# Patient Record
Sex: Female | Born: 1935
Health system: Southern US, Community
[De-identification: ages and names within clinical notes are randomized; demographics above are authoritative.]

## PROBLEM LIST (undated history)

## (undated) DIAGNOSIS — T7840XA Allergy, unspecified, initial encounter: Secondary | ICD-10-CM

## (undated) DIAGNOSIS — K219 Gastro-esophageal reflux disease without esophagitis: Secondary | ICD-10-CM

## (undated) DIAGNOSIS — F0390 Unspecified dementia without behavioral disturbance: Secondary | ICD-10-CM

## (undated) DIAGNOSIS — F419 Anxiety disorder, unspecified: Secondary | ICD-10-CM

## (undated) DIAGNOSIS — M48061 Spinal stenosis, lumbar region without neurogenic claudication: Secondary | ICD-10-CM

## (undated) DIAGNOSIS — C801 Malignant (primary) neoplasm, unspecified: Secondary | ICD-10-CM

## (undated) DIAGNOSIS — E785 Hyperlipidemia, unspecified: Secondary | ICD-10-CM

## (undated) DIAGNOSIS — D649 Anemia, unspecified: Secondary | ICD-10-CM

## (undated) DIAGNOSIS — R42 Dizziness and giddiness: Secondary | ICD-10-CM

## (undated) DIAGNOSIS — K5730 Diverticulosis of large intestine without perforation or abscess without bleeding: Secondary | ICD-10-CM

## (undated) DIAGNOSIS — M1990 Unspecified osteoarthritis, unspecified site: Secondary | ICD-10-CM

## (undated) DIAGNOSIS — I10 Essential (primary) hypertension: Secondary | ICD-10-CM

## (undated) HISTORY — DX: Unspecified dementia without behavioral disturbance: F03.90

## (undated) HISTORY — DX: Essential (primary) hypertension: I10

## (undated) HISTORY — DX: Allergy, unspecified, initial encounter: T78.40XA

## (undated) HISTORY — DX: Anemia, unspecified: D64.9

## (undated) HISTORY — PX: EYE SURGERY: SHX253

## (undated) HISTORY — DX: Hyperlipidemia, unspecified: E78.5

## (undated) HISTORY — DX: Unspecified osteoarthritis, unspecified site: M19.90

## (undated) HISTORY — DX: Gastro-esophageal reflux disease without esophagitis: K21.9

## (undated) HISTORY — PX: CARPAL TUNNEL RELEASE: SHX101

## (undated) HISTORY — DX: Spinal stenosis, lumbar region without neurogenic claudication: M48.061

## (undated) HISTORY — DX: Malignant (primary) neoplasm, unspecified: C80.1

## (undated) HISTORY — DX: Dizziness and giddiness: R42

## (undated) HISTORY — DX: Diverticulosis of large intestine without perforation or abscess without bleeding: K57.30

## (undated) HISTORY — PX: SHOULDER SURGERY: SHX246

---

## 1994-01-17 HISTORY — PX: TYMPANOPLASTY: SHX33

## 1997-03-17 HISTORY — PX: ABDOMINAL HYSTERECTOMY: SHX81

## 1997-08-10 ENCOUNTER — Emergency Department (HOSPITAL_COMMUNITY): Admission: EM | Admit: 1997-08-10 | Discharge: 1997-08-10 | Payer: Self-pay | Admitting: Emergency Medicine

## 1997-11-17 ENCOUNTER — Other Ambulatory Visit: Admission: RE | Admit: 1997-11-17 | Discharge: 1997-11-17 | Payer: Self-pay

## 1998-06-04 ENCOUNTER — Other Ambulatory Visit: Admission: RE | Admit: 1998-06-04 | Discharge: 1998-06-04 | Payer: Self-pay | Admitting: Obstetrics and Gynecology

## 1998-11-11 ENCOUNTER — Encounter: Payer: Self-pay | Admitting: Family Medicine

## 1998-11-11 ENCOUNTER — Encounter: Admission: RE | Admit: 1998-11-11 | Discharge: 1998-11-11 | Payer: Self-pay | Admitting: Family Medicine

## 1998-11-24 ENCOUNTER — Encounter: Admission: RE | Admit: 1998-11-24 | Discharge: 1998-11-24 | Payer: Self-pay | Admitting: Family Medicine

## 1998-11-24 ENCOUNTER — Encounter: Payer: Self-pay | Admitting: Family Medicine

## 1998-11-30 ENCOUNTER — Other Ambulatory Visit: Admission: RE | Admit: 1998-11-30 | Discharge: 1998-11-30 | Payer: Self-pay | Admitting: Obstetrics and Gynecology

## 1998-12-02 ENCOUNTER — Encounter (INDEPENDENT_AMBULATORY_CARE_PROVIDER_SITE_OTHER): Payer: Self-pay

## 1998-12-02 ENCOUNTER — Encounter: Payer: Self-pay | Admitting: Family Medicine

## 1998-12-02 ENCOUNTER — Ambulatory Visit (HOSPITAL_COMMUNITY): Admission: RE | Admit: 1998-12-02 | Discharge: 1998-12-02 | Payer: Self-pay | Admitting: Family Medicine

## 1998-12-18 DIAGNOSIS — Z853 Personal history of malignant neoplasm of breast: Secondary | ICD-10-CM

## 1998-12-25 ENCOUNTER — Encounter: Admission: RE | Admit: 1998-12-25 | Discharge: 1998-12-25 | Payer: Self-pay | Admitting: Surgery

## 1998-12-25 ENCOUNTER — Encounter: Payer: Self-pay | Admitting: Surgery

## 1998-12-28 ENCOUNTER — Ambulatory Visit (HOSPITAL_BASED_OUTPATIENT_CLINIC_OR_DEPARTMENT_OTHER): Admission: RE | Admit: 1998-12-28 | Discharge: 1998-12-28 | Payer: Self-pay | Admitting: Surgery

## 1998-12-28 ENCOUNTER — Encounter: Payer: Self-pay | Admitting: Surgery

## 1998-12-28 HISTORY — PX: MASTECTOMY PARTIAL / LUMPECTOMY: SUR851

## 1999-01-08 ENCOUNTER — Encounter (INDEPENDENT_AMBULATORY_CARE_PROVIDER_SITE_OTHER): Payer: Self-pay

## 1999-01-08 ENCOUNTER — Ambulatory Visit (HOSPITAL_BASED_OUTPATIENT_CLINIC_OR_DEPARTMENT_OTHER): Admission: RE | Admit: 1999-01-08 | Discharge: 1999-01-08 | Payer: Self-pay | Admitting: Surgery

## 1999-01-19 ENCOUNTER — Encounter: Admission: RE | Admit: 1999-01-19 | Discharge: 1999-04-19 | Payer: Self-pay | Admitting: Radiation Oncology

## 1999-02-11 ENCOUNTER — Ambulatory Visit (HOSPITAL_COMMUNITY): Admission: RE | Admit: 1999-02-11 | Discharge: 1999-02-11 | Payer: Self-pay | Admitting: Radiation Oncology

## 1999-06-03 ENCOUNTER — Other Ambulatory Visit: Admission: RE | Admit: 1999-06-03 | Discharge: 1999-06-03 | Payer: Self-pay | Admitting: Obstetrics and Gynecology

## 1999-08-12 ENCOUNTER — Encounter: Admission: RE | Admit: 1999-08-12 | Discharge: 1999-08-12 | Payer: Self-pay | Admitting: Surgery

## 1999-08-12 ENCOUNTER — Encounter: Payer: Self-pay | Admitting: Surgery

## 2000-01-03 ENCOUNTER — Other Ambulatory Visit: Admission: RE | Admit: 2000-01-03 | Discharge: 2000-01-03 | Payer: Self-pay | Admitting: Obstetrics and Gynecology

## 2000-02-23 ENCOUNTER — Encounter: Admission: RE | Admit: 2000-02-23 | Discharge: 2000-05-23 | Payer: Self-pay | Admitting: Family Medicine

## 2000-07-21 ENCOUNTER — Other Ambulatory Visit: Admission: RE | Admit: 2000-07-21 | Discharge: 2000-07-21 | Payer: Self-pay | Admitting: Obstetrics and Gynecology

## 2000-08-15 ENCOUNTER — Encounter: Payer: Self-pay | Admitting: Family Medicine

## 2000-08-15 ENCOUNTER — Encounter: Admission: RE | Admit: 2000-08-15 | Discharge: 2000-08-15 | Payer: Self-pay | Admitting: Family Medicine

## 2000-08-17 ENCOUNTER — Encounter: Payer: Self-pay | Admitting: Family Medicine

## 2000-08-17 ENCOUNTER — Encounter: Admission: RE | Admit: 2000-08-17 | Discharge: 2000-08-17 | Payer: Self-pay | Admitting: Family Medicine

## 2000-08-28 ENCOUNTER — Encounter: Admission: RE | Admit: 2000-08-28 | Discharge: 2000-08-28 | Payer: Self-pay | Admitting: Family Medicine

## 2000-08-28 ENCOUNTER — Encounter: Payer: Self-pay | Admitting: Family Medicine

## 2001-01-24 ENCOUNTER — Other Ambulatory Visit: Admission: RE | Admit: 2001-01-24 | Discharge: 2001-01-24 | Payer: Self-pay | Admitting: Obstetrics and Gynecology

## 2001-04-17 ENCOUNTER — Encounter: Admission: RE | Admit: 2001-04-17 | Discharge: 2001-07-16 | Payer: Self-pay | Admitting: Family Medicine

## 2001-05-21 ENCOUNTER — Ambulatory Visit (HOSPITAL_COMMUNITY): Admission: RE | Admit: 2001-05-21 | Discharge: 2001-05-21 | Payer: Self-pay | Admitting: Internal Medicine

## 2001-05-21 ENCOUNTER — Encounter: Payer: Self-pay | Admitting: Internal Medicine

## 2001-05-24 ENCOUNTER — Ambulatory Visit (HOSPITAL_COMMUNITY): Admission: RE | Admit: 2001-05-24 | Discharge: 2001-05-24 | Payer: Self-pay | Admitting: Internal Medicine

## 2001-05-28 ENCOUNTER — Encounter: Payer: Self-pay | Admitting: Internal Medicine

## 2001-05-28 ENCOUNTER — Ambulatory Visit (HOSPITAL_COMMUNITY): Admission: RE | Admit: 2001-05-28 | Discharge: 2001-05-28 | Payer: Self-pay | Admitting: Internal Medicine

## 2001-07-25 ENCOUNTER — Other Ambulatory Visit: Admission: RE | Admit: 2001-07-25 | Discharge: 2001-07-25 | Payer: Self-pay | Admitting: Obstetrics and Gynecology

## 2001-08-16 ENCOUNTER — Encounter: Payer: Self-pay | Admitting: Family Medicine

## 2001-08-16 ENCOUNTER — Encounter: Admission: RE | Admit: 2001-08-16 | Discharge: 2001-08-16 | Payer: Self-pay | Admitting: Family Medicine

## 2002-02-20 ENCOUNTER — Other Ambulatory Visit: Admission: RE | Admit: 2002-02-20 | Discharge: 2002-02-20 | Payer: Self-pay | Admitting: Obstetrics and Gynecology

## 2002-03-20 ENCOUNTER — Encounter: Payer: Self-pay | Admitting: Orthopedic Surgery

## 2002-03-20 ENCOUNTER — Encounter: Admission: RE | Admit: 2002-03-20 | Discharge: 2002-03-20 | Payer: Self-pay | Admitting: Orthopedic Surgery

## 2002-03-21 ENCOUNTER — Ambulatory Visit (HOSPITAL_BASED_OUTPATIENT_CLINIC_OR_DEPARTMENT_OTHER): Admission: RE | Admit: 2002-03-21 | Discharge: 2002-03-22 | Payer: Self-pay | Admitting: Orthopedic Surgery

## 2002-03-21 ENCOUNTER — Encounter (INDEPENDENT_AMBULATORY_CARE_PROVIDER_SITE_OTHER): Payer: Self-pay | Admitting: *Deleted

## 2002-06-20 ENCOUNTER — Ambulatory Visit (HOSPITAL_BASED_OUTPATIENT_CLINIC_OR_DEPARTMENT_OTHER): Admission: RE | Admit: 2002-06-20 | Discharge: 2002-06-20 | Payer: Self-pay | Admitting: Orthopedic Surgery

## 2003-02-04 ENCOUNTER — Encounter: Admission: RE | Admit: 2003-02-04 | Discharge: 2003-02-04 | Payer: Self-pay | Admitting: Family Medicine

## 2003-06-23 ENCOUNTER — Encounter: Admission: RE | Admit: 2003-06-23 | Discharge: 2003-06-23 | Payer: Self-pay | Admitting: Family Medicine

## 2003-09-04 ENCOUNTER — Other Ambulatory Visit: Admission: RE | Admit: 2003-09-04 | Discharge: 2003-09-04 | Payer: Self-pay | Admitting: Obstetrics and Gynecology

## 2003-09-10 ENCOUNTER — Emergency Department (HOSPITAL_COMMUNITY): Admission: AC | Admit: 2003-09-10 | Discharge: 2003-09-10 | Payer: Self-pay

## 2004-01-13 ENCOUNTER — Ambulatory Visit: Payer: Self-pay | Admitting: Family Medicine

## 2004-02-11 ENCOUNTER — Ambulatory Visit: Payer: Self-pay | Admitting: Family Medicine

## 2004-06-01 ENCOUNTER — Encounter: Admission: RE | Admit: 2004-06-01 | Discharge: 2004-06-01 | Payer: Self-pay | Admitting: Family Medicine

## 2004-06-01 ENCOUNTER — Ambulatory Visit: Payer: Self-pay | Admitting: Family Medicine

## 2004-06-15 ENCOUNTER — Encounter: Payer: Self-pay | Admitting: Family Medicine

## 2004-06-29 ENCOUNTER — Ambulatory Visit: Payer: Self-pay | Admitting: Family Medicine

## 2004-08-05 ENCOUNTER — Ambulatory Visit: Payer: Self-pay | Admitting: Family Medicine

## 2004-08-09 ENCOUNTER — Ambulatory Visit: Payer: Self-pay | Admitting: Family Medicine

## 2004-08-11 ENCOUNTER — Ambulatory Visit: Payer: Self-pay | Admitting: Family Medicine

## 2004-08-16 ENCOUNTER — Encounter: Admission: RE | Admit: 2004-08-16 | Discharge: 2004-08-16 | Payer: Self-pay | Admitting: Family Medicine

## 2004-08-19 ENCOUNTER — Ambulatory Visit: Payer: Self-pay | Admitting: Family Medicine

## 2004-08-25 ENCOUNTER — Encounter: Admission: RE | Admit: 2004-08-25 | Discharge: 2004-08-25 | Payer: Self-pay | Admitting: Family Medicine

## 2004-09-06 ENCOUNTER — Other Ambulatory Visit: Admission: RE | Admit: 2004-09-06 | Discharge: 2004-09-06 | Payer: Self-pay | Admitting: Obstetrics and Gynecology

## 2004-10-18 ENCOUNTER — Ambulatory Visit: Payer: Self-pay | Admitting: Family Medicine

## 2004-10-22 ENCOUNTER — Encounter: Admission: RE | Admit: 2004-10-22 | Discharge: 2004-10-22 | Payer: Self-pay | Admitting: Family Medicine

## 2004-11-09 ENCOUNTER — Ambulatory Visit: Payer: Self-pay | Admitting: Family Medicine

## 2004-12-15 ENCOUNTER — Ambulatory Visit: Payer: Self-pay | Admitting: Family Medicine

## 2004-12-29 ENCOUNTER — Ambulatory Visit: Payer: Self-pay | Admitting: Family Medicine

## 2005-03-31 ENCOUNTER — Ambulatory Visit: Payer: Self-pay | Admitting: Family Medicine

## 2005-07-12 ENCOUNTER — Ambulatory Visit: Payer: Self-pay | Admitting: Family Medicine

## 2005-08-02 ENCOUNTER — Ambulatory Visit: Payer: Self-pay | Admitting: Family Medicine

## 2005-08-11 ENCOUNTER — Ambulatory Visit: Payer: Self-pay | Admitting: Family Medicine

## 2005-09-06 ENCOUNTER — Encounter: Admission: RE | Admit: 2005-09-06 | Discharge: 2005-09-06 | Payer: Self-pay | Admitting: Family Medicine

## 2005-09-15 ENCOUNTER — Other Ambulatory Visit: Admission: RE | Admit: 2005-09-15 | Discharge: 2005-09-15 | Payer: Self-pay | Admitting: Obstetrics and Gynecology

## 2005-11-03 ENCOUNTER — Ambulatory Visit: Payer: Self-pay | Admitting: Family Medicine

## 2005-11-24 ENCOUNTER — Ambulatory Visit: Payer: Self-pay | Admitting: Internal Medicine

## 2006-02-01 ENCOUNTER — Ambulatory Visit: Payer: Self-pay | Admitting: Family Medicine

## 2006-02-01 LAB — CONVERTED CEMR LAB
ALT: 25 units/L (ref 0–40)
AST: 26 units/L (ref 0–37)
Albumin: 3.8 g/dL (ref 3.5–5.2)
BUN: 8 mg/dL (ref 6–23)
CO2: 30 meq/L (ref 19–32)
Calcium: 9.6 mg/dL (ref 8.4–10.5)
Chloride: 96 meq/L (ref 96–112)
Cholesterol: 175 mg/dL (ref 0–200)
Creatinine, Ser: 0.9 mg/dL (ref 0.4–1.2)
GFR calc Af Amer: 78 mL/min
GFR calc non Af Amer: 65 mL/min
Glucose, Bld: 104 mg/dL — ABNORMAL HIGH (ref 70–99)
HDL: 37.6 mg/dL — ABNORMAL LOW (ref >39.0)
Hgb A1c MFr Bld: 6.6 %
Hgb A1c MFr Bld: 6.6 % — ABNORMAL HIGH (ref 4.6–6.0)
LDL Cholesterol: 112 mg/dL — ABNORMAL HIGH (ref 0–99)
Phosphorus: 3 mg/dL (ref 2.3–4.6)
Potassium: 3.9 meq/L (ref 3.5–5.1)
Sodium: 133 meq/L — ABNORMAL LOW (ref 135–145)
Total CHOL/HDL Ratio: 4.7
Triglycerides: 127 mg/dL (ref 0–149)
VLDL: 25 mg/dL (ref 0–40)

## 2006-03-08 ENCOUNTER — Ambulatory Visit: Payer: Self-pay | Admitting: Family Medicine

## 2006-07-10 ENCOUNTER — Ambulatory Visit: Payer: Self-pay | Admitting: Internal Medicine

## 2006-07-10 DIAGNOSIS — Z8739 Personal history of other diseases of the musculoskeletal system and connective tissue: Secondary | ICD-10-CM

## 2006-07-11 ENCOUNTER — Encounter: Payer: Self-pay | Admitting: Family Medicine

## 2006-07-11 DIAGNOSIS — E785 Hyperlipidemia, unspecified: Secondary | ICD-10-CM

## 2006-07-11 DIAGNOSIS — K219 Gastro-esophageal reflux disease without esophagitis: Secondary | ICD-10-CM

## 2006-07-11 DIAGNOSIS — K5730 Diverticulosis of large intestine without perforation or abscess without bleeding: Secondary | ICD-10-CM | POA: Insufficient documentation

## 2006-07-11 DIAGNOSIS — E119 Type 2 diabetes mellitus without complications: Secondary | ICD-10-CM | POA: Insufficient documentation

## 2006-07-11 DIAGNOSIS — H269 Unspecified cataract: Secondary | ICD-10-CM

## 2006-07-11 DIAGNOSIS — I10 Essential (primary) hypertension: Secondary | ICD-10-CM

## 2006-07-11 DIAGNOSIS — M1990 Unspecified osteoarthritis, unspecified site: Secondary | ICD-10-CM | POA: Insufficient documentation

## 2006-07-11 DIAGNOSIS — J309 Allergic rhinitis, unspecified: Secondary | ICD-10-CM | POA: Insufficient documentation

## 2006-07-11 DIAGNOSIS — J45909 Unspecified asthma, uncomplicated: Secondary | ICD-10-CM | POA: Insufficient documentation

## 2006-07-19 ENCOUNTER — Ambulatory Visit: Payer: Self-pay | Admitting: Family Medicine

## 2006-07-19 DIAGNOSIS — M129 Arthropathy, unspecified: Secondary | ICD-10-CM | POA: Insufficient documentation

## 2006-07-24 LAB — CONVERTED CEMR LAB: Sed Rate: 42 mm/hr — ABNORMAL HIGH (ref 0–25)

## 2006-07-27 ENCOUNTER — Telehealth (INDEPENDENT_AMBULATORY_CARE_PROVIDER_SITE_OTHER): Payer: Self-pay | Admitting: *Deleted

## 2006-08-07 ENCOUNTER — Telehealth (INDEPENDENT_AMBULATORY_CARE_PROVIDER_SITE_OTHER): Payer: Self-pay | Admitting: *Deleted

## 2006-08-22 ENCOUNTER — Ambulatory Visit: Payer: Self-pay | Admitting: Family Medicine

## 2006-08-22 DIAGNOSIS — M353 Polymyalgia rheumatica: Secondary | ICD-10-CM | POA: Insufficient documentation

## 2006-09-08 ENCOUNTER — Encounter: Payer: Self-pay | Admitting: Family Medicine

## 2006-09-14 ENCOUNTER — Encounter: Payer: Self-pay | Admitting: Family Medicine

## 2006-09-19 ENCOUNTER — Encounter: Payer: Self-pay | Admitting: Family Medicine

## 2006-09-22 ENCOUNTER — Encounter: Admission: RE | Admit: 2006-09-22 | Discharge: 2006-10-20 | Payer: Self-pay | Admitting: Neurology

## 2006-09-27 ENCOUNTER — Ambulatory Visit: Payer: Self-pay | Admitting: Family Medicine

## 2006-09-28 LAB — CONVERTED CEMR LAB: Sed Rate: 29 mm/hr — ABNORMAL HIGH (ref 0–25)

## 2006-11-08 ENCOUNTER — Ambulatory Visit: Payer: Self-pay | Admitting: Family Medicine

## 2006-12-11 ENCOUNTER — Encounter: Payer: Self-pay | Admitting: Family Medicine

## 2007-01-18 HISTORY — PX: BACK SURGERY: SHX140

## 2007-02-20 ENCOUNTER — Ambulatory Visit: Payer: Self-pay | Admitting: Family Medicine

## 2007-02-20 DIAGNOSIS — R42 Dizziness and giddiness: Secondary | ICD-10-CM

## 2007-02-23 LAB — CONVERTED CEMR LAB
Hgb A1c MFr Bld: 6.7 % — ABNORMAL HIGH (ref 4.6–6.0)
Sed Rate: 22 mm/hr (ref 0–25)

## 2007-04-24 ENCOUNTER — Ambulatory Visit: Payer: Self-pay | Admitting: Family Medicine

## 2007-05-18 DIAGNOSIS — M48061 Spinal stenosis, lumbar region without neurogenic claudication: Secondary | ICD-10-CM

## 2007-05-18 HISTORY — DX: Spinal stenosis, lumbar region without neurogenic claudication: M48.061

## 2007-05-31 ENCOUNTER — Encounter: Payer: Self-pay | Admitting: Family Medicine

## 2007-06-14 ENCOUNTER — Encounter: Payer: Self-pay | Admitting: Family Medicine

## 2007-06-18 ENCOUNTER — Ambulatory Visit: Payer: Self-pay | Admitting: Family Medicine

## 2007-07-02 ENCOUNTER — Ambulatory Visit: Payer: Self-pay | Admitting: Family Medicine

## 2007-07-27 ENCOUNTER — Encounter: Payer: Self-pay | Admitting: Family Medicine

## 2007-08-02 ENCOUNTER — Inpatient Hospital Stay (HOSPITAL_COMMUNITY): Admission: RE | Admit: 2007-08-02 | Discharge: 2007-08-06 | Payer: Self-pay | Admitting: Specialist

## 2007-08-02 ENCOUNTER — Encounter (INDEPENDENT_AMBULATORY_CARE_PROVIDER_SITE_OTHER): Payer: Self-pay | Admitting: Specialist

## 2007-08-06 ENCOUNTER — Encounter: Payer: Self-pay | Admitting: Family Medicine

## 2007-08-22 ENCOUNTER — Ambulatory Visit: Payer: Self-pay | Admitting: Family Medicine

## 2007-09-07 ENCOUNTER — Telehealth: Payer: Self-pay | Admitting: Family Medicine

## 2007-09-17 ENCOUNTER — Ambulatory Visit: Payer: Self-pay | Admitting: Family Medicine

## 2007-09-17 DIAGNOSIS — F0390 Unspecified dementia without behavioral disturbance: Secondary | ICD-10-CM | POA: Insufficient documentation

## 2007-09-18 ENCOUNTER — Telehealth: Payer: Self-pay | Admitting: Family Medicine

## 2007-09-19 ENCOUNTER — Encounter: Payer: Self-pay | Admitting: Family Medicine

## 2007-09-19 LAB — CONVERTED CEMR LAB: Vit D, 1,25-Dihydroxy: 70 (ref 30–89)

## 2007-09-26 ENCOUNTER — Ambulatory Visit: Payer: Self-pay | Admitting: Family Medicine

## 2007-09-26 DIAGNOSIS — D649 Anemia, unspecified: Secondary | ICD-10-CM | POA: Insufficient documentation

## 2007-09-26 LAB — CONVERTED CEMR LAB
Albumin: 4.1 g/dL (ref 3.5–5.2)
Basophils Absolute: 0 10*3/uL (ref 0.0–0.1)
CO2: 29 meq/L (ref 19–32)
Chloride: 104 meq/L (ref 96–112)
Eosinophils Absolute: 0.3 10*3/uL (ref 0.0–0.7)
Folate: 15.1 ng/mL
GFR calc non Af Amer: 86 mL/min
HCT: 31.6 % — ABNORMAL LOW (ref 36.0–46.0)
Iron: 33 ug/dL — ABNORMAL LOW (ref 42–145)
MCV: 94.6 fL (ref 78.0–100.0)
Monocytes Absolute: 0.5 10*3/uL (ref 0.1–1.0)
Platelets: 253 10*3/uL (ref 150–400)
RDW: 12.2 % (ref 11.5–14.6)
Saturation Ratios: 9.6 % — ABNORMAL LOW (ref 20.0–50.0)
Sed Rate: 32 mm/hr — ABNORMAL HIGH (ref 0–22)
Sodium: 137 meq/L (ref 135–145)
Vitamin B-12: 250 pg/mL (ref 211–911)

## 2007-10-04 ENCOUNTER — Ambulatory Visit: Payer: Self-pay | Admitting: Family Medicine

## 2007-10-04 LAB — CONVERTED CEMR LAB
OCCULT 1: NEGATIVE
OCCULT 2: NEGATIVE
OCCULT 3: NEGATIVE

## 2007-11-06 ENCOUNTER — Telehealth: Payer: Self-pay | Admitting: Family Medicine

## 2007-11-13 ENCOUNTER — Encounter (INDEPENDENT_AMBULATORY_CARE_PROVIDER_SITE_OTHER): Payer: Self-pay | Admitting: *Deleted

## 2007-11-19 ENCOUNTER — Encounter: Payer: Self-pay | Admitting: Family Medicine

## 2007-11-20 ENCOUNTER — Encounter (INDEPENDENT_AMBULATORY_CARE_PROVIDER_SITE_OTHER): Payer: Self-pay | Admitting: *Deleted

## 2007-11-22 ENCOUNTER — Ambulatory Visit: Payer: Self-pay | Admitting: Family Medicine

## 2007-11-26 LAB — CONVERTED CEMR LAB
Basophils Absolute: 0.1 10*3/uL (ref 0.0–0.1)
Basophils Relative: 1.6 % (ref 0.0–3.0)
Eosinophils Relative: 5 % (ref 0.0–5.0)
Hemoglobin: 12.9 g/dL (ref 12.0–15.0)
Lymphocytes Relative: 26.4 % (ref 12.0–46.0)
MCHC: 34.8 g/dL (ref 30.0–36.0)
MCV: 94 fL (ref 78.0–100.0)
Neutro Abs: 2.9 10*3/uL (ref 1.4–7.7)
RBC: 3.94 M/uL (ref 3.87–5.11)
WBC: 5.3 10*3/uL (ref 4.5–10.5)

## 2007-12-27 ENCOUNTER — Encounter: Payer: Self-pay | Admitting: Family Medicine

## 2007-12-27 ENCOUNTER — Ambulatory Visit: Payer: Self-pay | Admitting: Internal Medicine

## 2008-02-04 ENCOUNTER — Telehealth: Payer: Self-pay | Admitting: Family Medicine

## 2008-02-08 ENCOUNTER — Telehealth: Payer: Self-pay | Admitting: Family Medicine

## 2008-02-13 ENCOUNTER — Encounter (INDEPENDENT_AMBULATORY_CARE_PROVIDER_SITE_OTHER): Payer: Self-pay | Admitting: *Deleted

## 2008-04-04 ENCOUNTER — Encounter: Payer: Self-pay | Admitting: Family Medicine

## 2008-04-21 ENCOUNTER — Encounter: Admission: RE | Admit: 2008-04-21 | Discharge: 2008-07-20 | Payer: Self-pay | Admitting: Neurology

## 2008-05-22 ENCOUNTER — Telehealth: Payer: Self-pay | Admitting: Family Medicine

## 2008-06-23 ENCOUNTER — Encounter: Payer: Self-pay | Admitting: Family Medicine

## 2008-07-17 LAB — HM DIABETES EYE EXAM: HM Diabetic Eye Exam: NORMAL

## 2008-07-18 ENCOUNTER — Telehealth: Payer: Self-pay | Admitting: Family Medicine

## 2008-11-14 ENCOUNTER — Encounter (INDEPENDENT_AMBULATORY_CARE_PROVIDER_SITE_OTHER): Payer: Self-pay | Admitting: *Deleted

## 2009-01-08 ENCOUNTER — Ambulatory Visit: Payer: Self-pay | Admitting: Family Medicine

## 2009-01-12 LAB — CONVERTED CEMR LAB
Albumin: 4 g/dL (ref 3.5–5.2)
Alkaline Phosphatase: 53 units/L (ref 39–117)
BUN: 11 mg/dL (ref 6–23)
Basophils Absolute: 0.1 10*3/uL (ref 0.0–0.1)
Bilirubin, Direct: 0 mg/dL (ref 0.0–0.3)
Calcium: 9.4 mg/dL (ref 8.4–10.5)
Cholesterol: 192 mg/dL (ref 0–200)
Creatinine, Ser: 0.8 mg/dL (ref 0.4–1.2)
Eosinophils Absolute: 0.1 10*3/uL (ref 0.0–0.7)
Glucose, Bld: 87 mg/dL (ref 70–99)
HDL: 39.1 mg/dL (ref >39.00)
LDL Cholesterol: 125 mg/dL — ABNORMAL HIGH (ref 0–99)
Lymphocytes Relative: 28.3 % (ref 12.0–46.0)
MCHC: 33.9 g/dL (ref 30.0–36.0)
MCV: 96.5 fL (ref 78.0–100.0)
Monocytes Absolute: 0.6 10*3/uL (ref 0.1–1.0)
Neutrophils Relative %: 60.6 % (ref 43.0–77.0)
Phosphorus: 3.7 mg/dL (ref 2.3–4.6)
Platelets: 213 10*3/uL (ref 150.0–400.0)
Potassium: 3.7 meq/L (ref 3.5–5.1)
RDW: 12.6 % (ref 11.5–14.6)
Total Protein: 7.1 g/dL (ref 6.0–8.3)
VLDL: 28.2 mg/dL (ref 0.0–40.0)

## 2009-02-09 ENCOUNTER — Ambulatory Visit: Payer: Self-pay | Admitting: Family Medicine

## 2009-02-10 ENCOUNTER — Encounter: Payer: Self-pay | Admitting: Family Medicine

## 2009-02-19 ENCOUNTER — Encounter: Admission: RE | Admit: 2009-02-19 | Discharge: 2009-03-20 | Payer: Self-pay | Admitting: Neurology

## 2009-03-23 ENCOUNTER — Ambulatory Visit: Payer: Self-pay | Admitting: Family Medicine

## 2009-03-26 LAB — CONVERTED CEMR LAB
Cholesterol: 168 mg/dL (ref 0–200)
HDL: 43.6 mg/dL (ref >39.00)
LDL Cholesterol: 92 mg/dL (ref 0–99)
Total CHOL/HDL Ratio: 4
Triglycerides: 161 mg/dL — ABNORMAL HIGH (ref 0.0–149.0)

## 2009-04-16 ENCOUNTER — Ambulatory Visit: Payer: Self-pay | Admitting: Family Medicine

## 2009-04-17 ENCOUNTER — Encounter: Admission: RE | Admit: 2009-04-17 | Discharge: 2009-04-17 | Payer: Self-pay | Admitting: Family Medicine

## 2009-04-20 ENCOUNTER — Encounter (INDEPENDENT_AMBULATORY_CARE_PROVIDER_SITE_OTHER): Payer: Self-pay | Admitting: *Deleted

## 2009-04-21 ENCOUNTER — Telehealth: Payer: Self-pay | Admitting: Family Medicine

## 2009-04-22 ENCOUNTER — Encounter: Payer: Self-pay | Admitting: Family Medicine

## 2009-05-04 ENCOUNTER — Encounter: Payer: Self-pay | Admitting: Family Medicine

## 2009-05-13 ENCOUNTER — Telehealth: Payer: Self-pay | Admitting: Family Medicine

## 2009-05-27 ENCOUNTER — Ambulatory Visit: Payer: Self-pay | Admitting: Family Medicine

## 2009-05-27 ENCOUNTER — Telehealth: Payer: Self-pay | Admitting: Family Medicine

## 2009-06-04 ENCOUNTER — Encounter: Payer: Self-pay | Admitting: Family Medicine

## 2009-06-19 ENCOUNTER — Telehealth: Payer: Self-pay | Admitting: Family Medicine

## 2009-08-06 ENCOUNTER — Ambulatory Visit: Payer: Self-pay | Admitting: Family Medicine

## 2009-08-06 LAB — CONVERTED CEMR LAB
AST: 24 units/L (ref 0–37)
BUN: 14 mg/dL (ref 6–23)
CO2: 32 meq/L (ref 19–32)
Calcium: 9.4 mg/dL (ref 8.4–10.5)
Cholesterol: 198 mg/dL (ref 0–200)
Creatinine, Ser: 0.8 mg/dL (ref 0.4–1.2)
Glucose, Bld: 114 mg/dL — ABNORMAL HIGH (ref 70–99)
LDL Cholesterol: 137 mg/dL — ABNORMAL HIGH (ref 0–99)
Sodium: 141 meq/L (ref 135–145)
Total CHOL/HDL Ratio: 6
Triglycerides: 128 mg/dL (ref 0.0–149.0)
VLDL: 25.6 mg/dL (ref 0.0–40.0)

## 2009-08-07 ENCOUNTER — Telehealth: Payer: Self-pay | Admitting: Family Medicine

## 2009-08-10 ENCOUNTER — Ambulatory Visit: Payer: Self-pay | Admitting: Family Medicine

## 2009-08-10 LAB — HM DIABETES FOOT EXAM

## 2009-08-17 ENCOUNTER — Ambulatory Visit: Admission: RE | Admit: 2009-08-17 | Discharge: 2009-09-28 | Payer: Self-pay | Admitting: Radiation Oncology

## 2009-08-18 ENCOUNTER — Encounter: Payer: Self-pay | Admitting: Family Medicine

## 2009-08-27 ENCOUNTER — Encounter: Admission: RE | Admit: 2009-08-27 | Discharge: 2009-08-27 | Payer: Self-pay | Admitting: Radiation Oncology

## 2009-09-24 ENCOUNTER — Encounter: Payer: Self-pay | Admitting: Family Medicine

## 2009-09-30 ENCOUNTER — Telehealth: Payer: Self-pay | Admitting: Family Medicine

## 2009-10-12 ENCOUNTER — Encounter: Payer: Self-pay | Admitting: Family Medicine

## 2009-10-12 ENCOUNTER — Telehealth: Payer: Self-pay | Admitting: Family Medicine

## 2009-10-16 ENCOUNTER — Ambulatory Visit (HOSPITAL_COMMUNITY): Admission: RE | Admit: 2009-10-16 | Discharge: 2009-10-17 | Payer: Self-pay | Admitting: Surgery

## 2009-10-16 ENCOUNTER — Telehealth: Payer: Self-pay | Admitting: Family Medicine

## 2009-10-16 ENCOUNTER — Encounter (INDEPENDENT_AMBULATORY_CARE_PROVIDER_SITE_OTHER): Payer: Self-pay | Admitting: Surgery

## 2009-10-16 HISTORY — PX: INCISIONAL BREAST BIOPSY: SHX1812

## 2009-10-18 ENCOUNTER — Emergency Department (HOSPITAL_COMMUNITY)
Admission: EM | Admit: 2009-10-18 | Discharge: 2009-10-18 | Payer: Self-pay | Source: Home / Self Care | Admitting: Family Medicine

## 2009-10-20 ENCOUNTER — Emergency Department (HOSPITAL_COMMUNITY): Admission: EM | Admit: 2009-10-20 | Discharge: 2009-10-20 | Payer: Self-pay | Admitting: Family Medicine

## 2009-10-28 ENCOUNTER — Encounter: Payer: Self-pay | Admitting: Family Medicine

## 2009-10-28 ENCOUNTER — Ambulatory Visit (HOSPITAL_COMMUNITY): Admission: RE | Admit: 2009-10-28 | Discharge: 2009-10-28 | Payer: Self-pay | Admitting: Surgery

## 2009-11-05 ENCOUNTER — Ambulatory Visit (HOSPITAL_COMMUNITY): Admission: RE | Admit: 2009-11-05 | Discharge: 2009-11-05 | Payer: Self-pay | Admitting: Surgery

## 2009-11-11 ENCOUNTER — Ambulatory Visit: Payer: Self-pay | Admitting: Oncology

## 2009-11-11 ENCOUNTER — Encounter: Payer: Self-pay | Admitting: Family Medicine

## 2009-11-11 ENCOUNTER — Encounter (INDEPENDENT_AMBULATORY_CARE_PROVIDER_SITE_OTHER): Payer: Self-pay | Admitting: *Deleted

## 2009-11-11 LAB — CBC WITH DIFFERENTIAL/PLATELET
Eosinophils Absolute: 0.1 10*3/uL (ref 0.0–0.5)
MCV: 92.7 fL (ref 79.5–101.0)
MONO%: 11.2 % (ref 0.0–14.0)
NEUT#: 3.1 10*3/uL (ref 1.5–6.5)
RBC: 3.72 10*6/uL (ref 3.70–5.45)
RDW: 13.7 % (ref 11.2–14.5)
WBC: 6.1 10*3/uL (ref 3.9–10.3)
lymph#: 2.2 10*3/uL (ref 0.9–3.3)

## 2009-11-11 LAB — COMPREHENSIVE METABOLIC PANEL
AST: 29 U/L (ref 0–37)
Albumin: 3.8 g/dL (ref 3.5–5.2)
Alkaline Phosphatase: 59 U/L (ref 39–117)
Calcium: 9.1 mg/dL (ref 8.4–10.5)
Chloride: 101 mEq/L (ref 96–112)
Glucose, Bld: 112 mg/dL — ABNORMAL HIGH (ref 70–99)
Potassium: 3.8 mEq/L (ref 3.5–5.3)
Sodium: 137 mEq/L (ref 135–145)
Total Protein: 7.4 g/dL (ref 6.0–8.3)

## 2009-11-11 LAB — CANCER ANTIGEN 27.29: CA 27.29: 26 U/mL (ref 0–39)

## 2009-11-30 ENCOUNTER — Encounter: Payer: Self-pay | Admitting: Family Medicine

## 2009-12-14 ENCOUNTER — Inpatient Hospital Stay (HOSPITAL_COMMUNITY)
Admission: RE | Admit: 2009-12-14 | Discharge: 2009-12-18 | Disposition: A | Discharge status: Other Acute Inpatient Hospital | Payer: Self-pay | Source: Home / Self Care | Admitting: Surgery

## 2009-12-14 ENCOUNTER — Encounter (INDEPENDENT_AMBULATORY_CARE_PROVIDER_SITE_OTHER): Payer: Self-pay | Admitting: Surgery

## 2009-12-14 HISTORY — PX: MASTECTOMY W/ NODES PARTIAL: SUR854

## 2009-12-25 ENCOUNTER — Ambulatory Visit: Payer: Self-pay | Admitting: Oncology

## 2010-01-07 HISTORY — PX: MASTECTOMY PARTIAL / LUMPECTOMY: SUR851

## 2010-01-13 ENCOUNTER — Ambulatory Visit: Payer: Self-pay | Admitting: Family Medicine

## 2010-01-17 DIAGNOSIS — F0390 Unspecified dementia without behavioral disturbance: Secondary | ICD-10-CM

## 2010-01-17 HISTORY — DX: Unspecified dementia, unspecified severity, without behavioral disturbance, psychotic disturbance, mood disturbance, and anxiety: F03.90

## 2010-02-05 ENCOUNTER — Ambulatory Visit: Payer: Self-pay | Admitting: Oncology

## 2010-02-07 ENCOUNTER — Encounter: Payer: Self-pay | Admitting: Oncology

## 2010-02-14 LAB — CONVERTED CEMR LAB
Albumin: 4.2 g/dL (ref 3.5–5.2)
Alkaline Phosphatase: 58 units/L (ref 39–117)
Basophils Relative: 0.9 % (ref 0.0–1.0)
Calcium: 9.8 mg/dL (ref 8.4–10.5)
Chloride: 96 meq/L (ref 96–112)
Creatinine, Ser: 0.9 mg/dL (ref 0.4–1.2)
Eosinophils Absolute: 0.1 10*3/uL (ref 0.0–0.7)
Eosinophils Relative: 0.9 % (ref 0.0–5.0)
GFR calc Af Amer: 78 mL/min
GFR calc non Af Amer: 64 mL/min
HCT: 34.6 % — ABNORMAL LOW (ref 36.0–46.0)
Hemoglobin: 12.4 g/dL (ref 12.0–15.0)
MCV: 94.7 fL (ref 78.0–100.0)
Monocytes Absolute: 0.7 10*3/uL (ref 0.1–1.0)
Neutrophils Relative %: 62.4 % (ref 43.0–77.0)
RBC: 3.66 M/uL — ABNORMAL LOW (ref 3.87–5.11)
Total Protein: 7.4 g/dL (ref 6.0–8.3)
WBC: 5.9 10*3/uL (ref 4.5–10.5)

## 2010-02-16 ENCOUNTER — Encounter: Payer: Self-pay | Admitting: Family Medicine

## 2010-02-16 LAB — CBC WITH DIFFERENTIAL/PLATELET
BASO%: 0.7 % (ref 0.0–2.0)
EOS%: 2.2 % (ref 0.0–7.0)
HCT: 35.2 % (ref 34.8–46.6)
LYMPH%: 29.3 % (ref 14.0–49.7)
MCH: 30.4 pg (ref 25.1–34.0)
MCHC: 34.1 g/dL (ref 31.5–36.0)
MONO%: 9.9 % (ref 0.0–14.0)
NEUT%: 57.9 % (ref 38.4–76.8)
Platelets: 248 10*3/uL (ref 145–400)

## 2010-02-17 ENCOUNTER — Encounter: Payer: Self-pay | Admitting: Oncology

## 2010-02-18 NOTE — Progress Notes (Signed)
Summary: refill request for mobic  Phone Note Refill Request Message from:  Fax from Pharmacy  Refills Requested: Medication #1:  MOBIC 15 MG TABS take one tablet by mouth once daily with a meal as needed for pain.   Last Refilled: 04/22/2009 Faxed request from [place], [phone].  Initial call taken by: Lowella Petties CMA,  May 27, 2009 2:49 PM  Follow-up for Phone Call        can refil times one  Follow-up by: Judith Part MD,  May 27, 2009 3:37 PM  Additional Follow-up for Phone Call Additional follow up Details #1::        Medication phoned to [facility] pharmacy (spoke with Rob) as instructed. Lewanda Rife LPN  May 27, 2009 4:54 PM

## 2010-02-18 NOTE — Letter (Signed)
Summary: Results Follow up Letter  [place] [facility]  [address] [place], [place] [address]   Phone: [phone]  Fax: [phone]    04/20/2009 MRN: [id number]    Courtney Castillo [address] [place], [place]  [address]    Dear [name],  The following are the results of your recent test(s):  Test         Result    Pap Smear:        Normal _____  Not Normal _____ Comments: ______________________________________________________ Cholesterol: LDL(Bad cholesterol):         Your goal is less than:         HDL (Good cholesterol):       Your goal is more than: Comments:  ______________________________________________________ Mammogram:        Normal __X___  Not Normal _____ Comments:  Yearly follow up is recommended.   ___________________________________________________________________ Hemoccult:        Normal _____  Not normal _______ Comments:    _____________________________________________________________________ Other Tests:    We routinely do not discuss normal results over the telephone.  If you desire a copy of the results, or you have any questions about this information we can discuss them at your next office visit.   Sincerely,    Marne A. Milinda Antis, M.D.  MAT:lsf

## 2010-02-18 NOTE — Consult Note (Signed)
Summary: [facility]  [facility]   Imported By: Lanelle Bal 06/24/2009 08:37:11  _____________________________________________________________________  External Attachment:    Type:   Image     Comment:   External Document

## 2010-02-18 NOTE — Miscellaneous (Signed)
Summary: Mobic 15mg  update  Medications Added ADULT ASPIRIN LOW STRENGTH 81 MG  TBDP (ASPIRIN) take one by mouth daily MOBIC 15 MG TABS (MELOXICAM) take one tablet by mouth once daily with a meal as needed for pain       Clinical Lists Changes  Medications: Added new medication of MOBIC 15 MG TABS (MELOXICAM) take one tablet by mouth once daily with a meal as needed for pain Changed medication from ADULT ASPIRIN LOW STRENGTH 81 MG  TBDP (ASPIRIN) take one by mouth daily to ADULT ASPIRIN LOW STRENGTH 81 MG  TBDP (ASPIRIN) take one by mouth daily     Current Allergies: ! SEPTRA DS

## 2010-02-18 NOTE — Letter (Signed)
Summary: [facility]   Imported By: Lanelle Bal 11/26/2009 09:10:51  _____________________________________________________________________  External Attachment:    Type:   Image     Comment:   External Document

## 2010-02-18 NOTE — Assessment & Plan Note (Signed)
Summary: pain in breast x 1 month/ alc   Vital Signs:  Patient profile:   77 year old female Height:      58.25 inches Weight:      159.25 pounds BMI:     33.12 Temp:     98.4 degrees F oral Pulse rate:   68 / minute Pulse rhythm:   regular BP sitting:   138 / 74  (left arm) Cuff size:   regular  Vitals Entered By: Lewanda Rife LPN (April 16, 2009 12:01 PM) CC: Pain in rt breast and under rt arm    History of Present Illness: pain in R breast and under arm -- wakes her up at night aching  uses a medicated powder- ? name of it otc   no rash or masses that she can feel  going on over a month  that is the side of her breast cancer  has been a while since she had a mammogram -- 2007 -- did not know she still needed them  had high grade interductal carcinoma -- with ? lumpectomy times 2 and radiation   does not go to any cancer specialists for years   no numbness or tingling no neck pain no weakness in arm no fever or other symptoms     Allergies: 1)  ! 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 anemia   sdm to skilled nursing facility 8/09- for PT and OT after back sx   orthopedics--Dr Shelle Iron  Past Surgical History: Last updated: 02/12/2008 Carpal tunnel release Cataract extraction Hysterectomy / BSO- stage 1-B adenocarcinoma of endometrium (03/1997) Breast biopsy- high grade inroductal carcinoma Breast surgery x 2- radiation [place]- fatty liver (08/2000) Dexa- osteopenia hip (12/2000)  no change (06/2003) Colonoscopy- divertics, hemorrhoids (05/2001) EGD- GERD, HH, stricture (05/2001) Shoulder surgery CT- head- neg (08/2003) Cutaneous Ciliated cyst (10/2003) MRI   brain-  microvascular changes  (10/2004) Dexa- mixed (10/2005) LP for ? NPH in past- neurology MRI lumbar 5/09- severe spinal stenosis L3-4 and L5-6, with synovial cyst at L3-4 dexa 1/10- osteopenia stable in hip, slt imp at spine  Family History: Last updated: 07/15/2006 Father: deceased- kidney failure, HTN Mother: DM, MI Siblings: 3 sisters  Social History: Last updated: 09/17/2007 Marital Status: Married Children: none Occupation: retired non smoker  no alcohol   Risk Factors: Smoking Status: never (Jul 15, 2006)  Review of Systems General:  Denies chills, fatigue, and fever. Eyes:  Denies blurring and discharge. CV:  Complains of chest pain or discomfort; denies difficulty breathing while lying down, near fainting, palpitations, shortness of breath with exertion, and swelling of feet. Resp:  Denies cough, shortness of breath, and wheezing. GI:  Denies abdominal pain, bloody stools, change in bowel habits, indigestion, loss of appetite, nausea, and vomiting. GU:  Denies dysuria and urinary frequency. MS:  Complains of stiffness; denies joint redness and joint swelling. Derm:  Denies itching, lesion(s), poor wound healing, and rash. Neuro:  Denies numbness, tingling, visual disturbances, and weakness. Psych:  Complains of anxiety. Endo:  Denies cold intolerance, excessive thirst, excessive urination, and heat intolerance. Heme:  Denies abnormal bruising, bleeding, and enlarge lymph nodes.  Physical Exam  General:  somewhat frail appearing elderly female  Head:  normocephalic, atraumatic, and no abnormalities observed.  Eyes:  vision grossly intact, pupils equal, pupils round, pupils reactive to light, and no injection.   Ears:  R ear normal and L ear normal.   Mouth:  pharynx pink and moist.   Neck:  supple with full rom and no masses or thyromegally, no JVD or carotid bruit  Chest Wall:  tender ant and lat R chest wall without crepitice or skin change  Breasts:  R  breast- surgical changes noted with scar tissue under scars and in axilla  changes also from radiatio breast and axilla are mildly and diffusely tender no focal mass noted no skin change or nipple d/c no acute changes in L breast  Lungs:  Normal respiratory effort, chest expands symmetrically. Lungs are clear to auscultation, no crackles or wheezes. Heart:  Normal rate and regular rhythm. S1 and S2 normal without gallop, murmur, click, rub or other extra sounds. Abdomen:  Bowel sounds positive,abdomen soft and non-tender without masses, organomegaly or hernias noted. Msk:  No deformity or scoliosis noted of thoracic or lumbar spine.  poor rom knees and slow gait Extremities:  no CCE Neurologic:  strength normal in all extremities, sensation intact to light touch, gait normal, and DTRs symmetrical and normal.   baseline moves slowly with poor balance - no change in that  Skin:  Intact without suspicious lesions or rashes Cervical Nodes:  No lymphadenopathy noted Psych:  normal affect, talkative and pleasant   Diabetes Management Exam:    Foot Exam (with socks and/or shoes not present):       Sensory-Pinprick/Light touch:          Left medial foot (L-4): normal          Left dorsal foot (L-5): normal          Left lateral foot (S-1): normal          Right medial foot (L-4): normal          Right dorsal foot (L-5): normal          Right lateral foot (S-1): normal       Sensory-Monofilament:          Left foot: normal          Right foot: normal       Inspection:          Left foot: normal          Right foot: normal       Nails:          Left foot: normal          Right foot: normal   Impression & Recommendations:  Problem # 1:  BREAST PAIN, RIGHT (ICD-611.71) Assessment New breast and chest wall pain on R side with hx of ca/ surg and radiation to that side  unfortunately  - pt has not had regular mammograms since 07 will ref for dx mam as well as chest x ray andrib films    adv warm compress and tylenol as needed for pain if needed adv to update if worse or any rash develop- will watch closely for that  will update with results and plan   Orders: Radiology Referral (Radiology) Radiology Referral (Radiology)  Problem # 2:  CHEST WALL PAIN, ACUTE (UJW-119.14) Assessment: New see above  Her updated medication list for this problem includes:    Adult Aspirin Low Strength 81 Mg Tbdp (Aspirin) .Marland Kitchen... Take one by mouth daily  Orders: Radiology Referral (Radiology)  Problem # 3:  BREAST CANCER,  HX OF (ICD-V10.3) Assessment: Unchanged pt has had hx of breast ca with full recovery she thinks oncologist signed off on it - but not sure she was not f/u with her annual mammograms as inst  I reminded her of imp of this and now she is agreeable  Orders: Radiology Referral (Radiology) Radiology Referral (Radiology)  Complete Medication List: 1)  Adult Aspirin Low Strength 81 Mg Tbdp (Aspirin) .... Take one by mouth daily 2)  Hydrochlorothiazide 50 Mg Tabs (Hydrochlorothiazide) .... Take one by mouth daily 3)  Protonix 40 Mg Tbec (Pantoprazole sodium) .... Take one by mouth daily 4)  Diovan 80 Mg Tabs (Valsartan) .... Take 11/2 pills each am 5)  Biotin 1000 Mcg Tabs (Biotin) .... Take by mouth as directed 6)  Allegra 60 Mg Tabs (Fexofenadine hcl) .... Take one by mouth as directed as needed 7)  Nasonex 50 Mcg/act Susp (Mometasone furoate) .... Use as directed 8)  Meclizine Hcl 25 Mg Tabs (Meclizine hcl) .... Take 1/2 tabs by mouth two times a day as needed dizziness 9)  Detrol 2 Mg Tabs (Tolterodine tartrate) .Marland Kitchen.. 1 by mouth one time a day 10)  Travatan Z 0.004 % Soln (Travoprost) .... As directed 11)  Zocor 20 Mg Tabs (Simvastatin) .Marland Kitchen.. 1 by mouth once daily in evening with food  Patient Instructions: 1)  we will do referral for x rays and mammogram at check out today  2)  use warm compress on painful area 3)  tylenol is ok to use 4)  I will update you when  I get results   Current Allergies (reviewed today): ! SEPTRA DS

## 2010-02-18 NOTE — Letter (Signed)
Summary: [facility]  [facility]   Imported By: Maryln Gottron 12/03/2009 15:32:56  _____________________________________________________________________  External Attachment:    Type:   Image     Comment:   External Document  Appended Document: [facility]     Clinical Lists Changes  Observations: Added new observation of PAST SURG HX: Carpal tunnel release Cataract extraction Hysterectomy / BSO- stage 1-B adenocarcinoma of endometrium (03/1997) Breast biopsy- high grade inroductal carcinoma Breast surgery x 2- radiation [place]- fatty liver (08/2000) Dexa- osteopenia hip (12/2000)  no change (06/2003) Colonoscopy- divertics, hemorrhoids (05/2001) EGD- GERD, HH, stricture (05/2001) Shoulder surgery CT- head- neg (08/2003) Cutaneous Ciliated cyst (10/2003) MRI  brain-  microvascular changes  (10/2004) Dexa- mixed (10/2005) LP for ? NPH in past- neurology MRI lumbar 5/09- severe spinal stenosis L3-4 and L5-6, with synovial cyst at L3-4 dexa 1/10- osteopenia stable in hip, slt imp at spine 11/11 recurrent breast cancer  (12/06/2009 18:58) Added new observation of PAST MED HX: Breast cancer, hx of Diabetes mellitus, type II Diverticulosis, colon Hyperlipidemia Hypertension Osteoarthritis- knees GERD / hiatal hernia Allergic rhinitis / vasomoter lumbar spinal stenosis 5/09 chronic dizziness with large neuro w/u and LP for presumed NPH anemia  11/11 recurrent breast cancer  sdm to skilled nursing facility 8/09- for PT and OT after back sx   orthopedics--Dr Beane  (12/06/2009 18:58)       Past Medical History:    Breast cancer, hx of    Diabetes mellitus, type II    Diverticulosis, colon    Hyperlipidemia    Hypertension    Osteoarthritis- knees    GERD / hiatal hernia    Allergic rhinitis / vasomoter    lumbar spinal stenosis 5/09    chronic dizziness with large neuro w/u and LP for presumed NPH    anemia   11/11 recurrent breast cancer        sdm to skilled nursing facility 8/09- for PT and OT after back sx            orthopedics--Dr Shelle Iron  Past Surgical History:    Carpal tunnel release    Cataract extraction    Hysterectomy / BSO- stage 1-B adenocarcinoma of endometrium (03/1997)    Breast biopsy- high grade inroductal carcinoma    Breast surgery x 2- radiation    [place]- fatty liver (08/2000)    Dexa- osteopenia hip (12/2000)  no change (06/2003)    Colonoscopy- divertics, hemorrhoids (05/2001)    EGD- GERD, HH, stricture (05/2001)    Shoulder surgery    CT- head- neg (08/2003)    Cutaneous Ciliated cyst (10/2003)    MRI  brain-  microvascular changes  (10/2004)    Dexa- mixed (10/2005)    LP for ? NPH in past- neurology    MRI lumbar 5/09- severe spinal stenosis L3-4 and L5-6, with synovial cyst at L3-4    dexa 1/10- osteopenia stable in hip, slt imp at spine    11/11 recurrent breast cancer

## 2010-02-18 NOTE — Assessment & Plan Note (Signed)
Summary: 30 MIN APPT DMV FORM/RI   Vital Signs:  Patient profile:   77 year old female Height:      58.25 inches Weight:      157.75 pounds BMI:     32.81 Temp:     98 degrees F oral Pulse rate:   72 / minute Pulse rhythm:   regular BP sitting:   178 / 82  (left arm) Cuff size:   regular  Vitals Entered By: Lewanda Rife LPN (May 27, 2009 11:34 AM)  Serial Vital Signs/Assessments:  Time      Position  BP       Pulse  Resp  Temp     By                     145/80                         Judith Part MD  CC: DMV form   History of Present Illness: here for f/u of chronic problems to fill out dmv form   has hx of chronic dizziness had f/u with neurological practice in Jan - no changes in memory and no new advice for dizziness no change in this - has to get up slowly and that helps a lot   hx of stable MCI - no changes   bp high on first check today- pt nervous  feels like her driving is just fine now - no problems at all  has never been in any accidents due to dizziness  does not get dizzy in the car as a rule     Allergies: 1)  ! 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 anemia   sdm to skilled nursing facility 8/09- for PT and OT after back sx   orthopedics--Dr Shelle Iron  Past Surgical History: Last updated: 02/12/2008 Carpal tunnel release Cataract extraction Hysterectomy / BSO- stage 1-B adenocarcinoma of endometrium (03/1997) Breast biopsy- high grade inroductal carcinoma Breast surgery x 2- radiation [place]- fatty liver (08/2000) Dexa- osteopenia hip (12/2000)  no change (06/2003) Colonoscopy- divertics, hemorrhoids (05/2001) EGD- GERD, HH, stricture  (05/2001) Shoulder surgery CT- head- neg (08/2003) Cutaneous Ciliated cyst (10/2003) MRI  brain-  microvascular changes  (10/2004) Dexa- mixed (10/2005) LP for ? NPH in past- neurology MRI lumbar 5/09- severe spinal stenosis L3-4 and L5-6, with synovial cyst at L3-4 dexa 1/10- osteopenia stable in hip, slt imp at spine  Family History: Last updated: 07/14/2006 Father: deceased- kidney failure, HTN Mother: DM, MI Siblings: 3 sisters  Social History: Last updated: 09/17/2007 Marital Status: Married Children: none Occupation: retired non smoker  no alcohol   Risk Factors: Smoking Status: never (2006/07/14)  Review of Systems General:  Denies fatigue, loss of appetite, and malaise. Eyes:  Denies blurring and eye irritation. ENT:  Denies earache, nasal congestion, and postnasal drainage. CV:  Denies chest pain or discomfort. Resp:  Denies cough and wheezing. GI:  Complains of bloody stools; denies change in bowel habits, nausea, and vomiting. GU:  Complains of incontinence; denies dysuria. MS:  Complains of joint pain and stiffness. Derm:  Denies itching, lesion(s), poor wound healing, and rash. Neuro:  Denies numbness, tingling, visual disturbances, and weakness. Psych:  Denies anxiety and depression. Endo:  Denies excessive hunger, excessive thirst, excessive urination, and heat intolerance. Heme:  Denies abnormal bruising and bleeding.  Physical Exam  General:  somewhat frail appearing elderly female  Head:  normocephalic, atraumatic, and no abnormalities observed.   Eyes:  no nystagmus today  Mouth:  pharynx pink and moist.   Neck:  supple with full rom and no masses or thyromegally, no JVD or carotid bruit  Lungs:  Normal respiratory effort, chest expands symmetrically. Lungs are clear to auscultation, no crackles or wheezes. Heart:  Normal rate and regular rhythm. S1 and S2 normal without gallop, murmur, click, rub or other extra sounds. Msk:  No deformity or  scoliosis noted of thoracic or lumbar spine. some OA- no new joint changes  Extremities:  no CCE Neurologic:  alert & oriented X3, cranial nerves II-XII intact, strength normal in all extremities, sensation intact to light touch, gait normal, DTRs symmetrical and normal, and Romberg negative.  - gait is slow and steady Skin:  Intact without suspicious lesions or rashes Cervical Nodes:  No lymphadenopathy noted Psych:  normal affect, talkative and pleasant    Impression & Recommendations:  Problem # 1:  OTH GENERAL MEDICAL EXAMINATION ADMIN PURPOSES (ICD-V70.3) Assessment New exam done for DMV forms no restrictions for driving at this time her dizziness is stable with no seizure hx and does not impair driving memory is stable  forms filled out to scan and mail  Problem # 2:  MEMORY LOSS (ICD-780.93) Assessment: Unchanged this is mild and stable -- MCI dx by neurol over a year ago  pt was mentally sharp today - and will send for jan neurol note to review also   Problem # 3:  DIZZINESS (ICD-780.4) Assessment: Improved  this is stable and controllable with lifestyle and occ meclizine disc this as it pertains to driving pt does not ever drive if dizzy or sleepy and her judgement is good no restrictions  called for last neurol note to update refil meclizine as needed for labyrinthitis  Her updated medication list for this problem includes:    Allegra 60 Mg Tabs (Fexofenadine hcl) .Marland Kitchen... Take one by mouth as directed as needed    Meclizine Hcl 25 Mg Tabs (Meclizine hcl) .Marland Kitchen... Take 1/2 tabs by mouth two times a day as needed dizziness  Orders: Prescription Created Electronically [phone])  Complete Medication List: 1)  Adult Aspirin Low Strength 81 Mg Tbdp (Aspirin) .... Take one by mouth daily 2)  Hydrochlorothiazide 50 Mg Tabs (Hydrochlorothiazide) .... Take one by mouth daily 3)  Protonix 40 Mg Tbec (Pantoprazole sodium) .... Take one by mouth daily 4)  Diovan 80 Mg Tabs  (Valsartan) .... Take 11/2 pills each am 5)  Biotin 1000 Mcg Tabs (Biotin) .... Take by mouth as directed 6)  Allegra 60 Mg Tabs (Fexofenadine hcl) .... Take one by mouth as directed as needed 7)  Nasonex 50 Mcg/act Susp (Mometasone furoate) .... Use as directed 8)  Meclizine Hcl 25 Mg Tabs (Meclizine hcl) .... Take 1/2 tabs by mouth two times a day as needed dizziness 9)  Detrol 2 Mg Tabs (Tolterodine tartrate) .Marland Kitchen.. 1 by mouth one time a day 10)  Travatan Z 0.004 % Soln (Travoprost) .... As directed 11)  Zocor 20 Mg Tabs (Simvastatin) .Marland Kitchen.. 1 by mouth once daily in evening with food 12)  Mobic 15 Mg Tabs (Meloxicam) .... Take one tablet by mouth once daily with a meal as needed for pain  Patient Instructions: 1)  please send for last neurol note- Tina [place] I think in jan 2011  2)  use meclizine when needed for dizziness  3)  let me know when you want an orthopedic referral for your knee pain - just call and let me know  4)  I think you should get out more - be as active as you can  Prescriptions: MECLIZINE HCL 25 MG TABS (MECLIZINE HCL) take 1/2 tabs by mouth two times a day as needed dizziness  #60 x 3   Entered and Authorized by:   Judith Part MD   Signed by:   Judith Part MD on 05/27/2009   Method used:   Electronically to        [facility]* (retail)       [address]       [place], [place]  [address]       Ph: [phone]       Fax: [phone]   RxID:   [id number]   Current Allergies (reviewed today): ! SEPTRA DS

## 2010-02-18 NOTE — Progress Notes (Signed)
Summary: prior Berkley Harvey is needed for detrol  Phone Note From Pharmacy   Caller: [place] Summary of Call: Prior Berkley Harvey is needed for detrol.  Insurance prefers oxybutynin, enablex, vesicare or oxytrol.  Do you want to change or do you want me to call for a prior auth form? Initial call taken by: Lowella Petties CMA,  June 19, 2009 12:02 PM  Follow-up for Phone Call        tell pt situation and ask if she cares if we switch agents (unless she wants to pay out of pocket) Follow-up by: Judith Part MD,  June 19, 2009 5:03 PM  Additional Follow-up for Phone Call Additional follow up Details #1::        Patient notified as instructed by telephone. Pt will try different med.Lewanda Rife LPN  June 19, 1608 5:20 PM  will change to vesicare - px written on EMR for call in   Additional Follow-up by: Judith Part MD,  June 19, 2009 5:21 PM    Additional Follow-up for Phone Call Additional follow up Details #2::    Medication phoned to [facility]  pharmacy as instructed. Patient notified as instructed by telephone. Lewanda Rife LPN  June 20, 9602 5:24 PM   New/Updated Medications: VESICARE 5 MG TABS (SOLIFENACIN SUCCINATE) 1 by mouth once daily Prescriptions: VESICARE 5 MG TABS (SOLIFENACIN SUCCINATE) 1 by mouth once daily  #30 x 11   Entered and Authorized by:   Judith Part MD   Signed by:   Judith Part MD on 06/19/2009   Method used:   Telephoned to ...       [facility] (retail)       [address]       [place], [place]  [address]       Ph: [phone]       Fax: [phone]   RxID:   [id number]

## 2010-02-18 NOTE — Letter (Signed)
Summary: [facility]   Imported By: Lanelle Bal 11/09/2009 15:47:28  _____________________________________________________________________  External Attachment:    Type:   Image     Comment:   External Document

## 2010-02-18 NOTE — Assessment & Plan Note (Signed)
Summary: 1 month follow up/rbh   Vital Signs:  Patient profile:   77 year old female Weight:      154 pounds Temp:     98 degrees F oral Pulse rate:   88 / minute Pulse rhythm:   regular BP sitting:   130 / 65  (left arm) Cuff size:   regular  Vitals Entered By: Lowella Petties CMA (February 09, 2009 11:42 AM)  Serial Vital Signs/Assessments:  Time      Position  BP       Pulse  Resp  Temp     By                     130/65                         Judith Part MD  CC: One month follow up,  pt is having a  problem with dizziness.   History of Present Illness: here for f/u of chronic med problems - HTN and DM and lipids   bp 142/50 first check today-- better on second check after sitting for 5 min  bp has been good -- usually has to check it twice  most of systolics are under 130 (one was 140-- when she was upset)- with diastolic in 60s usually   DM stable with AIC 6.6-- no diff from past check  sugars are running well in general - with occasional exception  as average - low 100s in the am  had her opthy exam -- within last 6 mo -- Dr Lissa Morales -- all was stable without problems   lipids are stable -- LDL is 125- still not at goal  HB 11.9-- down a bit -- told to take mvi with iron ? if that helps her energy- poss does not get enough iron in her diet  cereal for bkfast- fortified / lunch is sandwhich and fruit / dinner leftovers -- as a rule no red meat /= mostly chicken (rarely eats hamburger)   thinks her dizziness overall is worse-- meclizine does help a little /not a lot  last visit neurology was in 09  feels fine except being dizzy     Allergies: 1)  ! 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 anemia   sdm to skilled nursing facility 8/09- for PT and OT after back sx   orthopedics--Dr Shelle Iron  Past Surgical History: Last updated: 02/12/2008 Carpal tunnel release Cataract extraction Hysterectomy / BSO- stage 1-B adenocarcinoma of endometrium (03/1997) Breast biopsy- high grade inroductal carcinoma Breast surgery x 2- radiation [place]- fatty liver (08/2000) Dexa- osteopenia hip (12/2000)  no change (06/2003) Colonoscopy- divertics, hemorrhoids (05/2001) EGD- GERD, HH, stricture (05/2001) Shoulder surgery CT- head- neg (08/2003) Cutaneous Ciliated cyst (10/2003) MRI  brain-  microvascular changes  (10/2004) Dexa- mixed (10/2005) LP for ? NPH in past- neurology MRI lumbar 5/09- severe spinal stenosis L3-4 and L5-6, with synovial cyst at L3-4 dexa 1/10- osteopenia stable in hip, slt imp at spine  Family History: Last updated: 07/13/06 Father: deceased- kidney failure, HTN Mother: DM, MI Siblings: 3 sisters  Social History: Last updated: 09/17/2007 Marital Status: Married Children: none Occupation: retired non smoker  no alcohol  Risk Factors: Smoking Status: never (07/11/2006)  Review of Systems General:  Complains of fatigue; denies loss of appetite and malaise. Eyes:  Denies blurring and eye irritation. CV:  Denies chest pain or discomfort, palpitations, and shortness of breath with exertion. Resp:  Denies cough and wheezing. GI:  Denies abdominal pain and change in bowel habits. MS:  Denies muscle aches. Derm:  Denies itching, lesion(s), poor wound healing, and rash. Neuro:  Complains of poor balance and sensation of room spinning; denies numbness, tingling, tremors, visual disturbances, and weakness. Psych:  mood is ok - but anxious  that she may fall with dizziness. Endo:  Denies cold intolerance, excessive thirst, excessive urination, and heat intolerance. Heme:  Denies abnormal bruising and bleeding.  Physical  Exam  General:  somewhat frail appearing elderly female  Head:  normocephalic, atraumatic, and no abnormalities observed.  no sinus or temporal tenderness  Eyes:  vision grossly intact, pupils equal, pupils round, and pupils reactive to light.  no nystagmus no conjunctival pallor, injection or icterus  Ears:  R ear normal and L ear normal.  - scant cerumen Nose:  no nasal discharge.   Mouth:  pharynx pink and moist.   Neck:  supple with full rom and no masses or thyromegally, no JVD or carotid bruit  Chest Wall:  No deformities, masses, or tenderness noted. Lungs:  Normal respiratory effort, chest expands symmetrically. Lungs are clear to auscultation, no crackles or wheezes. Heart:  Normal rate and regular rhythm. S1 and S2 normal without gallop, murmur, click, rub or other extra sounds. Abdomen:  soft, non-tender, and normal bowel sounds.  no renal bruits  Msk:  No deformity or scoliosis noted of thoracic or lumbar spine.  poor rom knees and slow gait Pulses:  R and L carotid,radial,femoral,dorsalis pedis and posterior tibial pulses are full and equal bilaterally Extremities:  no CCE Neurologic:  cranial nerves II-XII intact, sensation intact to light touch, gait normal, DTRs symmetrical and normal, and finger-to-nose normal.   Skin:  Intact without suspicious lesions or rashes Cervical Nodes:  No lymphadenopathy noted Psych:  mildly anxious but pleasant and talkative  Diabetes Management Exam:    Foot Exam (with socks and/or shoes not present):       Sensory-Pinprick/Light touch:          Left medial foot (L-4): normal          Left dorsal foot (L-5): normal          Left lateral foot (S-1): normal          Right medial foot (L-4): normal          Right dorsal foot (L-5): normal          Right lateral foot (S-1): normal       Sensory-Monofilament:          Left foot: normal          Right foot: normal       Inspection:          Left foot: normal          Right foot: normal        Nails:          Left foot: normal          Right foot: normal    Eye Exam:       Eye Exam done elsewhere          Date: 07/17/2008  Results: normal          Done by: Dr Lissa Morales   Impression & Recommendations:  Problem # 1:  ANEMIA (ICD-285.9) Assessment Unchanged  very slt -- will continue to follow (has been since her last surgery) enc mvi with iron / diet is low iron   Orders: Prescription Created Electronically [phone])  Problem # 2:  DIZZINESS (ICD-780.4) Assessment: Deteriorated ongoing- helped some by meclizine but worsening will schedule neurology f/u  Her updated medication list for this problem includes:    Allegra 60 Mg Tabs (Fexofenadine hcl) .Marland Kitchen... Take one by mouth as directed as needed    Meclizine Hcl 25 Mg Tabs (Meclizine hcl) .Marland Kitchen... Take 1/2 tabs by mouth two times a day as needed dizziness  Orders: Neurology Referral (Neuro) Prescription Created Electronically [id number])  Problem # 3:  HYPERTENSION (ICD-401.9) Assessment: Unchanged  bp is stable on current meds disc sodium avoidance  enc to keep watching at home Her updated medication list for this problem includes:    Hydrochlorothiazide 50 Mg Tabs (Hydrochlorothiazide) .Marland Kitchen... Take one by mouth daily    Diovan 80 Mg Tabs (Valsartan) .Marland Kitchen... Take 11/2 pills each am  Orders: Prescription Created Electronically [phone]) Prescription Created Electronically [phone])  BP today: 130/65 Prior BP: 160/78 (01/08/2009)  Labs Reviewed: K+: 3.7 (01/08/2009) Creat: : 0.8 (01/08/2009)   Chol: 192 (01/08/2009)   HDL: 39.10 (01/08/2009)   LDL: 125 (01/08/2009)   TG: 141.0 (01/08/2009)  Problem # 4:  HYPERLIPIDEMIA (ICD-272.4) Assessment: Unchanged  LDL not at goal -- will try simvastatin disc poss side eff- update  rev low sat fat diet lab 6 wk f/u 6 mo  Her updated medication list for this problem includes:    Zocor 20 Mg Tabs (Simvastatin) .Marland Kitchen... 1 by mouth once daily in evening with  food  Orders: Prescription Created Electronically [phone]) Prescription Created Electronically [phone])  Labs Reviewed: SGOT: 28 (01/08/2009)   SGPT: 25 (01/08/2009)   HDL:39.10 (01/08/2009), 32.8 (06/18/2007)  LDL:125 (01/08/2009), 131 (06/18/2007)  Chol:192 (01/08/2009), 194 (06/18/2007)  Trig:141.0 (01/08/2009), 150 (06/18/2007)  Problem # 5:  DIABETES MELLITUS, TYPE II (ICD-250.00) in good control with diet alone  disc healthy diet (low simple sugar/ choose complex carbs/ low sat fat) diet and exercise in detail  opthy utd  f/u in 6 mo  Her updated medication list for this problem includes:    Adult Aspirin Low Strength 81 Mg Tbdp (Aspirin) .Marland Kitchen... Take one by mouth daily    Diovan 80 Mg Tabs (Valsartan) .Marland Kitchen... Take 11/2 pills each am  Orders: Prescription Created Electronically [phone]) Prescription Created Electronically [phone])  Complete Medication List: 1)  Adult Aspirin Low Strength 81 Mg Tbdp (Aspirin) .... Take one by mouth daily 2)  Hydrochlorothiazide 50 Mg Tabs (Hydrochlorothiazide) .... Take one by mouth daily 3)  Protonix 40 Mg Tbec (Pantoprazole sodium) .... Take one by mouth daily 4)  Diovan 80 Mg Tabs (Valsartan) .... Take 11/2 pills each am 5)  Biotin 1000 Mcg Tabs (Biotin) .... Take by mouth as directed 6)  Allegra 60 Mg Tabs (Fexofenadine hcl) .... Take one by mouth as directed as needed 7)  Nasonex 50 Mcg/act Susp (Mometasone furoate) .... Use as directed 8)  Meclizine Hcl 25 Mg Tabs (Meclizine hcl) .... Take 1/2 tabs by mouth two times a day as needed dizziness 9)  Detrol 2 Mg Tabs (Tolterodine tartrate) .Marland Kitchen.. 1 by mouth two times a day 10)  Travatan Z 0.004 % Soln (Travoprost) .... As directed 11)  Zocor 20  Mg Tabs (Simvastatin) .Marland Kitchen.. 1 by mouth once daily in evening with food  Patient Instructions: 1)  I sent your px to [facility] pharmacy 2)  diabetes is stable  3)  for cholesterol - start on simvastatin 20 mg one pill each evening with food  4)  if any side  effects -- like muscle aches and pains- please update me  5)  keep working on healthy diet and exercise  6)  schedule fasting labs in 6 weeks lipid/ast/alt 272  7)  follow up with me in 6 months 8)  we will refer you to neurology for dizziness at check out  Prescriptions: ZOCOR 20 MG TABS (SIMVASTATIN) 1 by mouth once daily in evening with food  #30 x 5   Entered and Authorized by:   Judith Part MD   Signed by:   Judith Part MD on 02/09/2009   Method used:   Electronically to        [facility]* (retail)       [address]       [place], [place]  [address]       Ph: [phone]       Fax: [phone]   RxID:   [id number] DETROL 2 MG TABS (TOLTERODINE TARTRATE) 1 by mouth two times a day  #60 x 11   Entered and Authorized by:   Judith Part MD   Signed by:   Judith Part MD on 02/09/2009   Method used:   Electronically to        [facility]* (retail)       [address]       [place], [place]  [address]       Ph: [phone]       Fax: [phone]   RxID:   [id number] MECLIZINE HCL 25 MG TABS (MECLIZINE HCL) take 1/2 tabs by mouth two times a day as needed dizziness  #60 x 1   Entered and Authorized by:   Judith Part MD   Signed by:   Judith Part MD on 02/09/2009   Method used:   Electronically to        [facility]* (retail)       [address]       [place], [place]  [address]       Ph: [phone]       Fax: [phone]   RxID:   [id number] DIOVAN 80 MG  TABS (VALSARTAN) take 11/2 pills each am  #45 x 11   Entered and Authorized by:   Judith Part MD   Signed by:   Judith Part MD on 02/09/2009   Method used:   Electronically to        [facility]* (retail)       [address]       [place], [place]  [address]       Ph: [phone]       Fax: [phone]   RxID:   [id number] PROTONIX 40 MG  TBEC (PANTOPRAZOLE SODIUM) take one by mouth daily  #30 x 11   Entered and Authorized by:   Judith Part MD    Signed by:   Judith Part MD on 02/09/2009   Method used:   Electronically to        [facility]* (retail)       [address]       [place], [place]  [address]       Ph:  [phone]       Fax: [phone]   RxID:   [id number] HYDROCHLOROTHIAZIDE 50 MG  TABS (HYDROCHLOROTHIAZIDE) take one by mouth daily  #30 x 11   Entered and Authorized by:   Judith Part MD   Signed by:   Judith Part MD on 02/09/2009   Method used:   Electronically to        [facility]* (retail)       [address]       [place], [place]  [address]       Ph: [phone]       Fax: [phone]   RxID:   [id number]   Prior Medications (reviewed today): ADULT ASPIRIN LOW STRENGTH 81 MG  TBDP (ASPIRIN) take one by mouth daily HYDROCHLOROTHIAZIDE 50 MG  TABS (HYDROCHLOROTHIAZIDE) take one by mouth daily PROTONIX 40 MG  TBEC (PANTOPRAZOLE SODIUM) take one by mouth daily DIOVAN 80 MG  TABS (VALSARTAN) take 11/2 pills each am BIOTIN 1000 MCG  TABS (BIOTIN) take by mouth as directed ALLEGRA 60 MG  TABS (FEXOFENADINE HCL) take one by mouth as directed as needed NASONEX 50 MCG/ACT  SUSP (MOMETASONE FUROATE) use as directed MECLIZINE HCL 25 MG TABS (MECLIZINE HCL) take 1/2 tabs by mouth two times a day as needed dizziness DETROL 2 MG TABS (TOLTERODINE TARTRATE) 1 by mouth two times a day TRAVATAN Z 0.004 % SOLN (TRAVOPROST) as directed ZOCOR 20 MG TABS (SIMVASTATIN) 1 by mouth once daily in evening with food Current Allergies: ! SEPTRA DS

## 2010-02-18 NOTE — Consult Note (Signed)
Summary: [facility]   Imported By: Lanelle Bal 05/26/2009 11:23:30  _____________________________________________________________________  External Attachment:    Type:   Image     Comment:   External Document

## 2010-02-18 NOTE — Letter (Signed)
Summary: [facility]  [facility]   Imported By: Lanelle Bal 06/04/2009 11:42:20  _____________________________________________________________________  External Attachment:    Type:   Image     Comment:   External Document

## 2010-02-18 NOTE — Letter (Signed)
Summary: [facility] No Show Letter  [place] [facility]  [address] [place], [place] [address]   Phone: [phone]  Fax: [phone]    11/11/2009 MRN: [id number]  Courtney Castillo [address] [place], [place]  [address]   Dear [name],   Our records indicate that you missed your scheduled appointment with ____Lab_________________ on __10-25-11__________.  Please contact this office to reschedule your appointment as soon as possible.  It is important that you keep your scheduled appointments with your physician, so we can provide you the best care possible.  Please be advised that there may be a charge for "no show" appointments.    Sincerely,    at [facility]

## 2010-02-18 NOTE — Progress Notes (Signed)
Summary: forms for [facility]  Phone Note Call from Patient Call back at Home Phone [phone]   Caller: Patient Call For: Judith Part MD Summary of Call: Pt has gotten forms from [facility] that need to be filled out in order for her to get her drivers license.  This is about 10 pages long.  Will she need an office visit for this, or can she drop off the forms to be completed?  They are requiring this because the pt told them she is dizzy at times. Initial call taken by: Lowella Petties CMA,  May 13, 2009 2:03 PM  Follow-up for Phone Call        she needs to f/u for this please -- those forms are very complex Follow-up by: Judith Part MD,  May 13, 2009 2:06 PM  Additional Follow-up for Phone Call Additional follow up Details #1::        Patient notified as instructed by telephone. Pt scheduled 30 mn appt 05/27/09 at 11:30 am.Rena Isley LPN  May 13, 2009 2:59 PM

## 2010-02-18 NOTE — Progress Notes (Signed)
Summary: form for diabetic supplies  Phone Note From Pharmacy   Caller: [facility] Summary of Call: Form for diabetic supplies is on your desk.  Pt states she does want these supplies. Initial call taken by: Lowella Petties CMA,  September 30, 2009 11:57 AM  Follow-up for Phone Call        form done and in nurse in box  Follow-up by: Judith Part MD,  September 30, 2009 12:50 PM  Additional Follow-up for Phone Call Additional follow up Details #1::        .completed form faxed to [phone].Lewanda Rife LPN  September 30, 2009 2:36 PM

## 2010-02-18 NOTE — Assessment & Plan Note (Signed)
Summary: 6 MONTH FOLLOW UP/[facility]   Vital Signs:  Patient profile:   77 year old female Height:      58.25 inches Weight:      155.75 pounds BMI:     32.39 Temp:     98.4 degrees F oral Pulse rate:   92 / minute Pulse rhythm:   regular BP sitting:   146 / 60  (left arm) Cuff size:   regular  Vitals Entered By: Lewanda Rife LPN (August 10, 2009 12:11 PM)  Serial Vital Signs/Assessments:  Time      Position  BP       Pulse  Resp  Temp     By                     130/78                         Judith Part MD  CC: six month f/u after labs Pt is still dizzy   History of Present Illness: here for f/u of HTN and DM and lipids   DM is stable with AIC 6.7 today is doing fair with  diabetic diet -- " not real smart " with it  eats some sweets    bp 146/60 on first check today second check 130/78  lipids up a bit with LDL 137 on the zocor  diet is about the same - still eats too much eggs/ breakfast meats/ fried foods    is still dizzy- went to PT for that  using a cane  has appt with neurol in wed  she previously declined further testing      Allergies: 1)  ! 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 anemia   sdm to skilled nursing facility 8/09- for PT and OT after back sx   orthopedics--Dr Shelle Iron  Past Surgical History: Last updated: 02/12/2008 Carpal tunnel release Cataract extraction Hysterectomy / BSO- stage 1-B adenocarcinoma of endometrium (03/1997) Breast biopsy- high grade inroductal carcinoma Breast surgery x 2- radiation [place]- fatty liver (08/2000) Dexa- osteopenia hip (12/2000)  no change (06/2003) Colonoscopy- divertics, hemorrhoids (05/2001) EGD-  GERD, HH, stricture (05/2001) Shoulder surgery CT- head- neg (08/2003) Cutaneous Ciliated cyst (10/2003) MRI  brain-  microvascular changes  (10/2004) Dexa- mixed (10/2005) LP for ? NPH in past- neurology MRI lumbar 5/09- severe spinal stenosis L3-4 and L5-6, with synovial cyst at L3-4 dexa 1/10- osteopenia stable in hip, slt imp at spine  Family History: Last updated: 07-31-2006 Father: deceased- kidney failure, HTN Mother: DM, MI Siblings: 3 sisters  Social History: Last updated: 09/17/2007 Marital Status: Married Children: none Occupation: retired non smoker  no alcohol   Risk Factors: Smoking Status: never (07-31-06)  Review of Systems General:  Complains of fatigue; denies chills, fever, loss of appetite, and malaise. Eyes:  Denies blurring, double vision, and eye irritation. ENT:  Denies earache, postnasal drainage, and sinus pressure. CV:  Denies chest pain or discomfort and palpitations. Resp:  Denies cough, shortness of breath, and wheezing. GI:  Denies indigestion, nausea, and vomiting. GU:  Denies dysuria. MS:  Complains of joint pain and stiffness; denies joint redness and joint swelling. Derm:  Denies itching, lesion(s), poor wound  healing, and rash. Neuro:  Complains of sensation of room spinning; denies falling down, headaches, numbness, and tingling. Psych:  mood is ok . Endo:  Denies cold intolerance, excessive thirst, excessive urination, and heat intolerance. Heme:  Denies abnormal bruising and bleeding.  Physical Exam  General:  somewhat frail appearing elderly female  Head:  normocephalic, atraumatic, and no abnormalities observed.  no sinus or temporal tenderness Eyes:  vision grossly intact, pupils equal, pupils round, and pupils reactive to light.  no conjunctival pallor, injection or icterus  Mouth:  pharynx pink and moist.   Neck:  supple with full rom and no masses or thyromegally, no JVD or carotid bruit  Lungs:  Normal respiratory  effort, chest expands symmetrically. Lungs are clear to auscultation, no crackles or wheezes. Heart:  Normal rate and regular rhythm. S1 and S2 normal without gallop, murmur, click, rub or other extra sounds. Abdomen:  Bowel sounds positive,abdomen soft and non-tender without masses, organomegaly or hernias noted. no renal bruits  Msk:  No deformity or scoliosis noted of thoracic or lumbar spine.   Pulses:  R and L carotid,radial,femoral,dorsalis pedis and posterior tibial pulses are full and equal bilaterally Extremities:  no CCE Neurologic:  cranial nerves II-XII intact, sensation intact to light touch, gait normal, and DTRs symmetrical and normal.   Skin:  Intact without suspicious lesions or rashes Cervical Nodes:  No lymphadenopathy noted Inguinal Nodes:  No significant adenopathy Psych:  normal affect, talkative and pleasant   Diabetes Management Exam:    Foot Exam (with socks and/or shoes not present):       Sensory-Pinprick/Light touch:          Left medial foot (L-4): normal          Left dorsal foot (L-5): normal          Left lateral foot (S-1): normal          Right medial foot (L-4): normal          Right dorsal foot (L-5): normal          Right lateral foot (S-1): normal       Sensory-Monofilament:          Left foot: normal          Right foot: normal       Inspection:          Left foot: normal          Right foot: normal       Nails:          Left foot: normal          Right foot: normal   Impression & Recommendations:  Problem # 1:  DIZZINESS (ICD-780.4) Assessment Unchanged with some imp in PT - has not fallen  for f/u with neurol this week Her updated medication list for this problem includes:    Allegra 60 Mg Tabs (Fexofenadine hcl) .Marland Kitchen... Take one by mouth as directed as needed    Meclizine Hcl 25 Mg Tabs (Meclizine hcl) .Marland Kitchen... Take 1/2 tabs by mouth two times a day as needed dizziness  Problem # 2:  HYPERTENSION (ICD-401.9) Assessment: Unchanged  this  is stable - better on 2nd check and at home  no change in med  f/u 3 mo  labs reviewed  Her updated medication list for this problem includes:    Hydrochlorothiazide 50 Mg Tabs (Hydrochlorothiazide) .Marland Kitchen... Take one by mouth daily    Diovan 80 Mg Tabs (Valsartan) .Marland Kitchen... Take  11/2 pills each am  BP today: 146/60- re check 130/78 Prior BP: 178/82 (05/27/2009)  Labs Reviewed: K+: 4.1 (08/06/2009) Creat: : 0.8 (08/06/2009)   Chol: 198 (08/06/2009)   HDL: 35.50 (08/06/2009)   LDL: 137 (08/06/2009)   TG: 128.0 (08/06/2009)  Problem # 3:  HYPERLIPIDEMIA (ICD-272.4) Assessment: Deteriorated  this is up with very poor diet and statin rev low sat fat diet in detail and handout given from [facility] need to elim the red meat and fried foods  re check 3 mo and f/u  consider diff statin and nut consult if not imp Her updated medication list for this problem includes:    Zocor 20 Mg Tabs (Simvastatin) .Marland Kitchen... 1 by mouth once daily in evening with food  Labs Reviewed: SGOT: 24 (08/06/2009)   SGPT: 19 (08/06/2009)   HDL:35.50 (08/06/2009), 43.60 (03/23/2009)  LDL:137 (08/06/2009), 92 (04/54/0981)  Chol:198 (08/06/2009), 168 (03/23/2009)  Trig:128.0 (08/06/2009), 161.0 (03/23/2009)  Problem # 4:  DIABETES MELLITUS, TYPE II (ICD-250.00) Assessment: Unchanged  this is fairly stable and diet controlled disc healthy diet (low simple sugar/ choose complex carbs/ low sat fat) diet and exercise in detail  handout given [facility] on nutrition  consider DM teach in future - pt declines it now  Her updated medication list for this problem includes:    Adult Aspirin Low Strength 81 Mg Tbdp (Aspirin) .Marland Kitchen... Take one by mouth daily    Diovan 80 Mg Tabs (Valsartan) .Marland Kitchen... Take 11/2 pills each am  Labs Reviewed: Creat: 0.8 (08/06/2009)     Last Eye Exam: normal (07/17/2008) Reviewed HgBA1c results: 6.7 (08/06/2009)  6.6 (01/08/2009)  Complete Medication List: 1)  Adult Aspirin Low Strength 81 Mg Tbdp (Aspirin) ....  Take one by mouth daily 2)  Hydrochlorothiazide 50 Mg Tabs (Hydrochlorothiazide) .... Take one by mouth daily 3)  Protonix 40 Mg Tbec (Pantoprazole sodium) .... Take one by mouth daily 4)  Diovan 80 Mg Tabs (Valsartan) .... Take 11/2 pills each am 5)  Biotin 1000 Mcg Tabs (Biotin) .... Take by mouth as directed 6)  Allegra 60 Mg Tabs (Fexofenadine hcl) .... Take one by mouth as directed as needed 7)  Nasonex 50 Mcg/act Susp (Mometasone furoate) .... Use as directed 8)  Meclizine Hcl 25 Mg Tabs (Meclizine hcl) .... Take 1/2 tabs by mouth two times a day as needed dizziness 9)  Travatan Z 0.004 % Soln (Travoprost) .... As directed 10)  Zocor 20 Mg Tabs (Simvastatin) .Marland Kitchen.. 1 by mouth once daily in evening with food 11)  Mobic 15 Mg Tabs (Meloxicam) .... Take one tablet by mouth once daily with a meal as needed for pain 12)  Vesicare 5 Mg Tabs (Solifenacin succinate) .Marland Kitchen.. 1 by mouth once daily  Patient Instructions: 1)  try to stick to diabetic diet - no sweets or sweet drinks and smaller servings of carbs (pasta/ potato/ bread/ rice)  2)  also get as much exercise as you can safely get  3)  follow up with the neurologist for the dizziness as planned  4)  you can raise your HDL (good cholesterol) by increasing exercise and eating omega 3 fatty acid supplement like fish oil or flax seed oil over the counter 5)  you can lower LDL (bad cholesterol) by limiting saturated fats in diet like red meat, fried foods, egg yolks, fatty breakfast meats, high fat dairy products and shellfish  6)  if your labs do not improve - we may consider sending you to some nutritional classes 7)  schedule  fasting labs in 3 months lipid/ast/alt/AIC 272, 250.0 and then follow up   Current Allergies (reviewed today): ! SEPTRA DS

## 2010-02-18 NOTE — Letter (Signed)
Summary: [facility]   Imported By: Sherian Rein 10/09/2009 07:43:34  _____________________________________________________________________  External Attachment:    Type:   Image     Comment:   External Document

## 2010-02-18 NOTE — Progress Notes (Signed)
Summary: refill request for mobic  Phone Note Refill Request Message from:  Fax from Pharmacy  Refills Requested: Medication #1:  MOBIC 15 MG TABS take one tablet by mouth once daily with a meal as needed for pain   Last Refilled: 05/27/2009 Faxed request from [place].  Initial call taken by: Lowella Petties CMA,  August 07, 2009 8:27 AM  Follow-up for Phone Call        px written on EMR for call in  Follow-up by: Judith Part MD,  August 07, 2009 8:45 AM  Additional Follow-up for Phone Call Additional follow up Details #1::        Medication phoned to [facility] pharmacy as instructed. Lewanda Rife LPN  August 07, 2009 10:40 AM     Prescriptions: MOBIC 15 MG TABS (MELOXICAM) take one tablet by mouth once daily with a meal as needed for pain  #30 x 0   Entered by:   Lewanda Rife LPN   Authorized by:   Judith Part MD   Signed by:   Lewanda Rife LPN on 16/10/9602   Method used:   Telephoned to ...       [facility] (retail)       [address]       [place], [place]  [address]       Ph: [phone]       Fax: [phone]   RxID:   [id number]

## 2010-02-18 NOTE — Progress Notes (Signed)
Summary: Declined surgery appt  Phone Note Outgoing Call   Call placed by: Daine Gip,  April 21, 2009 4:46 PM Summary of Call: Surgery Declined at this time... Pt says she wants to see the her Breast will get better, she does not want to have surgery at this time. Pt says she will call when ready.Daine Gip  April 21, 2009 4:46 PM   Follow-up for Phone Call        I do not think she will end up needing surgery- just wanted a surgeon to take a look at her and try to figure out what is causing pain/ see if anything can be done  so is up to her - can cancel ref if she wants to  just keep me updated  Follow-up by: Judith Part MD,  April 21, 2009 4:49 PM  Additional Follow-up for Phone Call Additional follow up Details #1::        Patient notified as instructed by telephone. Pt will let Dr Milinda Antis know how she is doing.Lewanda Rife LPN  April 21, 1608 5:04 PM

## 2010-02-18 NOTE — Medication Information (Signed)
Summary: [facility]  [facility]   Imported By: Lester Turney 12/07/2009 13:53:53  _____________________________________________________________________  External Attachment:    Type:   Image     Comment:   External Document

## 2010-02-18 NOTE — Medication Information (Signed)
Summary: Order for Diabetic Testing Supplies  Order for Diabetic Testing Supplies   Imported By: Maryln Gottron 10/19/2009 10:10:00  _____________________________________________________________________  External Attachment:    Type:   Image     Comment:   External Document

## 2010-02-18 NOTE — Progress Notes (Signed)
Summary: [facility] re diabetic supplies  Phone Note From Other Clinic Call back at [phone] ext [phone]   Caller: Aram Beecham with [facility] Call For: [facility] Summary of Call: Aram Beecham called and left v/m  to ck on diabetic supplies. I called  and left v/m for Aram Beecham to call us back. Initial call taken by: Lewanda Rife LPN,  October 16, 2009 2:43 PM  Follow-up for Phone Call        Spoke to Aram Beecham and was informed that she was just calling to advise that she had received the form back from Dr. Milinda Antis and wanted to verify that patient can have supplies for a year.  Verified for a year. Follow-up by: Sydell Axon LPN,  October 19, 2009 11:14 AM

## 2010-02-18 NOTE — Letter (Signed)
Summary: Medical Report Form/[facility]  Medical Report Form/[facility]   Imported By: Lanelle Bal 06/05/2009 14:10:16  _____________________________________________________________________  External Attachment:    Type:   Image     Comment:   External Document

## 2010-02-18 NOTE — Progress Notes (Signed)
Summary: form for diabetic supplies  Phone Note From Pharmacy   Caller: [facility] [facility] Summary of Call: Form for diabetic supplies is on your shelf.  You just signed one a couple of weeks ago, but this one has a few more questions. Initial call taken by: Lowella Petties CMA,  October 12, 2009 8:17 AM  Follow-up for Phone Call        form done and in nurse in box  Follow-up by: Judith Part MD,  October 12, 2009 11:55 AM  Additional Follow-up for Phone Call Additional follow up Details #1::        completed form is faxed to [phone] as instructed. form sent to be scanned.Lewanda Rife LPN  October 12, 2009 2:12 PM

## 2010-02-19 NOTE — Letter (Signed)
[facility]   Imported By: Maryln Gottron 09/01/2009 13:00:37  _____________________________________________________________________  External Attachment:    Type:   Image     Comment:   External Document

## 2010-03-16 NOTE — Letter (Signed)
Summary: [facility]  [facility]   Imported By: Maryln Gottron 03/02/2010 13:14:23  _____________________________________________________________________  External Attachment:    Type:   Image     Comment:   External Document

## 2010-03-30 LAB — BASIC METABOLIC PANEL
BUN: 12 mg/dL (ref 6–23)
BUN: 14 mg/dL (ref 6–23)
BUN: 9 mg/dL (ref 6–23)
CO2: 28 mEq/L (ref 19–32)
Calcium: 8.5 mg/dL (ref 8.4–10.5)
Calcium: 9.3 mg/dL (ref 8.4–10.5)
Chloride: 92 mEq/L — ABNORMAL LOW (ref 96–112)
Chloride: 98 mEq/L (ref 96–112)
Creatinine, Ser: 0.92 mg/dL (ref 0.4–1.2)
GFR calc Af Amer: >60 mL/min (ref >60)
GFR calc non Af Amer: >60 mL/min (ref >60)
GFR calc non Af Amer: >60 mL/min (ref >60)
Glucose, Bld: 119 mg/dL — ABNORMAL HIGH (ref 70–99)
Glucose, Bld: 144 mg/dL — ABNORMAL HIGH (ref 70–99)
Potassium: 3.9 mEq/L (ref 3.5–5.1)
Sodium: 133 mEq/L — ABNORMAL LOW (ref 135–145)
Sodium: 134 mEq/L — ABNORMAL LOW (ref 135–145)

## 2010-03-30 LAB — COMPREHENSIVE METABOLIC PANEL
ALT: 19 U/L (ref 0–35)
AST: 24 U/L (ref 0–37)
Albumin: 4.1 g/dL (ref 3.5–5.2)
Alkaline Phosphatase: 72 U/L (ref 39–117)
Calcium: 9.7 mg/dL (ref 8.4–10.5)
GFR calc Af Amer: >60 mL/min (ref >60)
Glucose, Bld: 102 mg/dL — ABNORMAL HIGH (ref 70–99)
Potassium: 3.8 mEq/L (ref 3.5–5.1)
Sodium: 129 mEq/L — ABNORMAL LOW (ref 135–145)
Total Protein: 7.7 g/dL (ref 6.0–8.3)

## 2010-03-30 LAB — GLUCOSE, CAPILLARY
Glucose-Capillary: 105 mg/dL — ABNORMAL HIGH (ref 70–99)
Glucose-Capillary: 117 mg/dL — ABNORMAL HIGH (ref 70–99)
Glucose-Capillary: 126 mg/dL — ABNORMAL HIGH (ref 70–99)
Glucose-Capillary: 137 mg/dL — ABNORMAL HIGH (ref 70–99)
Glucose-Capillary: 146 mg/dL — ABNORMAL HIGH (ref 70–99)
Glucose-Capillary: 167 mg/dL — ABNORMAL HIGH (ref 70–99)

## 2010-03-30 LAB — CBC
HCT: 31.4 % — ABNORMAL LOW (ref 36.0–46.0)
Hemoglobin: 12.8 g/dL (ref 12.0–15.0)
Hemoglobin: 9.4 g/dL — ABNORMAL LOW (ref 12.0–15.0)
MCH: 31.9 pg (ref 26.0–34.0)
MCHC: 35 g/dL (ref 30.0–36.0)
MCHC: 35.3 g/dL (ref 30.0–36.0)
Platelets: 222 10*3/uL (ref 150–400)
RBC: 3.46 MIL/uL — ABNORMAL LOW (ref 3.87–5.11)
RBC: 4.01 MIL/uL (ref 3.87–5.11)
RDW: 13.4 % (ref 11.5–15.5)
RDW: 13.5 % (ref 11.5–15.5)
WBC: 12.1 10*3/uL — ABNORMAL HIGH (ref 4.0–10.5)
WBC: 6.7 10*3/uL (ref 4.0–10.5)

## 2010-03-30 LAB — HEMOGLOBIN A1C: Mean Plasma Glucose: 143 mg/dL — ABNORMAL HIGH (ref <117)

## 2010-03-30 LAB — DIFFERENTIAL
Basophils Relative: 1 % (ref 0–1)
Eosinophils Absolute: 0.1 10*3/uL (ref 0.0–0.7)
Monocytes Relative: 11 % (ref 3–12)
Neutro Abs: 3.6 10*3/uL (ref 1.7–7.7)
Neutrophils Relative %: 55 % (ref 43–77)

## 2010-03-30 LAB — SURGICAL PCR SCREEN
MRSA, PCR: NEGATIVE
Staphylococcus aureus: NEGATIVE

## 2010-04-01 LAB — DIFFERENTIAL
Basophils Relative: 1 % (ref 0–1)
Lymphocytes Relative: 24 % (ref 12–46)
Lymphs Abs: 1.7 10*3/uL (ref 0.7–4.0)
Monocytes Relative: 13 % — ABNORMAL HIGH (ref 3–12)
Neutro Abs: 4.3 10*3/uL (ref 1.7–7.7)
Neutrophils Relative %: 61 % (ref 43–77)

## 2010-04-01 LAB — URINALYSIS, ROUTINE W REFLEX MICROSCOPIC
Ketones, ur: NEGATIVE mg/dL
Leukocytes, UA: NEGATIVE
Nitrite: NEGATIVE
Specific Gravity, Urine: 1.006 (ref 1.005–1.030)
pH: 7 (ref 5.0–8.0)

## 2010-04-01 LAB — GLUCOSE, CAPILLARY: Glucose-Capillary: 104 mg/dL — ABNORMAL HIGH (ref 70–99)

## 2010-04-01 LAB — CBC
HCT: 38.2 % (ref 36.0–46.0)
MCHC: 34.8 g/dL (ref 30.0–36.0)
MCV: 90.7 fL (ref 78.0–100.0)
RDW: 12.6 % (ref 11.5–15.5)

## 2010-04-01 LAB — COMPREHENSIVE METABOLIC PANEL
BUN: 11 mg/dL (ref 6–23)
Calcium: 9.5 mg/dL (ref 8.4–10.5)
Creatinine, Ser: 0.92 mg/dL (ref 0.4–1.2)
Glucose, Bld: 124 mg/dL — ABNORMAL HIGH (ref 70–99)
Total Protein: 7.2 g/dL (ref 6.0–8.3)

## 2010-04-01 LAB — SURGICAL PCR SCREEN: MRSA, PCR: NEGATIVE

## 2010-04-07 ENCOUNTER — Other Ambulatory Visit: Payer: Self-pay | Admitting: Family Medicine

## 2010-04-07 MED ORDER — VALSARTAN 80 MG PO TABS: ORAL_TABLET | ORAL | Status: DC

## 2010-04-13 ENCOUNTER — Telehealth: Payer: Self-pay | Admitting: *Deleted

## 2010-04-13 MED ORDER — HYDROCHLOROTHIAZIDE 50 MG PO TABS: 50.0000 mg | ORAL_TABLET | Freq: Every day | ORAL | Status: DC

## 2010-04-13 NOTE — Telephone Encounter (Signed)
I am set to do the seat lift for arthritis , but I need a formal back diagnosis to fill out the spinal orthosis brace form Does she have spinal stenosis or deg disc dz or herniated disc or spondylolisthesis ?  Let me know and I will get both forms going-thanks

## 2010-04-13 NOTE — Telephone Encounter (Signed)
Forms for seat lift mechanism and back brace are on your shelf.  I spoke to the patient and she does want these supplies.

## 2010-04-14 NOTE — Telephone Encounter (Signed)
Pt states she has not been formally diagnosed. When she stands for a long time or is walking for a long time she has pain in middle of back around waistline. Pt said if you cannot do the spinal brace don't worry about it.

## 2010-04-19 NOTE — Telephone Encounter (Signed)
Patient notified as instructed by telephone. Completed forms faxed to [phone] as instructed. Copy of form is at my desk and form was sent for scanning.

## 2010-04-19 NOTE — Telephone Encounter (Signed)
Forms filled out for seat lift mechanism Not for the back brace

## 2010-05-08 ENCOUNTER — Encounter: Payer: Self-pay | Admitting: Family Medicine

## 2010-05-12 ENCOUNTER — Ambulatory Visit (INDEPENDENT_AMBULATORY_CARE_PROVIDER_SITE_OTHER): Payer: Medicare Other | Admitting: Family Medicine

## 2010-05-12 ENCOUNTER — Ambulatory Visit (INDEPENDENT_AMBULATORY_CARE_PROVIDER_SITE_OTHER)
Admission: RE | Admit: 2010-05-12 | Discharge: 2010-05-12 | Disposition: A | Discharge status: Home / Self Care | Payer: Medicare Other | Source: Ambulatory Visit | Attending: Family Medicine | Admitting: Family Medicine

## 2010-05-12 ENCOUNTER — Encounter: Payer: Self-pay | Admitting: Family Medicine

## 2010-05-12 VITALS — BP 118/60 | HR 84 | Temp 98.0°F | Ht 58.25 in | Wt 145.5 lb

## 2010-05-12 DIAGNOSIS — R071 Chest pain on breathing: Secondary | ICD-10-CM

## 2010-05-12 DIAGNOSIS — E119 Type 2 diabetes mellitus without complications: Secondary | ICD-10-CM

## 2010-05-12 DIAGNOSIS — C50919 Malignant neoplasm of unspecified site of unspecified female breast: Secondary | ICD-10-CM

## 2010-05-12 DIAGNOSIS — I10 Essential (primary) hypertension: Secondary | ICD-10-CM

## 2010-05-12 DIAGNOSIS — D649 Anemia, unspecified: Secondary | ICD-10-CM

## 2010-05-12 DIAGNOSIS — M549 Dorsalgia, unspecified: Secondary | ICD-10-CM

## 2010-05-12 DIAGNOSIS — E785 Hyperlipidemia, unspecified: Secondary | ICD-10-CM

## 2010-05-12 DIAGNOSIS — R0789 Other chest pain: Secondary | ICD-10-CM

## 2010-05-12 LAB — POCT URINALYSIS DIPSTICK
Ketones, UA: NEGATIVE
Leukocytes, UA: NEGATIVE
Urobilinogen, UA: 0.2

## 2010-05-12 NOTE — Patient Instructions (Signed)
Chest x ray today Your mastectomy site looks good  I'm glad your back is doing better  Schedule fasting labs and follow up in 2-3 months please

## 2010-05-12 NOTE — Progress Notes (Signed)
Subjective:    Patient ID: Courtney Castillo, female    DOB: 04-19-1927, 77 y.o.   MRN: [id number]  HPI Here for back pain - about the same since she had her surgery also not sleeping well  Bothers her when she is on her feet a good while Takes tylenol and lies down -- in low back  Not to bothersome -- she is getting by   Still has some pain at site of mastectomy Still feels funny to her  No lumps or change in the way it looks  Her surgeon said that the discomfort is normal   Wt is down 10 lb Is eating ok - but does not fix regular meals - for one person  Appetite a bit lower  Has not been too active    ua neg today  It takes her a while to get to sleep- her mastectomy site bothers her to lie on it  Gets 5-6 hours of sleep per night overall   Dr Darnelle Catalan is her oncologist  Mastectomy is on the R    Past Medical History  Diagnosis Date  . Diabetes mellitus     type II  . Diverticulosis of colon   . Hyperlipidemia   . Hypertension   . Arthritis     osteoarthritis knees  . GERD (gastroesophageal reflux disease)     hiatal hernia  . Allergy     allergic rhinitis/ vasometer  . Lumbar spinal stenosis 05/2007  . 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 Anemia   . Cancer     Hx of breast CA & recurrent breast CA 11/11   Past Surgical History  Procedure Date  . Carpal tunnel release   . Eye surgery     cataract extraction  . Abdominal hysterectomy 03/1997    BSO stage 1B adenocarcinoma of endometruim  . Breast surgery     high grade inroductal CA  . Breast surgery     x2 with radiation  . Shoulder surgery     reports that she has never smoked. She does not have any smokeless tobacco history on file. She reports that she does not drink alcohol. Her drug history not on file. family history includes Diabetes in her mother; Heart disease in her mother; Hypertension in her father; and Kidney disease in her father. Allergies    Allergen Reactions  . Sulfamethoxazole W/Trimethoprim     REACTION: Unknown reaction it has been years since patient took it and she only knows it was a Sulfa drug she took.        Review of Systems Review of Systems  Constitutional: Negative for fever, appetite change, fatigue and unexpected weight change.  Eyes: Negative for pain and visual disturbance.  Respiratory: Negative for cough and shortness of breath.   Cardiovascular: Negative.   Gastrointestinal: Negative for nausea, diarrhea and constipation.  Genitourinary: Negative for urgency and frequency.  Skin: Negative for pallor.  Neurological: Negative for weakness, light-headedness, numbness and headaches.  MSK pos for back pain/ knee pain Hematological: Negative for adenopathy. Does not bruise/bleed easily.  Psychiatric/Behavioral: Negative for dysphoric mood. The patient is not nervous/anxious.          Objective:   Physical Exam  Constitutional: She appears well-developed and well-nourished.       Elderly well appearing female  HENT:  Head: Normocephalic and atraumatic.  Eyes: Conjunctivae and EOM are normal. Pupils are equal, round,  and reactive to light.  Neck: Normal range of motion. Neck supple. No JVD present. No thyromegaly present.  Cardiovascular: Normal rate, regular rhythm and normal heart sounds.   Pulmonary/Chest: Effort normal and breath sounds normal. She has no wheezes. She has no rales. She exhibits tenderness.       Tender over R mastectomy site- that looks well healed   Abdominal: Soft. Bowel sounds are normal. She exhibits no distension and no mass. There is no tenderness.  Musculoskeletal: She exhibits no tenderness.       Poor rom knees Mildly tender over lumbar incision on back  Nl rom spine   Neg slr    Lymphadenopathy:    She has no cervical adenopathy.  Neurological: She is alert. She has normal strength and normal reflexes. No sensory deficit. Coordination normal.  Skin: Skin is  warm and dry. No rash noted. No erythema. No pallor.  Psychiatric: She has a normal mood and affect.          Assessment & Plan:

## 2010-05-12 NOTE — Assessment & Plan Note (Signed)
Intermittent with hx of decompression LS spinal  Depends on activity  ? If would imp with PT Right now - pt happy treating with tylenol prn and will keep me updated No neurol s/s

## 2010-05-12 NOTE — Assessment & Plan Note (Signed)
Overall doing well s/p mastectomy with Dr Darnelle Catalan seeing her regularly Check cxr in light of chest wall pain

## 2010-05-12 NOTE — Assessment & Plan Note (Signed)
Mastectomy site pain - is bothering her more  Area is well healed but tender Check cxr and update Suspect this is normal for healing - pt voiced understanding

## 2010-06-01 NOTE — Discharge Summary (Signed)
NAMEXITLALY, AULT NO.:  [id number]   MEDICAL RECORD NO.:  [id number]          PATIENT TYPE:  INP   LOCATION:  1529                         FACILITY:  [facility]   PHYSICIAN:  Jene Every, M.D.    DATE OF BIRTH:  [birth year]   DATE OF ADMISSION:  08/02/2007  DATE OF DISCHARGE:  08/06/2007                               DISCHARGE SUMMARY   ADMISSION DIAGNOSIS:  1. Spinal stenosis L3-4 and L4-5.  2. Hypertension.  3. Gastroesophageal reflux disease.  4. Borderline diabetes.  5. History of breast cancer.  6. Hyperlipidemia.  7. Seasonal allergies.   DISCHARGE DIAGNOSIS:  1. Spinal stenosis 3-4,4-5.  2. Status post lumbar decompression.  3. Otherwise same as above.   PROCEDURE:  The patient was taken to the OR on August 02, 2007, underwent  central decompression L3-4, 4-5 with excision of synovial cysts at L3-4  on the right.   SURGEON:  Dr. Jene Every.   ASSISTANT:  James Aplington.   ANESTHESIA:  General.   COMPLICATIONS:  None.   HISTORY:  Ms. Krenzer is a pleasant 77 year old female who presented to  our office with disabling right lower extremity pain.  Studies revealed  fairly severe stenosis at 3-4 and 4-5 with synovial cysts compressing  the nerve root.  We felt at this point she would benefit from a lumbar  decompression.  The risks and benefits of this were discussed with the  patient, she does elect to proceed.   CONSULTS:  PT/OT, case management.   LABORATORY:  Preoperative labs showed a white cell count of 5.8,  hemoglobin 10.1, hematocrit 29.1, CBG of 105 preoperatively.  Routine  chemistries done showed sodium 135, potassium 4, glucose of 96, BUN of  4, creatinine 0.72.  No labs were obtained during the hospital stay.  Vital signs remained stable.  Preoperative chest x-ray showed no  definite active process, possible atelectasis or scarring in the lingula  were noted, they did recommend follow-up chest x-ray in the future.   HOSPITAL  COURSE:  The patient was admitted, taken to the OR and  underwent the above stated procedure without difficulty.  She was  transferred to the PACU and then to the Orthopedic Floor for continuing  postoperative care.  Postoperatively the patient did fairly well, she  noted some low back soreness as expected but noted the improvement in  lower extremity pain.  Foley was discontinued postoperative day #1 once  the patient got out of bed.  She did quite well with her therapy.  She  was ambulating with assistance.  Vital signs remained stable throughout  the hospital course.  She denied any headache; no fever, chills or chest  pain.  Pain was controlled on p.o. analgesics.  The patient was taking  good p.o.  It was felt on postoperative day #4 the patient was stable to  be discharged to facility of choice.   DISPOSITION:  The patient discharged to skilled nursing facility for  continued rehab.   WOUND CARE:  Change dressing daily, keep this area clean and dry.   FOLLOW UP:  She needs to follow up with Dr. Shelle Iron in approximately 2  weeks for suture removal as well as progression of her activity.  She  also needs to follow up with her primary care physician.  Followup on  her abnormal chest x-ray.   ACTIVITY:  She is to walk as tolerated utilizing proper back  precautions.  No bending, stooping, twisting or lifting.  She is okay to  ambulate with a walker.   DISCHARGE MEDICATIONS:  1. Diovan 120 mg one p.o. daily.  2. Travatan eye drops one each eye nightly.  3. HCTZ 50 mg one p.o. daily.  4. Vitamin C 500 mg one p.o. daily.  5. Colace 100 mg one p.o. b.i.d.  6. Vicodin one to two p.o. q. 4-6 p.r.n. pain.  7. Robaxin one p.o. q. 8 p.r.n. spasm.  8. Detrol 2 mg one p.o. daily p.r.n.  9. Multivitamin with iron.   DIET:  Low-cal, low-carb secondary to borderline diabetes.   CONDITION ON DISCHARGE:  Stable.   FINAL DIAGNOSIS:  Doing well status post central decompression L3-4 and   4-5.      Roma Schanz, P.A.      Jene Every, M.D.  Electronically Signed    CS/MEDQ  D:  08/06/2007  T:  08/06/2007  Job:  [id number]

## 2010-06-01 NOTE — H&P (Signed)
Courtney Castillo, ROMEY NO.:  [id number]   MEDICAL RECORD NO.:  [id number]          PATIENT TYPE:  INP   LOCATION:                               FACILITY:  [facility]   PHYSICIAN:  Jene Every, M.D.    DATE OF BIRTH:  [birth year]   DATE OF ADMISSION:  08/02/2007  DATE OF DISCHARGE:                              HISTORY & PHYSICAL   CHIEF COMPLAINTS:  Right buttock and lower extremity pain, especially  with transition.   HISTORY:  Courtney Castillo is a pleasant 77 year old female who first  presented to [place] in April.  She states she turned wrong in bed and noted  onset of symptoms that had gotten progressively worse.  We initially  tried conservative treatment that patient failed.  We proceeded with an  MRI study which revealed fairly significant spinal stenosis as well as a  synovial cyst at L3-4.  We then proceeded with a myelogram, which did  show severe stenosis at 3-4 and 4-5.  The patient continues to note her  pain on a scale of 1-10 to be a 50.  It is felt at this time given the  duration of her symptoms and the severity of her radiculopathy, and the  presence of positive neural tension signs, that she would benefit from a  lumbar decompression at 2 levels with removal of synovial cyst.  The  risks and benefits of this were discussed with the patient and medical  clearance was obtained.  She does elect to proceed.   MEDICAL HISTORY:  Significant for hypertension, gastroesophageal reflux  disease, borderline diabetes, history of breast cancer, hyperlipidemia,  seasonal allergies.   CURRENT MEDICATIONS:  Include HCTZ 50 mg 1 p.o. daily, Protonix 40 mg 1  p.o. daily, Darvocet 1 p.o. q. 4-6 p.r.n. pain, Diovan 80 mg one and  half p.o. daily, Trovan eye drops 1 drop each eye at bedtime, Tylenol  Extra Strength, calcium, aspirin 81 mg daily, Centrum Silver, Detrol 2  mg p.r.n.   ALLERGIES:  SULFA, PREVACID, EVISTA, FOSAMAX, VIOXX, CLARITIN, ZOCOR,  ALTACE, MECLIZINE,  AMLODIPINE, TEVETEN.   PAST SURGERIES:  Carpal tunnel release, cataract surgery, hysterectomy,  breast biopsy with high-grade intraductal carcinoma, breast surgery x2  with radiation, shoulder surgery.   SOCIAL HISTORY:  The patient is widowed.  She is retired.  She denies  any tobacco or alcohol consumption.  She is planning to go to [place]  [facility] following her surgery.   PRIMARY CARE PHYSICIAN:  Dr. Milinda Antis at [facility].   FAMILY HISTORY:  Significant for coronary artery disease.   REVIEW OF SYSTEMS:  GENERAL:  The patient denies any fever, chills,  night sweats or bleeding tendencies.  CNS: No blurred or double vision,  seizure, headache or paralysis.  RESPIRATORY:  No shortness of breath,  productive cough or hemoptysis.  CARDIOVASCULAR:  No orthopnea.  GU:  No  dysuria, hematuria, or discharge.  GI: No nausea, vomiting, diarrhea,  constipation, melena, or bloody stools.  The rest of the review of  systems as per the history of present illness.   PHYSICAL EXAMINATION:  Height is 5 feet, weight is 164, pulse is 60,  respiratory rate 10, BP 142/76.  GENERAL:  This is an elderly female who does walk with an antalgic gait  utilizing a cane.  She is slightly anxious.  HEENT:  Atraumatic, normocephalic.  Pupils equal round and reactive to  light.  EOMs intact.  NECK:  Supple.  No lymphadenopathy.  CHEST:  Clear to auscultation bilaterally.  No rhonchi, wheezes or  rales.  BREASTS:  As per HPI.  HEART:  Regular rate and rhythm without murmurs, gallops or rubs.  ABDOMEN:  Soft and nontender.  Bowel sounds x4.  SKIN:  No rashes or lesions are noted.  EXTREMITIES:  She has positive straight leg raise bilaterally, right  greater than left.  Motor is 5-/5.  She has decreased sensation along  the L5 dermatome.   IMPRESSION:  Severe stenosis L3-3 and L4-5.   PLAN:  The patient will be admitted to undergo lumbar decompression of  L3-4 and L4-5 with removal of synovial cyst.       Roma Schanz, P.A.      Jene Every, M.D.  Electronically Signed    CS/MEDQ  D:  07/31/2007  T:  07/31/2007  Job:  [id number]

## 2010-06-01 NOTE — Op Note (Signed)
Courtney Castillo, BOWELL NO.:  [id number]   MEDICAL RECORD NO.:  [id number]          PATIENT TYPE:  INP   LOCATION:  0003                         FACILITY:  [facility]   PHYSICIAN:  Jene Every, M.D.    DATE OF BIRTH:  May 05, 1927   DATE OF PROCEDURE:  DATE OF DISCHARGE:                               OPERATIVE REPORT   PREOPERATIVE DIAGNOSIS:  Spinal stenosis L3-4, L4-5.   POSTOPERATIVE DIAGNOSIS:  Spinal stenosis L3-4, L4-5.   PROCEDURE PERFORMED:  1. Central decompression L3-4, L4-5 by laminectomy of 3 and 4,      bilateral lateral recess decompression foraminotomies of L4 and L5.  2. Excision of synovial cyst 3-4 right.   ANESTHESIA:  General.   SURGEON:  Jene Every, M.D.   ASSISTANT:  Marlowe Kays, M.D.   BRIEF HISTORY:  A 77 year old with right lower extremity radiculopathy,  stenosis 3-4 and 4-5 and synovial cyst compressing the root.  Operative  intervention was indicated for decompression of 34 and 4-5 and excision  of synovial cyst.  Risks and benefits were discussed including bleeding,  infection, hemorrhage, vascular injury, CSF leak, adjacent segment  disease, need for fusion in the future, anesthetic complications, etc.   PROCEDURE IN DETAIL:  With the patient in supine position after  induction of adequate anesthesia and 1 gram of Kefzol she was placed  prone on Andrews frame.  All bony prominences well-padded.  Lumbar  region prepped and draped in the usual sterile fashion.  Two 18 gauge  spinal needles were utilized to localize the 4-5 interspace and 3-4  interspace confirmed with x-ray.  Incision was made from the spinous  process of 3 to 5 and subcutaneous tissue was dissected.  Electrocautery  utilized to achieve hemostasis.  Paraspinous muscle elevated from the  lamina.  Kochers were placed on the spinous processes of 4 and between 3-  4 confirmed by x-ray.  We removed the spinous processes 3 and 4 and  partially of 5.  I then  performed central laminectomies of 4 and of 3  utilizing 2 mm Kerrison centrally from the 4-5 space from caudad to  cephalad using neural patties to protect neural elements.  Significant  lateral recess stenosis was noted bilaterally at 3-4 and at 4-5, both  were decompressed at the medial border of the pedicle after the  operating microscope was draped and brought on the surgical field.  At 3-  4 on the right there was a synovial cyst at the area of the thecal sac.  This was gently mobilized and separated from the thecal sac without  dural rent.  We then removed the synovial cyst with its overlying facet  osteophyte.  Greater than 50% of both facets were preserved at 3-4 and 4-  5.  This tissue was sent for pathology.   Next, hockey stick probe placed in the foramen at 3, 4 and 5 bilaterally  and found to be patent.  Bipolar cautery was utilized to achieve  hemostasis.  There was no CSF leaks or active bleeding.  We then placed  thrombin-soaked Gelfoam in the  laminotomy defects.  McCullough retractor  was removed and irrigated.  Paraspinous muscle evaluated and no active  bleeding.  Dorsolumbar fascia were reapproximated with #1 Vicryl figure-  of-eight suture.  Subcutaneous tissue reapproximated with 2-0 Vicryl simple closure.  Skin  was reapproximated with staples and dressed sterilely.  Awakened without  difficulty and transported to the recovery room in satisfactory  condition.  The patient tolerated the procedure with no complications.  Blood loss 100 mL.      Jene Every, M.D.  Electronically Signed     JB/MEDQ  D:  08/02/2007  T:  08/02/2007  Job:  [id number]

## 2010-06-07 ENCOUNTER — Encounter (INDEPENDENT_AMBULATORY_CARE_PROVIDER_SITE_OTHER): Payer: Self-pay | Admitting: Surgery

## 2010-06-15 ENCOUNTER — Encounter (HOSPITAL_BASED_OUTPATIENT_CLINIC_OR_DEPARTMENT_OTHER): Payer: Medicare Other | Admitting: Oncology

## 2010-06-15 ENCOUNTER — Other Ambulatory Visit: Payer: Self-pay | Admitting: Oncology

## 2010-06-15 DIAGNOSIS — C50619 Malignant neoplasm of axillary tail of unspecified female breast: Secondary | ICD-10-CM

## 2010-06-15 DIAGNOSIS — C50919 Malignant neoplasm of unspecified site of unspecified female breast: Secondary | ICD-10-CM

## 2010-06-15 LAB — CBC WITH DIFFERENTIAL/PLATELET
Basophils Absolute: 0.1 10*3/uL (ref 0.0–0.1)
EOS%: 2.8 % (ref 0.0–7.0)
HCT: 34.2 % — ABNORMAL LOW (ref 34.8–46.6)
HGB: 11.9 g/dL (ref 11.6–15.9)
MCH: 31.3 pg (ref 25.1–34.0)
MONO#: 1 10*3/uL — ABNORMAL HIGH (ref 0.1–0.9)
NEUT%: 49.9 % (ref 38.4–76.8)
Platelets: 238 10*3/uL (ref 145–400)
lymph#: 2.5 10*3/uL (ref 0.9–3.3)

## 2010-06-18 ENCOUNTER — Other Ambulatory Visit: Payer: Self-pay

## 2010-06-18 NOTE — Telephone Encounter (Signed)
Received faxed request from [facility] for Meclizine 25mg  #60 with instructions 1/2 tablet by mouth two times a day as needed for dizziness.

## 2010-06-20 MED ORDER — MECLIZINE HCL 25 MG PO TABS: 12.5000 mg | ORAL_TABLET | Freq: Two times a day (BID) | ORAL | Status: DC | PRN

## 2010-06-20 NOTE — Telephone Encounter (Signed)
Px written for call in   

## 2010-06-21 ENCOUNTER — Encounter: Payer: Self-pay | Admitting: Family Medicine

## 2010-06-21 ENCOUNTER — Ambulatory Visit (INDEPENDENT_AMBULATORY_CARE_PROVIDER_SITE_OTHER): Payer: Medicare Other | Admitting: Family Medicine

## 2010-06-21 DIAGNOSIS — R42 Dizziness and giddiness: Secondary | ICD-10-CM

## 2010-06-21 DIAGNOSIS — M129 Arthropathy, unspecified: Secondary | ICD-10-CM

## 2010-06-21 DIAGNOSIS — R0789 Other chest pain: Secondary | ICD-10-CM

## 2010-06-21 DIAGNOSIS — R071 Chest pain on breathing: Secondary | ICD-10-CM

## 2010-06-21 MED ORDER — SERTRALINE HCL 25 MG PO TABS: 25.0000 mg | ORAL_TABLET | Freq: Every day | ORAL | Status: DC

## 2010-06-21 NOTE — Patient Instructions (Signed)
Stop zocor/ simvastatin -- cholesterol med (? If this may cause some aches and pains) Start zoloft 25 mg each evening for depression and anxiety- if any side effects or you feel worse stop it and call Your surgical site looks good Follow up with me in 1 month

## 2010-06-21 NOTE — Telephone Encounter (Signed)
Medication phoned to [facility] as instructed.  

## 2010-06-21 NOTE — Assessment & Plan Note (Signed)
Still having discomfort at mastectomy site Nl exam today  Will send copy of note to Dr Jamey Ripa

## 2010-06-21 NOTE — Progress Notes (Signed)
Subjective:    Patient ID: Courtney Castillo, female    DOB: 1927/07/27, 77 y.o.   MRN: [id number]  HPI Here to get checked out -- per adv of Dr Darnelle Catalan  ? Concerned about anything  She saw him and ? Needs to check surgical site  She saw him 4 weeks ago  Was originally seen for pain at surgical site  That is slightly improved -- but still quite uncomfortable with movement of R arm  Saw Dr Jamey Ripa - her surgeon in April -- and his note indicates this will likely be her baseline   Going back to bed in morning  Hard to get to sleep at night  Is anxious on and off  Is feeling blah in general  Main problem to ruminate on is her incisional pain  Feels very alone - no one left to help her with anything   Dizziness (chronic) comes and goes - no change/ has had workups in the past   Generally aches all over  This feels different from PMR however  Just stiff more than anything  Patient Active Problem List  Diagnoses  . DIABETES MELLITUS, TYPE II  . HYPERLIPIDEMIA  . ANEMIA  . CATARACTS  . HYPERTENSION  . ALLERGIC RHINITIS  . REACTIVE AIRWAY DISEASE  . GERD  . DIVERTICULOSIS, COLON  . OSTEOARTHRITIS  . ARTHROPATHY NOS, MULTIPLE SITES  . POLYMYALGIA RHEUMATICA  . DIZZINESS  . MEMORY LOSS  . BREAST CANCER, HX OF  . HX, PERSONAL, ARTHRITIS  . Chest wall pain  . Breast cancer  . Back pain   Past Medical History  Diagnosis Date  . Diabetes mellitus     type II  . Diverticulosis of colon   . Hyperlipidemia   . Hypertension   . Arthritis     osteoarthritis knees  . GERD (gastroesophageal reflux disease)     hiatal hernia  . Allergy     allergic rhinitis/ vasometer  . Lumbar spinal stenosis 05/2007  . 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 Anemia   . Cancer     Hx of breast CA & recurrent breast CA 11/11   Past Surgical History  Procedure Date  . Carpal tunnel release   . Eye surgery     cataract extraction  . Abdominal  hysterectomy 03/1997    BSO stage 1B adenocarcinoma of endometruim  . Breast surgery     high grade inroductal CA  . Breast surgery     x2 with radiation  . Shoulder surgery   . Back surgery 2009  . Tympanoplasty 1996   History  Substance Use Topics  . Smoking status: Never Smoker   . Smokeless tobacco: Not on file  . Alcohol Use: No   Family History  Problem Relation Age of Onset  . Diabetes Mother   . Heart disease Mother     MI  . Kidney disease Father   . Hypertension Father    Allergies  Allergen Reactions  . Sulfamethoxazole W/Trimethoprim     REACTION: Unknown reaction it has been years since patient took it and she only knows it was a Sulfa drug she took.   Current Outpatient Prescriptions on File Prior to Visit  Medication Sig Dispense Refill  . aspirin 81 MG tablet Take 81 mg by mouth daily. with meal       . Biotin 1000 MCG tablet Take 1,000 mcg by mouth as directed.        Marland Kitchen  fexofenadine (ALLEGRA) 60 MG tablet Take 60 mg by mouth daily as needed.        . hydrochlorothiazide 50 MG tablet Take 1 tablet (50 mg total) by mouth daily.  30 tablet  2  . meclizine (ANTIVERT) 25 MG tablet Take 0.5 tablets (12.5 mg total) by mouth 2 (two) times daily as needed for dizziness.  30 tablet  1  . meloxicam (MOBIC) 15 MG tablet Take 15 mg by mouth daily as needed. with meal       . Multiple Vitamin (MULTI-VITAMIN PO) Take by mouth.        . solifenacin (VESICARE) 5 MG tablet Take 5 mg by mouth daily.        . valsartan (DIOVAN) 80 MG tablet Take one and one half tablets by mouth every morning.  45 tablet  6  . mometasone (NASONEX) 50 MCG/ACT nasal spray by Nasal route as directed.        . pantoprazole (PROTONIX) 40 MG tablet Take 40 mg by mouth daily as needed.       . travoprost, benzalkonium, (TRAVATAN) 0.004 % ophthalmic solution as directed.               Review of Systems Review of Systems  Constitutional: Negative for fever, appetite change, fatigue and  unexpected weight change.  Eyes: Negative for pain and visual disturbance.  Respiratory: Negative for cough and shortness of breath.   Cardiovascular: Negative.for sob/ nausea or sweating or exertional symptoms    Gastrointestinal: Negative for nausea, diarrhea and constipation.  Genitourinary: Negative for urgency and frequency.  Skin: Negative for pallor or rash MSK pos for stiff joints .  Neurological: Negative for weakness,s, numbness and headaches. pos for chronic dizziness- improved lately Hematological: Negative for adenopathy. Does not bruise/bleed easily.  Psychiatric/Behavioral: Negative for dysphoric mood. The patient is generally anxious.          Objective:   Physical Exam  Constitutional: She appears well-developed and well-nourished. No distress.       overwt and well appearing   HENT:  Head: Normocephalic and atraumatic.  Right Ear: External ear normal.  Left Ear: External ear normal.  Nose: Nose normal.  Mouth/Throat: Oropharynx is clear and moist.  Eyes: Conjunctivae and EOM are normal. Pupils are equal, round, and reactive to light.  Neck: Normal range of motion. Neck supple. No JVD present. Carotid bruit is not present. No thyromegaly present.  Cardiovascular: Normal rate, regular rhythm, normal heart sounds and intact distal pulses.   Pulmonary/Chest: Effort normal and breath sounds normal. No respiratory distress. She has no wheezes.  Abdominal: Soft. Bowel sounds are normal. She exhibits no distension, no abdominal bruit and no mass. There is no tenderness. There is no guarding.  Genitourinary:       Mastectomy site on R is well healed - nontender No new findings No CW tenderness overall   Musculoskeletal: She exhibits no edema and no tenderness.       Generally poor rom hips and knees and stiffness   Lymphadenopathy:    She has no cervical adenopathy.  Neurological: She is alert. She has normal reflexes. No cranial nerve deficit. Coordination normal.        Is generally forgetful but answers all questions appropriately and retains good sense of humor   Skin: Skin is warm and dry. No rash noted. No erythema. No pallor.  Psychiatric:       Mildly anxious - which is her baseline  Assessment & Plan:

## 2010-06-21 NOTE — Assessment & Plan Note (Addendum)
Pt continues to complain about all over body pain esp in legs Stop zocor for 1 mo until follow up then disc  Pt does not feel like this is her pmr Has a lot of joint stiffness- suspect OA

## 2010-06-24 NOTE — Assessment & Plan Note (Signed)
Chronic and not bad lately We disc safety again  This has been worked up multiple times

## 2010-06-29 ENCOUNTER — Telehealth: Payer: Self-pay

## 2010-06-29 NOTE — Telephone Encounter (Signed)
Message copied by Patience Musca on Tue Jun 29, 2010  8:16 AM ------      Message from: Roxy Manns A      Created: Thu Jun 24, 2010 11:01 AM       Please send copy of her last office note to her surgeon Dr Jamey Ripa      Thanks

## 2010-06-29 NOTE — Telephone Encounter (Signed)
Faxed requested office noted to Dr Jamey Ripa as instructed.

## 2010-07-02 ENCOUNTER — Telehealth: Payer: Self-pay

## 2010-07-02 NOTE — Telephone Encounter (Signed)
I just did this yesterday-- it went out of my box this am (probably Lyla Son picked it up)

## 2010-07-02 NOTE — Telephone Encounter (Signed)
[employer]  A diabetic supplier faxed form for completion.On your shelf in the in box.

## 2010-07-13 ENCOUNTER — Other Ambulatory Visit: Payer: Medicare Other

## 2010-07-17 ENCOUNTER — Other Ambulatory Visit: Payer: Self-pay

## 2010-07-17 MED ORDER — SOLIFENACIN SUCCINATE 5 MG PO TABS: 5.0000 mg | ORAL_TABLET | Freq: Every day | ORAL | Status: DC

## 2010-07-17 NOTE — Telephone Encounter (Signed)
[facility] faxed refill request for Vesicare 5mg  #30 x5 refills.

## 2010-07-23 ENCOUNTER — Ambulatory Visit: Payer: Medicare Other | Admitting: Family Medicine

## 2010-07-26 ENCOUNTER — Ambulatory Visit (INDEPENDENT_AMBULATORY_CARE_PROVIDER_SITE_OTHER): Payer: Medicare Other | Admitting: Family Medicine

## 2010-07-26 ENCOUNTER — Encounter: Payer: Self-pay | Admitting: Family Medicine

## 2010-07-26 VITALS — BP 142/72 | HR 84 | Temp 98.0°F | Ht 58.25 in | Wt 143.8 lb

## 2010-07-26 DIAGNOSIS — M25569 Pain in unspecified knee: Secondary | ICD-10-CM

## 2010-07-26 DIAGNOSIS — R413 Other amnesia: Secondary | ICD-10-CM

## 2010-07-26 LAB — CBC
Hemoglobin: 11.7 g/dL — ABNORMAL LOW (ref 12.0–15.0)
MCH: 30.3 pg (ref 26.0–34.0)
MCHC: 33.1 g/dL (ref 30.0–36.0)
Platelets: 251 10*3/uL (ref 150–400)
RDW: 12.8 % (ref 11.5–15.5)

## 2010-07-26 LAB — FOLATE: Folate: 25.3 ng/mL (ref >5.9)

## 2010-07-26 LAB — SEDIMENTATION RATE: Sed Rate: 36 mm/hr — ABNORMAL HIGH (ref 0–22)

## 2010-07-26 LAB — VITAMIN B12: Vitamin B-12: 275 pg/mL (ref 211–911)

## 2010-07-26 LAB — RPR

## 2010-07-26 LAB — TSH: TSH: 0.94 u[IU]/mL (ref 0.35–5.50)
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
## 2010-07-26 NOTE — Patient Instructions (Signed)
We will do referral to orthopedics (Dr Jillyn Hidden) at check out for knee pain  Labs today for memory loss Follow up in 1-2 months (30 min visit ) -- to discuss memory

## 2010-07-26 NOTE — Assessment & Plan Note (Signed)
Worsening OA in knees On mobic  Tylenol ok - disc safe amt to take  Ref to ortho for further eval - would likely benefit from inj again  No imp stopping zocor so back on it

## 2010-07-26 NOTE — Assessment & Plan Note (Signed)
Per pt worsening  Also with lonlieness and poss component of depression - disc this in length Dementia profile today Then f/u for visit and MMS Neighbor's input today helpful Enc to consider asst living  Has medi alert button

## 2010-07-30 ENCOUNTER — Telehealth: Payer: Self-pay | Admitting: *Deleted

## 2010-07-30 NOTE — Telephone Encounter (Signed)
Received fax stating that patient needs authorization for diabetic supplies.  Form is in your IN box.

## 2010-07-30 NOTE — Telephone Encounter (Signed)
Done and in IN box 

## 2010-07-30 NOTE — Telephone Encounter (Signed)
Completed form faxed to [phone] as instructed. Copy of form given to Jacki Cones since prior auth and copy sent to be scanned.

## 2010-09-13 ENCOUNTER — Encounter (INDEPENDENT_AMBULATORY_CARE_PROVIDER_SITE_OTHER): Payer: Self-pay | Admitting: Surgery

## 2010-09-13 ENCOUNTER — Ambulatory Visit (INDEPENDENT_AMBULATORY_CARE_PROVIDER_SITE_OTHER): Payer: Medicare Other | Admitting: Family Medicine

## 2010-09-13 ENCOUNTER — Encounter: Payer: Self-pay | Admitting: Family Medicine

## 2010-09-13 VITALS — BP 140/70 | HR 96 | Temp 97.8°F | Ht 58.25 in | Wt 144.2 lb

## 2010-09-13 DIAGNOSIS — R413 Other amnesia: Secondary | ICD-10-CM

## 2010-09-13 MED ORDER — DONEPEZIL HCL 5 MG PO TABS: 5.0000 mg | ORAL_TABLET | Freq: Every day | ORAL | Status: DC

## 2010-09-13 NOTE — Progress Notes (Signed)
Subjective:    Patient ID: Courtney Castillo, female    DOB: Jan 25, 1927, 77 y.o.   MRN: [id number]  HPI Here for f/u of memory loss   Last visit here she had a neighbor to help out  Still having trouble - and almost forgot this appt Also does not always remember her medicines   POA is a nephew-- She was here with Scarlette Calico - sister in law (lives close - but cares for sick husband)   Labs were overall reassuring  Hb 11.7- stable Sed rate a little elevated at 36 (no headache or other symptoms - but does have significant OA and back problems) B12 low normal and nl folate and rpr  No complaints of mood disorder   Her father had alzheimers  She is concerned about memory Also strongly refuses to move out of her house into assisted living  Really has no one to help her   Here to do MMS exam today  (see scanned sheet)  Patient Active Problem List  Diagnoses  . DIABETES MELLITUS, TYPE II  . HYPERLIPIDEMIA  . ANEMIA  . CATARACTS  . HYPERTENSION  . ALLERGIC RHINITIS  . REACTIVE AIRWAY DISEASE  . GERD  . DIVERTICULOSIS, COLON  . OSTEOARTHRITIS  . ARTHROPATHY NOS, MULTIPLE SITES  . POLYMYALGIA RHEUMATICA  . DIZZINESS  . MEMORY LOSS  . Hx Breast cancer ([facility]) Right, Stage one  . HX, PERSONAL, ARTHRITIS  . Chest wall pain  . Breast cancer  . Back pain  . Knee pain   Past Medical History  Diagnosis Date  . Diabetes mellitus     type II  . Diverticulosis of colon   . Hyperlipidemia   . Hypertension   . Arthritis     osteoarthritis knees  . GERD (gastroesophageal reflux disease)     hiatal hernia  . Allergy     allergic rhinitis/ vasometer  . Lumbar spinal stenosis 05/2007  . 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 Anemia   . Cancer     Hx of breast CA & recurrent breast CA 11/11   Past Surgical History  Procedure Date  . Carpal tunnel release   . Eye surgery     cataract extraction  . Abdominal hysterectomy 03/1997    BSO  stage 1B adenocarcinoma of endometruim  . Mastectomy w/ nodes partial 12/14/2009    [facility] Right, on  . Incisional breast biopsy 10/16/2009  . Shoulder surgery   . Back surgery 2009  . Tympanoplasty 1996  . Mastectomy partial / lumpectomy 12/28/1998    NL lumpectomy  . Mastectomy partial / lumpectomy 01/07/2010    Re-excision for + margin   History  Substance Use Topics  . Smoking status: Never Smoker   . Smokeless tobacco: Not on file  . Alcohol Use: No   Family History  Problem Relation Age of Onset  . Diabetes Mother   . Heart disease Mother     MI  . Kidney disease Father   . Hypertension Father    Allergies  Allergen Reactions  . Sulfamethoxazole W/Trimethoprim     REACTION: Unknown reaction it has been years since patient took it and she only knows it was a Sulfa drug she took.   Current Outpatient Prescriptions on File Prior to Visit  Medication Sig Dispense Refill  . aspirin 81 MG tablet Take 81 mg by mouth daily. with meal       .  Biotin 1000 MCG tablet Take 1,000 mcg by mouth as directed.        . hydrochlorothiazide 50 MG tablet Take 1 tablet (50 mg total) by mouth daily.  30 tablet  2  . meclizine (ANTIVERT) 25 MG tablet Take 0.5 tablets (12.5 mg total) by mouth 2 (two) times daily as needed for dizziness.  30 tablet  1  . Multiple Vitamin (MULTI-VITAMIN PO) Take by mouth.        . pantoprazole (PROTONIX) 40 MG tablet Take 40 mg by mouth daily as needed.       . valsartan (DIOVAN) 80 MG tablet Take one and one half tablets by mouth every morning.  45 tablet  6  . fexofenadine (ALLEGRA) 60 MG tablet Take 60 mg by mouth daily as needed.        . meloxicam (MOBIC) 15 MG tablet Take 15 mg by mouth daily as needed. with meal       . mometasone (NASONEX) 50 MCG/ACT nasal spray by Nasal route as directed.        . sertraline (ZOLOFT) 25 MG tablet Take 1 tablet (25 mg total) by mouth daily.  30 tablet  11  . solifenacin (VESICARE) 5 MG tablet Take 1 tablet (5 mg total) by  mouth daily.  30 tablet  5  . travoprost, benzalkonium, (TRAVATAN) 0.004 % ophthalmic solution as directed.             Review of Systems ROS. Review of Systems  Constitutional: Negative for fever, appetite change, fatigue and unexpected weight change.  Eyes: Negative for pain and visual disturbance.  Respiratory: Negative for cough and shortness of breath.   Cardiovascular: Negative. For cp or palpitations  Gastrointestinal: Negative for nausea, diarrhea and constipation.  Genitourinary: Negative for urgency and frequency.  Skin: Negative for pallor. or rash  Neurological: Negative for weakness, light-headedness, numbness and headaches.  MSK pos for OA and chronic back pain  Hematological: Negative for adenopathy. Does not bruise/bleed easily.  Psychiatric/Behavioral: Negative for dysphoric mood. The patient is generally anxious .         Objective:   Physical Exam  Constitutional: She appears well-developed and well-nourished. No distress.       Somewhat frail appearing elderly female who walks slowly  HENT:  Head: Normocephalic and atraumatic.  Mouth/Throat: Oropharynx is clear and moist.  Eyes: Conjunctivae and EOM are normal. Pupils are equal, round, and reactive to light.  Neck: Normal range of motion. Neck supple. No JVD present. Carotid bruit is not present. No thyromegaly present.  Cardiovascular: Normal rate, regular rhythm, normal heart sounds and intact distal pulses.   Pulmonary/Chest: Effort normal and breath sounds normal. No respiratory distress. She has no wheezes.  Abdominal: Soft. Bowel sounds are normal. She exhibits no distension and no mass. There is no tenderness.  Musculoskeletal: Normal range of motion. She exhibits no edema and no tenderness.  Lymphadenopathy:    She has no cervical adenopathy.  Neurological: She is alert. She has normal strength and normal reflexes. No cranial nerve deficit or sensory deficit. Coordination normal.       No tremor or  neurologic changes  Skin: Skin is warm and dry. No pallor.  Psychiatric: She has a normal mood and affect.       Quite pleasant  Scores higher than expected on MMS exam  Is generally nervous           Assessment & Plan:

## 2010-09-13 NOTE — Assessment & Plan Note (Signed)
Reviewed labs and no organic cause seen for memory loss Did disc poss early dementia and pt given literature on it  At this point medicine dosing is most impt  MMS score of 29- better than expected with fairly good clock draw also  Will contact her power of attourney- nephew when we get his number and she signs release Start aricept 5 mg at night- will call if side eff F/u planned 1-2 mo   >25 min spent with face to face with patient, >50% counseling and/or coordinating care

## 2010-09-13 NOTE — Patient Instructions (Addendum)
Start aricept at night - it is to slow down memory loss If it makes you nauseated or you have side effects - let me know  Please sign a release up front on the way out so I can talk to your nephew (power of attourney) about your memory problems Please call us back with his phone number  At any time if he could come for a visit with you it would be helpful  Keep your brain active with word puzzles and reading / staying active in general  Follow up with me in 1-2 months and we may increase the aricept

## 2010-09-14 ENCOUNTER — Encounter (INDEPENDENT_AMBULATORY_CARE_PROVIDER_SITE_OTHER): Payer: Self-pay | Admitting: Surgery

## 2010-09-14 ENCOUNTER — Ambulatory Visit (INDEPENDENT_AMBULATORY_CARE_PROVIDER_SITE_OTHER): Payer: Medicare Other | Admitting: Surgery

## 2010-09-14 VITALS — BP 146/62 | HR 88

## 2010-09-14 DIAGNOSIS — Z853 Personal history of malignant neoplasm of breast: Secondary | ICD-10-CM

## 2010-09-14 NOTE — Progress Notes (Signed)
09/14/2010  Courtney Castillo is a 77 y.o.female who presents for routine followup of her Right breast cancer diagnosed in 2011 and treated with Mastectomy. She has no problems or concerns on either side.  PFSH She has had no significant changes since the last visit here.  ROS There have been no significant changes since the last visit here  General: The patient is alert, oriented, generally healty appearing, NAD. Mood and affect are normal.  Breasts:  The right breast is surgically absent. The skin is fairly tight because of the radiation therapy that she had after her original lumpectomy. It remains a little bit red, but this is unchanged. There is no evidence of skin nodules or local recurrence. The left breast is normal to inspection and palpation.   She is developing some cording towards the right axilla. Lymphatics: She has no axillary or supraclavicular adenopathy on either side.  Extremities: Full ROM of the surgical side with no lymphedema noted.  Data Reviewed: Reviewed  Notes in Epic  Impression: Doing well, with no evidence of recurrent cancer or new cancer  Plan: Will continue to follow up on an annual basis here.

## 2010-09-16 ENCOUNTER — Encounter: Payer: Self-pay | Admitting: Family Medicine

## 2010-09-16 ENCOUNTER — Ambulatory Visit
Admission: RE | Admit: 2010-09-16 | Discharge: 2010-09-16 | Disposition: A | Discharge status: Home / Self Care | Payer: Medicare Other | Source: Ambulatory Visit | Attending: Oncology | Admitting: Oncology

## 2010-09-16 DIAGNOSIS — C50919 Malignant neoplasm of unspecified site of unspecified female breast: Secondary | ICD-10-CM

## 2010-10-01 ENCOUNTER — Telehealth: Payer: Self-pay

## 2010-10-01 NOTE — Telephone Encounter (Signed)
Done and in IN box 

## 2010-10-01 NOTE — Telephone Encounter (Signed)
[facility] faxed form to be filled out for diabetic testing supplies. Form is on your shelf in the in box.

## 2010-10-01 NOTE — Telephone Encounter (Signed)
Faxed and sent to scan.

## 2010-10-14 LAB — COMPREHENSIVE METABOLIC PANEL
CO2: 28
Calcium: 9.7
Creatinine, Ser: 0.76
GFR calc Af Amer: >60
GFR calc non Af Amer: >60
Glucose, Bld: 150 — ABNORMAL HIGH

## 2010-10-14 LAB — URINALYSIS, ROUTINE W REFLEX MICROSCOPIC
Ketones, ur: NEGATIVE
Nitrite: NEGATIVE
Protein, ur: NEGATIVE

## 2010-10-14 LAB — CBC
Hemoglobin: 11.9 — ABNORMAL LOW
MCHC: 35.3
MCV: 92.8
RBC: 3.65 — ABNORMAL LOW

## 2010-10-14 LAB — PROTIME-INR: Prothrombin Time: 13.8

## 2010-10-14 LAB — APTT: aPTT: 28

## 2010-10-15 LAB — CBC
HCT: 29.1 — ABNORMAL LOW
Hemoglobin: 10.1 — ABNORMAL LOW
RBC: 3.1 — ABNORMAL LOW
RDW: 12.6

## 2010-10-15 LAB — GLUCOSE, CAPILLARY: Glucose-Capillary: 105 — ABNORMAL HIGH

## 2010-10-15 LAB — BASIC METABOLIC PANEL
CO2: 30
GFR calc non Af Amer: >60
Glucose, Bld: 96
Potassium: 4
Sodium: 135

## 2010-11-05 ENCOUNTER — Telehealth: Payer: Self-pay | Admitting: *Deleted

## 2010-11-05 NOTE — Telephone Encounter (Signed)
Chart opened in error

## 2010-11-15 ENCOUNTER — Encounter: Payer: Self-pay | Admitting: Family Medicine

## 2010-11-15 ENCOUNTER — Ambulatory Visit (INDEPENDENT_AMBULATORY_CARE_PROVIDER_SITE_OTHER): Payer: Medicare Other | Admitting: Family Medicine

## 2010-11-15 VITALS — BP 142/78 | HR 84 | Temp 97.9°F | Wt 151.5 lb

## 2010-11-15 DIAGNOSIS — F0390 Unspecified dementia without behavioral disturbance: Secondary | ICD-10-CM

## 2010-11-15 DIAGNOSIS — I10 Essential (primary) hypertension: Secondary | ICD-10-CM

## 2010-11-15 DIAGNOSIS — Z23 Encounter for immunization: Secondary | ICD-10-CM

## 2010-11-15 MED ORDER — DONEPEZIL HCL 10 MG PO TABS: 10.0000 mg | ORAL_TABLET | Freq: Every day | ORAL | Status: DC

## 2010-11-15 NOTE — Assessment & Plan Note (Signed)
Mild at this point but becoming more problematic in terms of taking medicines / remembering appts  Long disc with pt and her nephew about safety- as pt does not want to leave her home We disc game plan for putting out meds in pill box- checking frequently and at least once daily calls Disc fact that in future as this progresses she will likely need to be in assisted living  She and nephew voiced understanding Disc safety in detail  Did decide to inc aricept to 10 mg qhs  Adv to update if any side eff or problems F/u 3 mo >25 min spent with face to face with patient, >50% counseling and/or coordinating care

## 2010-11-15 NOTE — Assessment & Plan Note (Signed)
bp is a bit higher than usual - due to noncompliance with medicines Disc plan to have better compliance Disc goals for bp and reasons for that  F/u 3 mo

## 2010-11-15 NOTE — Patient Instructions (Addendum)
Please have [name] fill your pill box with your regular medicines every week Put it in an area that you see frequently  Get one that has am and pm on it Keep your eye drops next to the box Call your eye doctor for drops to be refilled if needed  Also ask him to call you every day to remind you to check to box see that you have taken medicines , also to help keep track of doctor's appointments  We are increasing your aricept to 10 mg daily - this is for memory Keep your brain busy- with reading/ crossword or other puzzles/ socialization  Stay as active as you can  Flu shot today Follow up with me in about 3 months

## 2010-11-15 NOTE — Progress Notes (Signed)
Subjective:    Patient ID: Courtney Castillo, female    DOB: [birth year], 77 y.o.   MRN: [id number]  HPI  Pt is here for f/u of memory problem and also does not feel well in general  Is "out of it "   Labs ok Did reasonably well on MMS exam- but is now reliably forgetting meds and appts  Was supposed to sign rel of consent for [employer]   Started on low dose aricept 5 mg last time She got the med and took it all- no side effects or problems  Also given literature on memory disorders and dementia   Is maybe taking medicines some days and some days not  Short term memory is worse  Has a pill box - does not fill it -- is " lazy"-- perhaps she forgets   Reads the paper every day -  Is trying to keep active   Does not use the stove at all  Does not get lost or wander Not confused at night  Usually knows where she is going  Goes to the same places over and over  [name] (nephew) takes her to Doc appts  Patient Active Problem List  Diagnoses  . DIABETES MELLITUS, TYPE II  . HYPERLIPIDEMIA  . ANEMIA  . CATARACTS  . HYPERTENSION  . ALLERGIC RHINITIS  . REACTIVE AIRWAY DISEASE  . GERD  . DIVERTICULOSIS, COLON  . OSTEOARTHRITIS  . ARTHROPATHY NOS, MULTIPLE SITES  . POLYMYALGIA RHEUMATICA  . DIZZINESS  . Dementia  . Hx Breast cancer ([facility]) Right, Stage one  . HX, PERSONAL, ARTHRITIS  . Chest wall pain  . Breast cancer  . Back pain  . Knee pain   Past Medical History  Diagnosis Date  . Diabetes mellitus     type II  . Diverticulosis of colon   . Hyperlipidemia   . Hypertension   . Arthritis     osteoarthritis knees  . GERD (gastroesophageal reflux disease)     hiatal hernia  . Allergy     allergic rhinitis/ vasometer  . Lumbar spinal stenosis 05/2007  . 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 Anemia   . Cancer     Hx of breast CA & recurrent breast CA 11/11  . Dementia 2012   Past Surgical History  Procedure Date    . Carpal tunnel release   . Eye surgery     cataract extraction  . Abdominal hysterectomy 03/1997    BSO stage 1B adenocarcinoma of endometruim  . Mastectomy w/ nodes partial 12/14/2009    [facility] Right, on  . Incisional breast biopsy 10/16/2009  . Shoulder surgery   . Back surgery 2009  . Tympanoplasty 1996  . Mastectomy partial / lumpectomy 12/28/1998    NL lumpectomy  . Mastectomy partial / lumpectomy 01/07/2010    Re-excision for + margin   History  Substance Use Topics  . Smoking status: Never Smoker   . Smokeless tobacco: Never Used  . Alcohol Use: No   Family History  Problem Relation Age of Onset  . Diabetes Mother   . Heart disease Mother     MI  . Kidney disease Father   . Hypertension Father    Allergies  Allergen Reactions  . Sulfamethoxazole W/Trimethoprim     REACTION: Unknown reaction it has been years since patient took it and she only knows it was a Sulfa drug she took.  Current Outpatient Prescriptions on File Prior to Visit  Medication Sig Dispense Refill  . aspirin 81 MG tablet Take 81 mg by mouth daily. with meal       . fexofenadine (ALLEGRA) 60 MG tablet Take 60 mg by mouth daily as needed.        . hydrochlorothiazide 50 MG tablet Take 1 tablet (50 mg total) by mouth daily.  30 tablet  2  . meclizine (ANTIVERT) 25 MG tablet Take 0.5 tablets (12.5 mg total) by mouth 2 (two) times daily as needed for dizziness.  30 tablet  1  . meloxicam (MOBIC) 15 MG tablet Take 15 mg by mouth daily as needed. with meal       . Multiple Vitamin (MULTI-VITAMIN PO) Take by mouth.        . pantoprazole (PROTONIX) 40 MG tablet Take 40 mg by mouth daily as needed.       . sertraline (ZOLOFT) 25 MG tablet Take 1 tablet (25 mg total) by mouth daily.  30 tablet  11  . solifenacin (VESICARE) 5 MG tablet Take 1 tablet (5 mg total) by mouth daily.  30 tablet  5  . travoprost, benzalkonium, (TRAVATAN) 0.004 % ophthalmic solution as directed.        . valsartan (DIOVAN) 80 MG  tablet Take one and one half tablets by mouth every morning.  45 tablet  6       Review of Systems Review of Systems  Constitutional: Negative for fever, appetite change, fatigue and unexpected weight change.  Eyes: Negative for pain and visual disturbance.  Respiratory: Negative for cough and shortness of breath.   Cardiovascular: Negative for cp or palpitations    Gastrointestinal: Negative for nausea, diarrhea and constipation.  Genitourinary: Negative for urgency and frequency.  Skin: Negative for pallor or rash   Neurological: Negative for weakness, light-headedness, numbness and headaches.  Hematological: Negative for adenopathy. Does not bruise/bleed easily.  Psychiatric/Behavioral: Negative for dysphoric mood. Pos for general anxiety and loss of short term memory           Objective:   Physical Exam  Constitutional: She appears well-developed and well-nourished. No distress.       Pt looks well / good hygiene   HENT:  Head: Normocephalic and atraumatic.  Mouth/Throat: Oropharynx is clear and moist.  Eyes: Conjunctivae and EOM are normal. Pupils are equal, round, and reactive to light. No scleral icterus.  Neck: Normal range of motion. Neck supple. No JVD present. Carotid bruit is not present. No thyromegaly present.  Cardiovascular: Normal rate, regular rhythm and intact distal pulses.  Exam reveals no gallop.   Murmur heard. Pulmonary/Chest: Effort normal and breath sounds normal. No respiratory distress. She has no wheezes.  Abdominal: Soft. Bowel sounds are normal. She exhibits no distension, no abdominal bruit and no mass. There is no tenderness.  Musculoskeletal: She exhibits no edema and no tenderness.  Lymphadenopathy:    She has no cervical adenopathy.  Neurological: She is alert. She has normal reflexes. No cranial nerve deficit. She exhibits normal muscle tone. Coordination normal.       No tremor   Skin: Skin is warm and dry. No rash noted. No erythema. No  pallor.  Psychiatric:       Pt is mildly anxious- but less so than usual Supportive nephew is present who is her power of attourney She occasionally repeats questions          Assessment & Plan:

## 2010-11-19 ENCOUNTER — Other Ambulatory Visit: Payer: Self-pay

## 2010-11-19 NOTE — Telephone Encounter (Signed)
[employer] faxed form for glucose testing supplies. I spoke with pt and she does want diabetic supplies from [facility]. Form is on your shelf.

## 2010-11-22 NOTE — Telephone Encounter (Signed)
I think I did this Friday- should be in IN box or already faxed-thanks

## 2010-11-22 NOTE — Telephone Encounter (Signed)
Completed form faxed [phone] as instructed.

## 2010-11-24 ENCOUNTER — Other Ambulatory Visit: Payer: Self-pay

## 2010-11-24 MED ORDER — HYDROCHLOROTHIAZIDE 50 MG PO TABS: 50.0000 mg | ORAL_TABLET | Freq: Every day | ORAL | Status: DC

## 2010-11-24 NOTE — Telephone Encounter (Signed)
Completed form faxed [phone].

## 2010-11-24 NOTE — Telephone Encounter (Signed)
[facility] faxed new prescription authorization form for HCTZ 50 mg. Form is on your shelf in the in box.

## 2010-11-24 NOTE — Telephone Encounter (Signed)
Form in IN box and noted refil in med list

## 2010-12-16 ENCOUNTER — Ambulatory Visit: Payer: Medicare Other | Admitting: Oncology

## 2010-12-16 ENCOUNTER — Other Ambulatory Visit: Payer: Medicare Other | Admitting: Lab

## 2010-12-20 ENCOUNTER — Inpatient Hospital Stay (HOSPITAL_COMMUNITY)
Admission: EM | Admit: 2010-12-20 | Discharge: 2010-12-28 | DRG: 554 | Disposition: A | Discharge status: Skilled Nursing Facility | Payer: Medicare Other | Attending: Internal Medicine | Admitting: Internal Medicine

## 2010-12-20 ENCOUNTER — Encounter (HOSPITAL_COMMUNITY): Payer: Self-pay | Admitting: Emergency Medicine

## 2010-12-20 ENCOUNTER — Emergency Department (HOSPITAL_COMMUNITY): Payer: Medicare Other

## 2010-12-20 DIAGNOSIS — M129 Arthropathy, unspecified: Secondary | ICD-10-CM

## 2010-12-20 DIAGNOSIS — J45909 Unspecified asthma, uncomplicated: Secondary | ICD-10-CM

## 2010-12-20 DIAGNOSIS — F0390 Unspecified dementia without behavioral disturbance: Secondary | ICD-10-CM

## 2010-12-20 DIAGNOSIS — Z8739 Personal history of other diseases of the musculoskeletal system and connective tissue: Secondary | ICD-10-CM

## 2010-12-20 DIAGNOSIS — M1710 Unilateral primary osteoarthritis, unspecified knee: Principal | ICD-10-CM | POA: Diagnosis present

## 2010-12-20 DIAGNOSIS — M25562 Pain in left knee: Secondary | ICD-10-CM | POA: Diagnosis present

## 2010-12-20 DIAGNOSIS — M549 Dorsalgia, unspecified: Secondary | ICD-10-CM

## 2010-12-20 DIAGNOSIS — N390 Urinary tract infection, site not specified: Secondary | ICD-10-CM

## 2010-12-20 DIAGNOSIS — K219 Gastro-esophageal reflux disease without esophagitis: Secondary | ICD-10-CM

## 2010-12-20 DIAGNOSIS — C50919 Malignant neoplasm of unspecified site of unspecified female breast: Secondary | ICD-10-CM

## 2010-12-20 DIAGNOSIS — E785 Hyperlipidemia, unspecified: Secondary | ICD-10-CM

## 2010-12-20 DIAGNOSIS — D649 Anemia, unspecified: Secondary | ICD-10-CM

## 2010-12-20 DIAGNOSIS — Z853 Personal history of malignant neoplasm of breast: Secondary | ICD-10-CM

## 2010-12-20 DIAGNOSIS — K5730 Diverticulosis of large intestine without perforation or abscess without bleeding: Secondary | ICD-10-CM

## 2010-12-20 DIAGNOSIS — M1990 Unspecified osteoarthritis, unspecified site: Secondary | ICD-10-CM

## 2010-12-20 DIAGNOSIS — R0789 Other chest pain: Secondary | ICD-10-CM

## 2010-12-20 DIAGNOSIS — E119 Type 2 diabetes mellitus without complications: Secondary | ICD-10-CM

## 2010-12-20 DIAGNOSIS — M25561 Pain in right knee: Secondary | ICD-10-CM

## 2010-12-20 DIAGNOSIS — R42 Dizziness and giddiness: Secondary | ICD-10-CM

## 2010-12-20 DIAGNOSIS — M25569 Pain in unspecified knee: Secondary | ICD-10-CM

## 2010-12-20 DIAGNOSIS — I509 Heart failure, unspecified: Secondary | ICD-10-CM | POA: Diagnosis present

## 2010-12-20 DIAGNOSIS — J309 Allergic rhinitis, unspecified: Secondary | ICD-10-CM

## 2010-12-20 DIAGNOSIS — H269 Unspecified cataract: Secondary | ICD-10-CM

## 2010-12-20 DIAGNOSIS — I10 Essential (primary) hypertension: Secondary | ICD-10-CM

## 2010-12-20 DIAGNOSIS — M353 Polymyalgia rheumatica: Secondary | ICD-10-CM

## 2010-12-20 HISTORY — DX: Anxiety disorder, unspecified: F41.9

## 2010-12-20 LAB — BASIC METABOLIC PANEL
BUN: 23 mg/dL (ref 6–23)
Calcium: 9.6 mg/dL (ref 8.4–10.5)
Creatinine, Ser: 0.71 mg/dL (ref 0.50–1.10)
GFR calc Af Amer: >90 mL/min (ref >90)
GFR calc non Af Amer: 79 mL/min — ABNORMAL LOW (ref >90)
Glucose, Bld: 176 mg/dL — ABNORMAL HIGH (ref 70–99)
Potassium: 3.9 mEq/L (ref 3.5–5.1)

## 2010-12-20 LAB — DIFFERENTIAL
Basophils Relative: 0 % (ref 0–1)
Eosinophils Absolute: 0 10*3/uL (ref 0.0–0.7)
Eosinophils Relative: 0 % (ref 0–5)
Monocytes Absolute: 2.3 10*3/uL — ABNORMAL HIGH (ref 0.1–1.0)
Monocytes Relative: 15 % — ABNORMAL HIGH (ref 3–12)
Neutrophils Relative %: 73 % (ref 43–77)

## 2010-12-20 LAB — CBC
Hemoglobin: 11.3 g/dL — ABNORMAL LOW (ref 12.0–15.0)
MCH: 30.7 pg (ref 26.0–34.0)
MCHC: 33.8 g/dL (ref 30.0–36.0)
MCV: 90.8 fL (ref 78.0–100.0)

## 2010-12-20 NOTE — ED Notes (Signed)
Per ems, family came by to see the patient at home on Saturday and then the patient was unable to get up and get any food/water and unable to ambulate since that time due to pain from arthritis.

## 2010-12-21 ENCOUNTER — Emergency Department (HOSPITAL_COMMUNITY): Payer: Medicare Other

## 2010-12-21 ENCOUNTER — Encounter (HOSPITAL_COMMUNITY): Payer: Self-pay | Admitting: Family Medicine

## 2010-12-21 DIAGNOSIS — M25562 Pain in left knee: Secondary | ICD-10-CM | POA: Diagnosis present

## 2010-12-21 DIAGNOSIS — M25561 Pain in right knee: Secondary | ICD-10-CM | POA: Diagnosis present

## 2010-12-21 LAB — CREATININE, SERUM: GFR calc Af Amer: >90 mL/min (ref >90)

## 2010-12-21 LAB — URINALYSIS, ROUTINE W REFLEX MICROSCOPIC
Nitrite: NEGATIVE
Urobilinogen, UA: 1 mg/dL (ref 0.0–1.0)

## 2010-12-21 LAB — CBC
HCT: 32.9 % — ABNORMAL LOW (ref 36.0–46.0)
MCH: 30.7 pg (ref 26.0–34.0)
MCHC: 33.7 g/dL (ref 30.0–36.0)
MCV: 90.9 fL (ref 78.0–100.0)
RDW: 12.8 % (ref 11.5–15.5)

## 2010-12-21 LAB — GLUCOSE, CAPILLARY
Glucose-Capillary: 135 mg/dL — ABNORMAL HIGH (ref 70–99)
Glucose-Capillary: 202 mg/dL — ABNORMAL HIGH (ref 70–99)
Glucose-Capillary: 128 mg/dL — ABNORMAL HIGH (ref 70–99)

## 2010-12-21 LAB — URINE MICROSCOPIC-ADD ON

## 2010-12-21 MED ORDER — MECLIZINE HCL 12.5 MG PO TABS
12.5000 mg | ORAL_TABLET | Freq: Two times a day (BID) | ORAL | Status: DC | PRN
  Administered 2010-12-25: 12.5 mg via ORAL
  Filled 2010-12-21: qty 1

## 2010-12-21 MED ORDER — SERTRALINE HCL 25 MG PO TABS
25.0000 mg | ORAL_TABLET | Freq: Every day | ORAL | Status: DC
  Administered 2010-12-21 – 2010-12-28 (×8): 25 mg via ORAL
  Filled 2010-12-21 (×9): qty 1

## 2010-12-21 MED ORDER — POTASSIUM CHLORIDE IN NACL 20-0.9 MEQ/L-% IV SOLN
INTRAVENOUS | Status: DC
  Administered 2010-12-21 – 2010-12-22 (×3): via INTRAVENOUS
  Filled 2010-12-21 (×4): qty 1000

## 2010-12-21 MED ORDER — NAPROXEN 500 MG PO TABS
500.0000 mg | ORAL_TABLET | Freq: Two times a day (BID) | ORAL | Status: DC
  Administered 2010-12-21 – 2010-12-28 (×15): 500 mg via ORAL
  Filled 2010-12-21 (×17): qty 1

## 2010-12-21 MED ORDER — DONEPEZIL HCL 10 MG PO TABS
10.0000 mg | ORAL_TABLET | Freq: Every day | ORAL | Status: DC
  Administered 2010-12-21 – 2010-12-27 (×7): 10 mg via ORAL
  Filled 2010-12-21 (×10): qty 1

## 2010-12-21 MED ORDER — HYDROCODONE-ACETAMINOPHEN 5-325 MG PO TABS
1.0000 | ORAL_TABLET | ORAL | Status: DC | PRN
Start: 2010-12-21 — End: 2010-12-28
  Administered 2010-12-21 – 2010-12-24 (×4): 2 via ORAL
  Administered 2010-12-27: 1 via ORAL
  Filled 2010-12-21 (×2): qty 1
  Filled 2010-12-21: qty 2
  Filled 2010-12-21 (×2): qty 1
  Filled 2010-12-21: qty 2

## 2010-12-21 MED ORDER — ONDANSETRON HCL 4 MG/2ML IJ SOLN
4.0000 mg | Freq: Four times a day (QID) | INTRAMUSCULAR | Status: DC | PRN
  Administered 2010-12-22: 4 mg via INTRAVENOUS
  Filled 2010-12-21 (×2): qty 2

## 2010-12-21 MED ORDER — OLMESARTAN MEDOXOMIL 40 MG PO TABS
40.0000 mg | ORAL_TABLET | Freq: Every day | ORAL | Status: DC
  Administered 2010-12-21 – 2010-12-28 (×8): 40 mg via ORAL
  Filled 2010-12-21 (×9): qty 1

## 2010-12-21 MED ORDER — ALUM & MAG HYDROXIDE-SIMETH 200-200-20 MG/5ML PO SUSP: 30.0000 mL | Freq: Four times a day (QID) | ORAL | Status: DC | PRN

## 2010-12-21 MED ORDER — SODIUM CHLORIDE 0.9 % IV SOLN: INTRAVENOUS | Status: DC

## 2010-12-21 MED ORDER — ACETAMINOPHEN 650 MG RE SUPP: 650.0000 mg | Freq: Four times a day (QID) | RECTAL | Status: DC | PRN

## 2010-12-21 MED ORDER — INSULIN ASPART 100 UNIT/ML ~~LOC~~ SOLN: 0.0000 [IU] | Freq: Every day | SUBCUTANEOUS | Status: DC

## 2010-12-21 MED ORDER — MORPHINE SULFATE 2 MG/ML IJ SOLN: 1.0000 mg | INTRAMUSCULAR | Status: DC | PRN

## 2010-12-21 MED ORDER — ASPIRIN 81 MG PO CHEW
81.0000 mg | CHEWABLE_TABLET | Freq: Every day | ORAL | Status: DC
  Administered 2010-12-21 – 2010-12-28 (×8): 81 mg via ORAL
  Filled 2010-12-21 (×10): qty 1

## 2010-12-21 MED ORDER — PANTOPRAZOLE SODIUM 40 MG PO TBEC
40.0000 mg | DELAYED_RELEASE_TABLET | Freq: Every day | ORAL | Status: DC
  Administered 2010-12-21 – 2010-12-28 (×8): 40 mg via ORAL
  Filled 2010-12-21 (×9): qty 1

## 2010-12-21 MED ORDER — TRAVOPROST (BAK FREE) 0.004 % OP SOLN
1.0000 [drp] | OPHTHALMIC | Status: DC
  Filled 2010-12-21 (×2): qty 2.5

## 2010-12-21 MED ORDER — ENOXAPARIN SODIUM 40 MG/0.4ML ~~LOC~~ SOLN
40.0000 mg | SUBCUTANEOUS | Status: DC
  Administered 2010-12-21 – 2010-12-28 (×8): 40 mg via SUBCUTANEOUS
  Filled 2010-12-21 (×9): qty 0.4

## 2010-12-21 MED ORDER — ACETAMINOPHEN 325 MG PO TABS
650.0000 mg | ORAL_TABLET | Freq: Four times a day (QID) | ORAL | Status: DC | PRN
  Filled 2010-12-21: qty 2

## 2010-12-21 MED ORDER — MOXIFLOXACIN HCL IN NACL 400 MG/250ML IV SOLN
400.0000 mg | INTRAVENOUS | Status: DC
  Administered 2010-12-21 – 2010-12-22 (×2): 400 mg via INTRAVENOUS
  Filled 2010-12-21 (×2): qty 250

## 2010-12-21 MED ORDER — ONDANSETRON HCL 4 MG PO TABS: 4.0000 mg | ORAL_TABLET | Freq: Four times a day (QID) | ORAL | Status: DC | PRN

## 2010-12-21 MED ORDER — INSULIN ASPART 100 UNIT/ML ~~LOC~~ SOLN
0.0000 [IU] | Freq: Three times a day (TID) | SUBCUTANEOUS | Status: DC
  Administered 2010-12-22: 5 [IU] via SUBCUTANEOUS
  Administered 2010-12-26: 2 [IU] via SUBCUTANEOUS
  Filled 2010-12-21 (×2): qty 3

## 2010-12-21 NOTE — Discharge Planning (Signed)
ED CM noted CM consult from Admission RN. Completed referral to unit CM

## 2010-12-21 NOTE — ED Notes (Signed)
Pt attempted to get oob with assistance to use the bathroom, pt is very weak and could not stand on her own, used the bedpan instead to void

## 2010-12-21 NOTE — ED Notes (Signed)
Report called to [place] on 3E. Pt going to rm 1302.

## 2010-12-21 NOTE — H&P (Addendum)
PCP:   Roxy Manns, MD, MD   Chief Complaint:  Bilateral knee pain  HPI: This is a 77 year old female lives alone, who amylase with a walker and a cane, she states yesterday she developed pain in both knees, today she is unable to ambulate due to the pain in both knees. She was alone at home on the couch unable to get up for approximately 24 hours per ER physician. I tried to narrow the timeline with the patient she states she was unable to get up for approximately 6 hours. When family members were unable to contact her by phone the friend made a trip over. The patient does give a history of arthritis of the knees for which she used to get shots, I'm unclear if the steroids or Synvisc. She has not had shots in approximately a year. She ambulates using both a cane and a walker. She states both her knees are swollen.   Review of Systems: Positives bolded The patient denies anorexia, fever, weight loss,, vision loss, decreased hearing, hoarseness, chest pain, syncope, dyspnea on exertion, peripheral edema, balance deficits, hemoptysis, abdominal pain, melena, hematochezia, severe indigestion/heartburn, hematuria, incontinence, genital sores, muscle weakness, suspicious skin lesions, transient blindness, difficulty walking, depression, unusual weight change, abnormal bleeding, enlarged lymph nodes, angioedema, and breast masses.  Past Medical History: Past Medical History  Diagnosis Date  . Diabetes mellitus     type II  . Diverticulosis of colon   . Hyperlipidemia   . Hypertension   . Arthritis     osteoarthritis knees  . GERD (gastroesophageal reflux disease)     hiatal hernia  . Allergy     allergic rhinitis/ vasometer  . Lumbar spinal stenosis 05/2007  . 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 Anemia   . Cancer     Hx of breast CA & recurrent breast CA 11/11  . Dementia 2012   Past Surgical History  Procedure Date  . Carpal tunnel release     . Eye surgery     cataract extraction  . Abdominal hysterectomy 03/1997    BSO stage 1B adenocarcinoma of endometruim  . Mastectomy w/ nodes partial 12/14/2009    IDC Right, on  . Incisional breast biopsy 10/16/2009  . Shoulder surgery   . Back surgery 2009  . Tympanoplasty 1996  . Mastectomy partial / lumpectomy 12/28/1998    NL lumpectomy  . Mastectomy partial / lumpectomy 01/07/2010    Re-excision for + margin    Medications: Prior to Admission medications   Medication Sig Start Date End Date Taking? Authorizing Provider  aspirin 81 MG tablet Take 81 mg by mouth daily. with meal    Yes Historical Provider, MD  donepezil (ARICEPT) 10 MG tablet Take 1 tablet (10 mg total) by mouth at bedtime. 11/15/10 11/15/11 Yes Roxy Manns, MD  fexofenadine (ALLEGRA) 60 MG tablet Take 60 mg by mouth daily as needed.     Yes Historical Provider, MD  hydrochlorothiazide (HYDRODIURIL) 50 MG tablet Take 1 tablet (50 mg total) by mouth daily. 11/24/10 11/24/11 Yes Roxy Manns, MD  meclizine (ANTIVERT) 25 MG tablet Take 0.5 tablets (12.5 mg total) by mouth 2 (two) times daily as needed for dizziness. 06/18/10  Yes Roxy Manns, MD  meloxicam (MOBIC) 15 MG tablet Take 15 mg by mouth daily as needed. with meal    Yes Historical Provider, MD  Multiple Vitamin (MULTI-VITAMIN PO) Take by mouth.  Yes Historical Provider, MD  pantoprazole (PROTONIX) 40 MG tablet Take 40 mg by mouth daily as needed. indigestion   Yes Historical Provider, MD  sertraline (ZOLOFT) 25 MG tablet Take 1 tablet (25 mg total) by mouth daily. 06/21/10 06/21/11 Yes Roxy Manns, MD  solifenacin (VESICARE) 5 MG tablet Take 1 tablet (5 mg total) by mouth daily. 07/17/10  Yes Roxy Manns, MD  travoprost, benzalkonium, (TRAVATAN) 0.004 % ophthalmic solution as directed.     Yes Historical Provider, MD  valsartan (DIOVAN) 80 MG tablet Take one and one half tablets by mouth every morning. 04/07/10 04/07/11 Yes Roxy Manns, MD    Allergies:    Allergies  Allergen Reactions  . Sulfamethoxazole W/Trimethoprim     REACTION: Unknown reaction it has been years since patient took it and she only knows it was a Sulfa drug she took.    Social History:  reports that she has never smoked. She has never used smokeless tobacco. She reports that she does not drink alcohol or use illicit drugs.  Family History: Family History  Problem Relation Age of Onset  . Diabetes Mother   . Heart disease Mother     MI  . Kidney disease Father   . Hypertension Father     Physical Exam: Filed Vitals:   12/20/10 2104 12/21/10 0121 12/21/10 0349  BP: 161/72 147/67 136/64  Pulse: 112 105 111  Temp: 98 F (36.7 C) 98.2 F (36.8 C) 98 F (36.7 C)  TempSrc: Oral Oral Oral  Resp: 20 18 16   SpO2: 99% 97% 97%    General:  Alert and oriented times three, well developed and nourished, no acute distress Eyes: PERRLA, pink conjunctiva, no scleral icterus ENT: Very dry oral mucosa with crustacean, neck supple, no thyromegaly Lungs: clear to ascultation, no wheeze, no crackles, no use of accessory muscles Cardiovascular: regular rate and rhythm, no regurgitation, no gallops, no murmurs. No carotid bruits, no JVD Abdomen: soft, positive BS, non-tender, non-distended, no organomegaly, not an acute abdomen GU: not examined Neuro: CN II - XII grossly intact, sensation intact Musculoskeletal: strength 5/5 all extremities, no clubbing, cyanosis or edema, both knees tender to palpation anteriorly, no significant effusion or erythema appreciated. Good range of motion Skin: no rash, no subcutaneous crepitation, no decubitus Psych: appropriate patient   Labs on Admission:   Basename 12/20/10 2320  NA 137  K 3.9  CL 103  CO2 25  GLUCOSE 176*  BUN 23  CREATININE 0.71  CALCIUM 9.6  MG --  PHOS --   No results found for this basename: AST:2,ALT:2,ALKPHOS:2,BILITOT:2,PROT:2,ALBUMIN:2 in the last 72 hours No results found for this basename:  LIPASE:2,AMYLASE:2 in the last 72 hours  Basename 12/20/10 2320  WBC 15.0*  NEUTROABS 11.0*  HGB 11.3*  HCT 33.4*  MCV 90.8  PLT 230   No results found for this basename: CKTOTAL:3,CKMB:3,CKMBINDEX:3,TROPONINI:3 in the last 72 hours No results found for this basename: TSH,T4TOTAL,FREET3,T3FREE,THYROIDAB in the last 72 hours No results found for this basename: VITAMINB12:2,FOLATE:2,FERRITIN:2,TIBC:2,IRON:2,RETICCTPCT:2 in the last 72 hours  Radiological Exams on Admission: Dg Knee 2 Views Left  12/20/2010  *RADIOLOGY REPORT*  Clinical Data: Left knee pain.  LEFT KNEE - 1-2 VIEW  Comparison: Left knee radiographs performed 06/01/2004  Findings: There is no evidence of fracture or dislocation.  The joint spaces are preserved.  Small marginal osteophytes are noted arising at all three compartments, and at the tibial spine.  Mild sclerotic change is noted at the lateral femoral condyle.  A  small joint effusion is seen.  The visualized soft tissues are otherwise unremarkable in appearance.  IMPRESSION:  1.  No evidence of fracture or dislocation. 2.  Mild osteoarthritis at the left knee. 3.  Small joint effusion seen.  Original Report Authenticated By: Tonia Ghent, M.D.   Dg Knee 2 Views Right  12/20/2010  *RADIOLOGY REPORT*  Clinical Data: Right knee pain.  RIGHT KNEE - 1-2 VIEW  Comparison: Right knee radiographs performed 06/01/2004  Findings: There is no evidence of fracture or dislocation.  The joint spaces are preserved.  Small marginal osteophytes are noted arising at all three compartments, and at the tibial spine; these are slightly worsened from 2006.  A small osseous body posterior to the joint space was not present on the prior study and may reflect a small loose body.  A small joint effusion is seen.  The visualized soft tissues are otherwise unremarkable in appearance.  IMPRESSION:  1.  No evidence of fracture or dislocation. 2.  Mildly worsened mild osteoarthritis at the right knee. 3.   Small joint effusion seen. 4.  Question of small loose body.  Original Report Authenticated By: Tonia Ghent, M.D.    Assessment/Plan Present on Admission:  .Pain in both knees Likely due to arthritis, patient with difficulty ambulating Patient lives alone  Naprosyn ordered scheduled and when necessary pain medications  Physical therapy consulted Up with assistance  Clinical dehydration  IV fluids ordered  .DIABETES MELLITUS, TYPE II .HYPERLIPIDEMIA .HYPERTENSION Resume home medications  ADA diet and sliding scale insulin Mild leukocytosis UA negative, chest x-ray ordered No evidence of infection    DVT prophylaxis Team 5/Dr. Donna Bernard  Time in 3:15AM  Time out: 3:47AM   Courtney Castillo 12/21/2010, 3:53 AM

## 2010-12-21 NOTE — ED Notes (Signed)
MD at bedside. Dr Donna Bernard

## 2010-12-21 NOTE — ED Provider Notes (Signed)
History     CSN: [id number] Arrival date & time: 12/20/2010  9:08 PM   First MD Initiated Contact with Patient 12/20/10 2220      Chief Complaint  Patient presents with  . Dehydration  . Pain    (Consider location/radiation/quality/duration/timing/severity/associated sxs/prior treatment) HPI Comments: Patient here with son who reports that she normally walks with a walker and is able to move about with pain related to chronic osteoarthritis.  He states that they did not call her yesterday but today when they called she did not answer the phone and they went by the house to check on her.  He reports that she was on the couch with her walker and was unable to stand up because of the pain.  She reports that she had been there since yesterday evening and was unable to stand.  She had urinated on the couch.  Denies fever or chills, reports chronic swelling in the joints.  Patient is a 77 y.o. female presenting with extremity pain. The history is provided by the patient and a relative. No language interpreter was used.  Extremity Pain This is a recurrent problem. The current episode started yesterday. The problem occurs constantly. The problem has been rapidly worsening. Associated symptoms include arthralgias, fatigue, joint swelling and weakness. Pertinent negatives include no anorexia, chest pain, chills, coughing, diaphoresis, headaches, myalgias, neck pain, numbness, rash, swollen glands or vomiting. The symptoms are aggravated by standing and walking. She has tried nothing for the symptoms. The treatment provided no relief.  Extremity Pain This is a recurrent problem. The current episode started yesterday. The problem occurs constantly. The problem has been rapidly worsening. Pertinent negatives include no chest pain and no headaches. The symptoms are aggravated by standing and walking. She has tried nothing for the symptoms. The treatment provided no relief.    Past Medical History    Diagnosis Date  . Diabetes mellitus     type II  . Diverticulosis of colon   . Hyperlipidemia   . Hypertension   . Arthritis     osteoarthritis knees  . GERD (gastroesophageal reflux disease)     hiatal hernia  . Allergy     allergic rhinitis/ vasometer  . Lumbar spinal stenosis 05/2007  . 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 Anemia   . Cancer     Hx of breast CA & recurrent breast CA 11/11  . Dementia 2012    Past Surgical History  Procedure Date  . Carpal tunnel release   . Eye surgery     cataract extraction  . Abdominal hysterectomy 03/1997    BSO stage 1B adenocarcinoma of endometruim  . Mastectomy w/ nodes partial 12/14/2009    IDC Right, on  . Incisional breast biopsy 10/16/2009  . Shoulder surgery   . Back surgery 2009  . Tympanoplasty 1996  . Mastectomy partial / lumpectomy 12/28/1998    NL lumpectomy  . Mastectomy partial / lumpectomy 01/07/2010    Re-excision for + margin    Family History  Problem Relation Age of Onset  . Diabetes Mother   . Heart disease Mother     MI  . Kidney disease Father   . Hypertension Father     History  Substance Use Topics  . Smoking status: Never Smoker   . Smokeless tobacco: Never Used  . Alcohol Use: No    OB History    Grav Para Term Preterm  Abortions TAB SAB Ect Mult Living                  Review of Systems  Constitutional: Positive for fatigue. Negative for chills and diaphoresis.  HENT: Negative for neck pain.   Respiratory: Negative for cough.   Cardiovascular: Negative for chest pain.  Gastrointestinal: Negative for vomiting and anorexia.  Musculoskeletal: Positive for joint swelling and arthralgias. Negative for myalgias.  Skin: Negative for rash.  Neurological: Positive for weakness. Negative for numbness and headaches.  All other systems reviewed and are negative.    Allergies  Sulfamethoxazole w/trimethoprim  Home Medications   Current  Outpatient Rx  Name Route Sig Dispense Refill  . ASPIRIN 81 MG PO TABS Oral Take 81 mg by mouth daily. with meal     . DONEPEZIL HCL 10 MG PO TABS Oral Take 1 tablet (10 mg total) by mouth at bedtime. 30 tablet 11  . FEXOFENADINE HCL 60 MG PO TABS Oral Take 60 mg by mouth daily as needed.      Marland Kitchen HYDROCHLOROTHIAZIDE 50 MG PO TABS Oral Take 1 tablet (50 mg total) by mouth daily. 90 tablet 3  . MECLIZINE HCL 25 MG PO TABS Oral Take 0.5 tablets (12.5 mg total) by mouth 2 (two) times daily as needed for dizziness. 30 tablet 1  . MELOXICAM 15 MG PO TABS Oral Take 15 mg by mouth daily as needed. with meal     . MULTI-VITAMIN PO Oral Take by mouth.      Marland Kitchen PANTOPRAZOLE SODIUM 40 MG PO TBEC Oral Take 40 mg by mouth daily as needed. indigestion    . SERTRALINE HCL 25 MG PO TABS Oral Take 1 tablet (25 mg total) by mouth daily. 30 tablet 11  . SOLIFENACIN SUCCINATE 5 MG PO TABS Oral Take 1 tablet (5 mg total) by mouth daily. 30 tablet 5  . TRAVOPROST 0.004 % OP SOLN  as directed.      Marland Kitchen VALSARTAN 80 MG PO TABS  Take one and one half tablets by mouth every morning. 45 tablet 6    BP 147/67  Pulse 105  Temp(Src) 98.2 F (36.8 C) (Oral)  Resp 18  SpO2 97%  LMP 01/18/2000  Physical Exam  Nursing note and vitals reviewed. Constitutional: She appears well-developed and well-nourished. She appears distressed.  HENT:  Head: Normocephalic and atraumatic.  Right Ear: External ear normal.  Left Ear: External ear normal.  Mouth/Throat: Mucous membranes are dry.  Eyes: Conjunctivae are normal. Pupils are equal, round, and reactive to light. No scleral icterus.  Neck: Normal range of motion. Neck supple.  Cardiovascular: Regular rhythm and normal heart sounds.  Tachycardia present.  Exam reveals no gallop and no friction rub.   No murmur heard. Pulmonary/Chest: Effort normal and breath sounds normal. No respiratory distress. She has no rales. She exhibits no tenderness.  Abdominal: Soft. Bowel sounds are  normal. There is no tenderness.  Musculoskeletal:       Right knee: She exhibits decreased range of motion and swelling. She exhibits no effusion and no erythema. tenderness found.       Left knee: She exhibits decreased range of motion and swelling. She exhibits no effusion and no erythema. tenderness found.  Lymphadenopathy:    She has no cervical adenopathy.    ED Course  Procedures (including critical care time)  Labs Reviewed  CBC - Abnormal; Notable for the following:    WBC 15.0 (*)    RBC 3.68 (*)  Hemoglobin 11.3 (*)    HCT 33.4 (*)    All other components within normal limits  DIFFERENTIAL - Abnormal; Notable for the following:    Neutro Abs 11.0 (*)    Monocytes Relative 15 (*)    Monocytes Absolute 2.3 (*)    All other components within normal limits  BASIC METABOLIC PANEL - Abnormal; Notable for the following:    Glucose, Bld 176 (*)    GFR calc non Af Amer 79 (*)    All other components within normal limits  URINALYSIS, ROUTINE W REFLEX MICROSCOPIC   Dg Knee 2 Views Left  12/20/2010  *RADIOLOGY REPORT*  Clinical Data: Left knee pain.  LEFT KNEE - 1-2 VIEW  Comparison: Left knee radiographs performed 06/01/2004  Findings: There is no evidence of fracture or dislocation.  The joint spaces are preserved.  Small marginal osteophytes are noted arising at all three compartments, and at the tibial spine.  Mild sclerotic change is noted at the lateral femoral condyle.  A small joint effusion is seen.  The visualized soft tissues are otherwise unremarkable in appearance.  IMPRESSION:  1.  No evidence of fracture or dislocation. 2.  Mild osteoarthritis at the left knee. 3.  Small joint effusion seen.  Original Report Authenticated By: Tonia Ghent, M.D.   Dg Knee 2 Views Right  12/20/2010  *RADIOLOGY REPORT*  Clinical Data: Right knee pain.  RIGHT KNEE - 1-2 VIEW  Comparison: Right knee radiographs performed 06/01/2004  Findings: There is no evidence of fracture or  dislocation.  The joint spaces are preserved.  Small marginal osteophytes are noted arising at all three compartments, and at the tibial spine; these are slightly worsened from 2006.  A small osseous body posterior to the joint space was not present on the prior study and may reflect a small loose body.  A small joint effusion is seen.  The visualized soft tissues are otherwise unremarkable in appearance.  IMPRESSION:  1.  No evidence of fracture or dislocation. 2.  Mildly worsened mild osteoarthritis at the right knee. 3.  Small joint effusion seen. 4.  Question of small loose body.  Original Report Authenticated By: Tonia Ghent, M.D.     Osteoarthritis dehydration   MDM  Patient with history of osteoarthrits presents with acute worsening of symptoms with the inability to walk due to the pain.  Will plan to admit because she cannot return home at this time.      Spoke with Dr. Haroldine Laws who will admit to the hospital  Scarlette Calico C. [place], [place] 12/21/10 0236  Medical screening examination/treatment/procedure(s) were conducted as a shared visit with non-physician practitioner(s) and myself.  I personally evaluated the patient during the encounter. Generalized weakness unable to walk also has significant osteoarthritis. Lungs clear to auscultation and heart is regular rate and rhythm. Patient given IV fluids and by mouth fluids and a meal. Last meal was over 48 hours ago. Family at the bedside plan admission as above.   Sunnie Nielsen, MD 12/21/10 [phone]

## 2010-12-21 NOTE — ED Notes (Signed)
repoprt given to tcu nurse, pt moved to tcu bed 29.

## 2010-12-21 NOTE — Progress Notes (Signed)
I have seen interviewed and examined the patient are, and she is still weak and unable to stand. X-rays of knee with no fracture, mild osteoarthritis with question of small loose body in right knee and chest x-ray with mildly increased interstitial markings are greater on left, given her leukocytosis will start on empiric antibiotics for pneumonia. Her urinalysis is unremarkable. Will continue other management and plan as per Dr. Joneen Roach, PT OT consulted, follow consider ortho consult if not improving with current management.

## 2010-12-22 LAB — GLUCOSE, CAPILLARY: Glucose-Capillary: 203 mg/dL — ABNORMAL HIGH (ref 70–99)

## 2010-12-22 LAB — URINE CULTURE
Colony Count: NO GROWTH
Culture: NO GROWTH
Special Requests: NORMAL

## 2010-12-22 LAB — CBC
MCH: 30.1 pg (ref 26.0–34.0)
MCHC: 33.3 g/dL (ref 30.0–36.0)
MCHC: 33.2 g/dL (ref 30.0–36.0)
Platelets: 199 10*3/uL (ref 150–400)
RDW: 12.6 % (ref 11.5–15.5)
RDW: 12.5 % (ref 11.5–15.5)

## 2010-12-22 LAB — BASIC METABOLIC PANEL
BUN: 25 mg/dL — ABNORMAL HIGH (ref 6–23)
Calcium: 8.6 mg/dL (ref 8.4–10.5)
Creatinine, Ser: 0.74 mg/dL (ref 0.50–1.10)
GFR calc Af Amer: >90 mL/min (ref >90)
GFR calc non Af Amer: 78 mL/min — ABNORMAL LOW (ref >90)
Potassium: 3.9 mEq/L (ref 3.5–5.1)

## 2010-12-22 LAB — PRO B NATRIURETIC PEPTIDE: Pro B Natriuretic peptide (BNP): 3577 pg/mL — ABNORMAL HIGH (ref 0–450)

## 2010-12-22 MED ORDER — LEVOFLOXACIN 500 MG PO TABS
500.0000 mg | ORAL_TABLET | Freq: Every day | ORAL | Status: DC
  Administered 2010-12-23 – 2010-12-27 (×6): 500 mg via ORAL
  Filled 2010-12-22 (×8): qty 1

## 2010-12-22 MED ORDER — SODIUM CHLORIDE 0.9 % IJ SOLN
10.0000 mL | Freq: Two times a day (BID) | INTRAMUSCULAR | Status: DC
  Administered 2010-12-23 – 2010-12-25 (×2): 10 mL via INTRAVENOUS

## 2010-12-22 NOTE — Progress Notes (Signed)
Inpatient Diabetes Program Recommendations  AACE/ADA: New Consensus Statement on Inpatient Glycemic Control (2009)  Target Ranges:  Prepandial:   less than 140 mg/dL      Peak postprandial:   less than 180 mg/dL (1-2 hours)      Critically ill patients:  140 - 180 mg/dL   Reason for Visit: Hyperglycemia  Inpatient Diabetes Program Recommendations HgbA1C: Check HgbA1C to assess glycemic control  Note:

## 2010-12-22 NOTE — Progress Notes (Signed)
Subjective: Still having bilateral knee pains, Left worse than right.  Objective: Vital signs in last 24 hours: Temp:  [97.7 F (36.5 C)-99 F (37.2 C)] 98.1 F (36.7 C) (12/05 1321) Pulse Rate:  [68-105] 82  (12/05 1321) Resp:  [16-18] 16  (12/05 1321) BP: (108-166)/(54-73) 133/71 mmHg (12/05 1321) SpO2:  [95 %-98 %] 95 % (12/05 1321) Weight:  [66.134 kg (145 lb 12.8 oz)] 145 lb 12.8 oz (66.134 kg) (12/04 2100) Weight change:  Last BM Date: 12/22/10  Intake/Output from previous day: 12/04 0701 - 12/05 0700 In: -  Out: 350 [Urine:350]     Physical Exam:  General: Comfortable, alert, communicative, fully oriented, not short of breath at rest.  HEENT:  Mild clinical pallor, no jaundice, no conjunctival injection or discharge. Hydration seems fair. NECK:  Supple, JVP not seen, no carotid bruits, no palpable lymphadenopathy, no palpable goiter. CHEST:  Bibasal fine crackles, no wheeze. HEART:  Sounds 1 and 2 heard, normal, regular, no murmurs. ABDOMEN:  Full, soft, non-tender, no palpable organomegaly, no palpable masses, normal bowel sounds. GENITALIA:  Not examined. LOWER EXTREMITIES:  No pitting edema, palpable peripheral pulses. MUSCULOSKELETAL SYSTEM:  Generalized osteoarthritic changes, no knee effusion, has full ROM, but does experience pain on knee flexion bilaterally. CENTRAL NERVOUS SYSTEM:  No focal neurologic deficit on gross examination.  Lab Results:  [facility] 12/22/10 0319 12/22/10 0115  WBC 14.6* 14.4*  HGB 9.8* 10.1*  HCT 29.5* 30.3*  PLT 210 199    Basename 12/22/10 0115 12/21/10 0639 12/20/10 2320  NA 132* -- 137  K 3.9 -- 3.9  CL 101 -- 103  CO2 24 -- 25  GLUCOSE 164* -- 176*  BUN 25* -- 23  CREATININE 0.74 0.73 --  CALCIUM 8.6 -- 9.6   Recent Results (from the past 240 hour(s))  URINE CULTURE     Status: Normal   Collection Time   12/21/10  1:20 AM      Component Value Range Status Comment   Specimen Description URINE, CLEAN CATCH   Final      Special Requests Normal   Final    Setup Time [id number]   Final    Colony Count NO GROWTH   Final    Culture NO GROWTH   Final    Report Status 12/22/2010 FINAL   Final      Studies/Results: Dg Knee 2 Views Left  12/20/2010  *RADIOLOGY REPORT*  Clinical Data: Left knee pain.  LEFT KNEE - 1-2 VIEW  Comparison: Left knee radiographs performed 06/01/2004  Findings: There is no evidence of fracture or dislocation.  The joint spaces are preserved.  Small marginal osteophytes are noted arising at all three compartments, and at the tibial spine.  Mild sclerotic change is noted at the lateral femoral condyle.  A small joint effusion is seen.  The visualized soft tissues are otherwise unremarkable in appearance.  IMPRESSION:  1.  No evidence of fracture or dislocation. 2.  Mild osteoarthritis at the left knee. 3.  Small joint effusion seen.  Original Report Authenticated By: Tonia Ghent, M.D.   Dg Knee 2 Views Right  12/20/2010  *RADIOLOGY REPORT*  Clinical Data: Right knee pain.  RIGHT KNEE - 1-2 VIEW  Comparison: Right knee radiographs performed 06/01/2004  Findings: There is no evidence of fracture or dislocation.  The joint spaces are preserved.  Small marginal osteophytes are noted arising at all three compartments, and at the tibial spine; these are slightly worsened from 2006.  A small  osseous body posterior to the joint space was not present on the prior study and may reflect a small loose body.  A small joint effusion is seen.  The visualized soft tissues are otherwise unremarkable in appearance.  IMPRESSION:  1.  No evidence of fracture or dislocation. 2.  Mildly worsened mild osteoarthritis at the right knee. 3.  Small joint effusion seen. 4.  Question of small loose body.  Original Report Authenticated By: Tonia Ghent, M.D.   Dg Chest Port 1 View  12/21/2010  *RADIOLOGY REPORT*  Clinical Data: Shortness of breath and leukocytosis.  PORTABLE CHEST - 1 VIEW  Comparison: Chest radiograph  performed 05/12/2010  Findings: The lungs are well-aerated.  Vascular congestion is noted, with mildly increased interstitial markings, more prominent on the left, raising concern for minimal interstitial edema. Pneumonia could conceivably have a similar appearance.  There is no evidence of pleural effusion or pneumothorax.  The cardiomediastinal silhouette is borderline enlarged.  No acute osseous abnormalities are seen.  Clips are noted overlying the right axilla.  IMPRESSION: Vascular congestion and borderline cardiomegaly, with mildly increased interstitial markings, more prominent on the left, raising concern for minimal interstitial edema.  Pneumonia could conceivably have a similar appearance.  Original Report Authenticated By: Tonia Ghent, M.D.    Medications: Scheduled Meds:   . aspirin  81 mg Oral Daily  . donepezil  10 mg Oral QHS  . enoxaparin  40 mg Subcutaneous Q24H  . insulin aspart  0-15 Units Subcutaneous TID WC  . insulin aspart  0-5 Units Subcutaneous QHS  . moxifloxacin  400 mg Intravenous Q24H  . naproxen  500 mg Oral BID WC  . olmesartan  40 mg Oral Daily  . pantoprazole  40 mg Oral Daily  . sertraline  25 mg Oral Daily  . Travoprost (BAK Free)  1 drop Both Eyes UD   Continuous Infusions:   . 0.9 % NaCl with KCl 20 mEq / L 75 mL/hr at 12/22/10 0744  . DISCONTD: sodium chloride Stopped (12/21/10 1254)   PRN Meds:.acetaminophen, acetaminophen, alum & mag hydroxide-simeth, HYDROcodone-acetaminophen, meclizine, morphine, ondansetron (ZOFRAN) IV, ondansetron  Assessment/Plan:  Principal Problem:  *Pain in both knees: Secondary to osteoarthritis. Will schedule analgesics, and request PT/OT. Patient's primary Ortho is Dr Jillyn Hidden. Active Problems: 1.  DIABETES MELLITUS, TYPE II: Controlled. Continue current management.  2. HYPERLIPIDEMIA: Check lipid profile and TSH  3. HYPERTENSION: controlled.  4. Hx Breast cancer (IDC) Right, Stage one: No Issues.  5. Pyuria. Will  treat for possible early UTI, with Levaquin.  6. Patient apprears to have mild fluid overload vs CHF. DC ivi fluids, check BNP and do 2D Echo.   LOS: 2 days   Grae Leathers,CHRISTOPHER 12/22/2010, 8:04 PM

## 2010-12-22 NOTE — Progress Notes (Signed)
Physical Therapy Evaluation Patient Details Name: Courtney Castillo MRN: [id number] DOB: 1927-05-09 Today's Date: 12/22/2010 12:07-12:29 EVII  Recommend SNF at D/C  Problem List:  Patient Active Problem List  Diagnoses  . DIABETES MELLITUS, TYPE II  . HYPERLIPIDEMIA  . ANEMIA  . CATARACTS  . HYPERTENSION  . ALLERGIC RHINITIS  . REACTIVE AIRWAY DISEASE  . GERD  . DIVERTICULOSIS, COLON  . OSTEOARTHRITIS  . ARTHROPATHY NOS, MULTIPLE SITES  . POLYMYALGIA RHEUMATICA  . DIZZINESS  . Dementia  . Hx Breast cancer (IDC) Right, Stage one  . HX, PERSONAL, ARTHRITIS  . Chest wall pain  . Breast cancer  . Back pain  . Knee pain  . Pain in both knees    Past Medical History:  Past Medical History  Diagnosis Date  . Diabetes mellitus     type II  . Diverticulosis of colon   . Hyperlipidemia   . Hypertension   . Arthritis     osteoarthritis knees  . GERD (gastroesophageal reflux disease)     hiatal hernia  . Allergy     allergic rhinitis/ vasometer  . Lumbar spinal stenosis 05/2007  . 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 Anemia   . Cancer     Hx of breast CA & recurrent breast CA 11/11  . Dementia 2012  . Anxiety    Past Surgical History:  Past Surgical History  Procedure Date  . Carpal tunnel release   . Eye surgery     cataract extraction  . Abdominal hysterectomy 03/1997    BSO stage 1B adenocarcinoma of endometruim  . Mastectomy w/ nodes partial 12/14/2009    IDC Right, on  . Incisional breast biopsy 10/16/2009  . Shoulder surgery   . Back surgery 2009  . Tympanoplasty 1996  . Mastectomy partial / lumpectomy 12/28/1998    NL lumpectomy  . Mastectomy partial / lumpectomy 01/07/2010    Re-excision for + margin    PT Assessment/Plan/Recommendation PT Assessment Clinical Impression Statement: pt with multiple medical conditions with decreased mobility and function.  She will need 24/7 assist and will benefit from  continued PT at D/C.  Recommend SNF PT Recommendation/Assessment: Patient will need skilled PT in the acute care venue PT Problem List: Decreased strength;Decreased activity tolerance;Decreased balance;Decreased mobility;Decreased safety awareness;Pain (dizziness) Problem List Comments: pt with chronic dizziness attributed to NPH Barriers to Discharge: Decreased caregiver support Barriers to Discharge Comments: pt needs 24/7 at D/C PT Therapy Diagnosis : Difficulty walking;Abnormality of gait;Generalized weakness (dizziness, chronic pain) PT Plan PT Frequency: Min 3X/week PT Treatment/Interventions: DME instruction;Gait training;Functional mobility training;Therapeutic activities;Therapeutic exercise;Patient/family education PT Recommendation Recommendations for Other Services: OT consult Follow Up Recommendations: Skilled nursing facility Equipment Recommended: Defer to next venue PT Goals  Acute Rehab PT Goals PT Goal Formulation: With patient Time For Goal Achievement: 2 weeks Pt will go Supine/Side to Sit: with min assist PT Goal: Supine/Side to Sit - Progress: Not met Pt will go Sit to Stand: with min assist PT Goal: Sit to Stand - Progress: Not met Pt will go Stand to Sit: with min assist PT Goal: Stand to Sit - Progress: Not met Pt will Ambulate: 16 - 50 feet PT Goal: Ambulate - Progress: Not met  PT Evaluation Precautions/Restrictions    Prior Functioning  Home Living Lives With: Alone Type of Home: House (pt interested in snf prior to D/C to home) Home Adaptive Equipment: Walker - rolling Prior  Function Level of Independence: Requires assistive device for independence (pt's function has been declining) Cognition Cognition Arousal/Alertness: Awake/alert Overall Cognitive Status: Appears within functional limits for tasks assessed Sensation/Coordination   Extremity Assessment RLE Assessment RLE Assessment: Exceptions to [facility] (pt c/o knee pain and limited  active  motion because of pain) LLE Assessment LLE Assessment: Exceptions to WFL LLE AROM (degrees) LLE Overall AROM Comments: pt c/o pain in knee that limits active ROM Mobility (including Balance) Bed Mobility Bed Mobility: Yes Supine to Sit: 1: +2 Total assist;HOB elevated (Comment degrees) (>60, pt ~ 20%) Supine to Sit Details (indicate cue type and reason): pt needed significant assist to bring legs to edge of be and sit at edge Transfers Transfers: Yes Sit to Stand: 1: +2 Total assist;From bed;From chair/3-in-1 (pt = 50%) Sit to Stand Details (indicate cue type and reason): pt needs assist to lift hips off surface and shift weight over onto feet Stand to Sit: 1: +2 Total assist (pt = 50%) Stand to Sit Details: cues to reach back and control deceleration Ambulation/Gait Ambulation/Gait: Yes Ambulation/Gait Assistance: 3: Mod assist Ambulation/Gait Assistance Details (indicate cue type and reason): small steps to move around to get in front of SBSC Ambulation Distance (Feet): 3 Feet Assistive device: Rolling walker Gait Pattern: Antalgic;Step-to pattern;Decreased step length - right;Decreased step length - left;Trunk flexed Gait velocity: slow    Exercise    End of Session PT - End of Session Equipment Utilized During Treatment: Gait belt Activity Tolerance: Treatment limited secondary to medical complications (Comment) (limited by dizziness) Patient left: in chair Nurse Communication: Mobility status for transfers;Mobility status for ambulation General Behavior During Session: [facility] for tasks performed (needs encouragement) Cognition: WFL for tasks performed  Donnetta Hail 12/22/2010, 1:41 PM

## 2010-12-23 LAB — LIPID PANEL
Cholesterol: 138 mg/dL (ref 0–200)
HDL: 24 mg/dL — ABNORMAL LOW (ref >39)
Total CHOL/HDL Ratio: 5.8 RATIO
Triglycerides: 128 mg/dL (ref <150)
VLDL: 26 mg/dL (ref 0–40)

## 2010-12-23 LAB — BASIC METABOLIC PANEL
CO2: 23 mEq/L (ref 19–32)
Calcium: 8.8 mg/dL (ref 8.4–10.5)
Creatinine, Ser: 0.71 mg/dL (ref 0.50–1.10)
GFR calc Af Amer: >90 mL/min (ref >90)
GFR calc non Af Amer: 79 mL/min — ABNORMAL LOW (ref >90)
Sodium: 134 mEq/L — ABNORMAL LOW (ref 135–145)

## 2010-12-23 LAB — CBC
MCH: 29.9 pg (ref 26.0–34.0)
MCHC: 32.8 g/dL (ref 30.0–36.0)
MCV: 91 fL (ref 78.0–100.0)
Platelets: 228 10*3/uL (ref 150–400)
RDW: 12.6 % (ref 11.5–15.5)

## 2010-12-23 LAB — GLUCOSE, CAPILLARY
Glucose-Capillary: 104 mg/dL — ABNORMAL HIGH (ref 70–99)
Glucose-Capillary: 96 mg/dL (ref 70–99)
Glucose-Capillary: 100 mg/dL — ABNORMAL HIGH (ref 70–99)

## 2010-12-23 LAB — PRO B NATRIURETIC PEPTIDE: Pro B Natriuretic peptide (BNP): 3135 pg/mL — ABNORMAL HIGH (ref 0–450)

## 2010-12-23 MED ORDER — TRAMADOL HCL 50 MG PO TABS
25.0000 mg | ORAL_TABLET | Freq: Four times a day (QID) | ORAL | Status: DC
  Administered 2010-12-23 – 2010-12-28 (×22): 25 mg via ORAL
  Filled 2010-12-23 (×28): qty 1

## 2010-12-23 NOTE — Progress Notes (Signed)
Psychosocial assessment completed and placed in shadow chart.   CSW met w/pt. Explained need for SNF. Patient is agreeable to same. Patient states that she has previously been at [facility]: [place] and would like to go back there. CSW confirmed same with patients nephew, Tacey Heap, [phone]. FL-2 completed. Information was sent to [facility] in [place]. Will follow for discharge. Dyanara Cozza C. Amee Boothe MSW, LCSW [phone]

## 2010-12-23 NOTE — Progress Notes (Signed)
Subjective: Knee pains are less. Patient feels better, but appears confused.  Objective: Vital signs in last 24 hours: Temp:  [98.3 F (36.8 C)-98.6 F (37 C)] 98.6 F (37 C) (12/06 1421) Pulse Rate:  [90-99] 99  (12/06 1421) Resp:  [16-18] 16  (12/06 1421) BP: (139-155)/(65-70) 139/65 mmHg (12/06 1421) SpO2:  [96 %-97 %] 97 % (12/06 1421) Weight change:  Last BM Date: 12/22/10  Intake/Output from previous day: 12/05 0701 - 12/06 0700 In: 600 [P.O.:600] Out: 900 [Urine:900] Total I/O In: 480 [P.O.:480] Out: -    Physical Exam:  General: Comfortable, alert, communicative, disoriented, not short of breath at rest.  HEENT:  Mild clinical pallor, no jaundice, no conjunctival injection or discharge. Hydration seems fair. NECK:  Supple, JVP not seen, no carotid bruits, no palpable lymphadenopathy, no palpable goiter. CHEST:  Clinically clear, no wheeze, no crackles. HEART:  Sounds 1 and 2 heard, normal, regular, no murmurs. ABDOMEN:  Full, soft, non-tender, no palpable organomegaly, no palpable masses, normal bowel sounds. GENITALIA:  Not examined. LOWER EXTREMITIES:  No pitting edema, palpable peripheral pulses. MUSCULOSKELETAL SYSTEM:  Generalized osteoarthritic changes, no knee effusion, has full ROM, but does experience pain on knee flexion bilaterally, Lt> Rt.. CENTRAL NERVOUS SYSTEM:  No focal neurologic deficit on gross examination.  Lab Results:  [facility] 12/23/10 0334 12/22/10 0319  WBC 11.4* 14.6*  HGB 10.0* 9.8*  HCT 30.5* 29.5*  PLT 228 210    Basename 12/23/10 0334 12/22/10 0115  NA 134* 132*  K 4.4 3.9  CL 104 101  CO2 23 24  GLUCOSE 109* 164*  BUN 19 25*  CREATININE 0.71 0.74  CALCIUM 8.8 8.6   Recent Results (from the past 240 hour(s))  URINE CULTURE     Status: Normal   Collection Time   12/21/10  1:20 AM      Component Value Range Status Comment   Specimen Description URINE, CLEAN CATCH   Final    Special Requests Normal   Final    Setup Time  [id number]   Final    Colony Count NO GROWTH   Final    Culture NO GROWTH   Final    Report Status 12/22/2010 FINAL   Final      Studies/Results: No results found.  Medications: Scheduled Meds:    . aspirin  81 mg Oral Daily  . donepezil  10 mg Oral QHS  . enoxaparin  40 mg Subcutaneous Q24H  . insulin aspart  0-15 Units Subcutaneous TID WC  . insulin aspart  0-5 Units Subcutaneous QHS  . levofloxacin  500 mg Oral QHS  . naproxen  500 mg Oral BID WC  . olmesartan  40 mg Oral Daily  . pantoprazole  40 mg Oral Daily  . sertraline  25 mg Oral Daily  . sodium chloride  10 mL Intravenous Q12H  . traMADol  25 mg Oral QID  . Travoprost (BAK Free)  1 drop Both Eyes UD  . DISCONTD: moxifloxacin  400 mg Intravenous Q24H   Continuous Infusions:    . DISCONTD: 0.9 % NaCl with KCl 20 mEq / L 75 mL/hr at 12/22/10 0744   PRN Meds:.acetaminophen, acetaminophen, alum & mag hydroxide-simeth, HYDROcodone-acetaminophen, meclizine, morphine, ondansetron (ZOFRAN) IV, ondansetron  Assessment/Plan:  Principal Problem:  *Pain in both knees: Secondary to osteoarthritis. Now on schedule tramadol, with some improvement. Await  PT/OT input. Patient's primary Ortho is Dr Jillyn Hidden. Active Problems: 1.  DIABETES MELLITUS, TYPE II: Controlled. Continue current management.  2. HYPERLIPIDEMIA: TG is 128, LDL is 88.TSH is 2.428  3. HYPERTENSION: Controlled.  4. Hx Breast cancer (IDC) Right, Stage one: No Issues.  5. Pyuria. Will treat for possible early UTI, with Levaquin. Now day #2/7  6. Mild fluid overload vs CHF: BNP is 3135. Iv fluids were discontinued on 12/22/10 and clinically, patient does not seem in overt CHF. Awaiting 2D Echo.  Comment: Will definitely need placement.  LOS: 3 days   Zoey Gilkeson,CHRISTOPHER 12/23/2010, 2:38 PM

## 2010-12-23 NOTE — Progress Notes (Signed)
*  PRELIMINARY RESULTS* Echocardiogram 2D Echocardiogram has been performed.  Cathie Beams Deneen 12/23/2010, 4:31 PM

## 2010-12-24 ENCOUNTER — Inpatient Hospital Stay (HOSPITAL_COMMUNITY): Payer: Medicare Other

## 2010-12-24 LAB — PRO B NATRIURETIC PEPTIDE: Pro B Natriuretic peptide (BNP): 86.2 pg/mL (ref 0–450)

## 2010-12-24 LAB — BASIC METABOLIC PANEL
CO2: 22 mEq/L (ref 19–32)
Calcium: 8.6 mg/dL (ref 8.4–10.5)
GFR calc non Af Amer: 81 mL/min — ABNORMAL LOW (ref >90)
Potassium: 3.7 mEq/L (ref 3.5–5.1)
Sodium: 131 mEq/L — ABNORMAL LOW (ref 135–145)

## 2010-12-24 LAB — GLUCOSE, CAPILLARY
Glucose-Capillary: 118 mg/dL — ABNORMAL HIGH (ref 70–99)
Glucose-Capillary: 122 mg/dL — ABNORMAL HIGH (ref 70–99)

## 2010-12-24 LAB — CBC
Hemoglobin: 8.8 g/dL — ABNORMAL LOW (ref 12.0–15.0)
MCH: 30.3 pg (ref 26.0–34.0)
Platelets: 194 10*3/uL (ref 150–400)
RBC: 2.9 MIL/uL — ABNORMAL LOW (ref 3.87–5.11)

## 2010-12-24 NOTE — Progress Notes (Signed)
Subjective: Improving knee pains. Less confused today..  Objective: Vital signs in last 24 hours: Temp:  [97.8 F (36.6 C)-98.6 F (37 C)] 97.8 F (36.6 C) (12/07 0655) Pulse Rate:  [95-100] 95  (12/07 0655) Resp:  [16-20] 18  (12/07 0655) BP: (129-148)/(65-72) 129/72 mmHg (12/07 0655) SpO2:  [97 %] 97 % (12/07 0655) Weight change:  Last BM Date: 12/22/10  Intake/Output from previous day: 12/06 0701 - 12/07 0700 In: 480 [P.O.:480] Out: -  Total I/O In: -  Out: 200 [Urine:200]   Physical Exam:  General: Comfortable, not short of breath at rest.  HEENT:  Mild clinical pallor, no jaundice, no conjunctival injection or discharge. Hydration seems fair. NECK:  Supple, JVP not seen, no carotid bruits, no palpable lymphadenopathy, no palpable goiter. CHEST:  Clinically clear, no wheeze, few crackles left base. HEART:  Sounds 1 and 2 heard, normal, regular, no murmurs. ABDOMEN:  Full, soft, non-tender, no palpable organomegaly, no palpable masses, normal bowel sounds. GENITALIA:  Not examined. LOWER EXTREMITIES:  No pitting edema, palpable peripheral pulses. MUSCULOSKELETAL SYSTEM:  Generalized osteoarthritic changes, no knee effusion, has full ROM, but does experience pain on knee flexion bilaterally, Lt> Rt. CENTRAL NERVOUS SYSTEM:  No focal neurologic deficit on gross examination.  Lab Results:  [facility] 12/24/10 0316 12/23/10 0334  WBC 7.9 11.4*  HGB 8.8* 10.0*  HCT 26.4* 30.5*  PLT 194 228    Basename 12/24/10 0316 12/23/10 0334  NA 131* 134*  K 3.7 4.4  CL 100 104  CO2 22 23  GLUCOSE 108* 109*  BUN 17 19  CREATININE 0.64 0.71  CALCIUM 8.6 8.8   Recent Results (from the past 240 hour(s))  URINE CULTURE     Status: Normal   Collection Time   12/21/10  1:20 AM      Component Value Range Status Comment   Specimen Description URINE, CLEAN CATCH   Final    Special Requests Normal   Final    Setup Time [id number]   Final    Colony Count NO GROWTH   Final    Culture NO GROWTH   Final    Report Status 12/22/2010 FINAL   Final      Studies/Results: No results found.  Medications: Scheduled Meds:    . aspirin  81 mg Oral Daily  . donepezil  10 mg Oral QHS  . enoxaparin  40 mg Subcutaneous Q24H  . insulin aspart  0-15 Units Subcutaneous TID WC  . insulin aspart  0-5 Units Subcutaneous QHS  . levofloxacin  500 mg Oral QHS  . naproxen  500 mg Oral BID WC  . olmesartan  40 mg Oral Daily  . pantoprazole  40 mg Oral Daily  . sertraline  25 mg Oral Daily  . sodium chloride  10 mL Intravenous Q12H  . traMADol  25 mg Oral QID  . Travoprost (BAK Free)  1 drop Both Eyes UD   Continuous Infusions:   PRN Meds:.acetaminophen, acetaminophen, alum & mag hydroxide-simeth, HYDROcodone-acetaminophen, meclizine, morphine, ondansetron (ZOFRAN) IV, ondansetron  Assessment/Plan:  Principal Problem:  *Pain in both knees: Secondary to osteoarthritis. Improved symptomatically on scheduled tramadol, with some improvement. Await  PT/OT input. Patient's primary Ortho is Dr Jillyn Hidden. Patient can follow up with him on discharge. Active Problems: 1.  DIABETES MELLITUS, TYPE II: Controlled. Continue current management.  2. HYPERLIPIDEMIA: TG is 128, LDL is 88.TSH is 2.428  3. HYPERTENSION: Controlled.  4. Hx Breast cancer (IDC) Right, Stage one: No Issues.  5. Pyuria. Will treat for possible early UTI, with Levaquin. Now day #3/7. No Pyrexia, wcc normal today.  6. Mild fluid overload vs CHF: BNP was 3135 on 12/23/2010, now 86. Iv fluids were discontinued on 12/22/10 and clinically, patient does not seem in overt CHF. Per 2D Echo of 12/23/10, EF is 65%-70%.  Comment: Awaiting placement. Will repeat CXR today.  LOS: 4 days   Courtney Castillo,CHRISTOPHER 12/24/2010, 11:48 AM

## 2010-12-24 NOTE — Progress Notes (Signed)
Physical Therapy Treatment Patient Details Name: Courtney Castillo MRN: [id number] DOB: April 17, 1927 Today's Date: 12/24/2010 1345-1416 1GT, 1TA  PT Assessment/Plan  PT - Assessment/Plan Comments on Treatment Session: Patient improved over last session with tolerance to weight bearing and mobility.  Still at risk with decreased safety and imbalance.  Agree with SNF level rehab. PT Plan: Discharge plan remains appropriate PT Frequency: Min 3X/week Follow Up Recommendations: Skilled nursing facility Equipment Recommended: Defer to next venue PT Goals  Acute Rehab PT Goals PT Goal: Supine/Side to Sit - Progress: Progressing toward goal PT Goal: Sit to Stand - Progress: Progressing toward goal PT Goal: Stand to Sit - Progress: Progressing toward goal Pt will Ambulate: >150 feet;with supervision PT Goal: Ambulate - Progress: Revised (modified due to lack of progress/goal met) (Goal met)  PT Treatment Precautions/Restrictions  Precautions Precautions: Fall Restrictions Weight Bearing Restrictions: No Mobility (including Balance) Bed Mobility Supine to Sit: 3: Mod assist Supine to Sit Details (indicate cue type and reason): to assist with LE's and scoot to EOB Transfers Sit to Stand: 3: Mod assist;With upper extremity assist;From bed Sit to Stand Details (indicate cue type and reason): initial posterior balance. Stand to Sit: 4: Min assist;To chair/3-in-1;With upper extremity assist Stand to Sit Details: initially to 3:1 then to chair with cues for safety Squat Pivot Transfers: 3: Mod assist;With upper extremity assistance Squat Pivot Transfer Details (indicate cue type and reason): bed to 3:1 with assist and cued for forward weight shift and hand placement Ambulation/Gait Ambulation/Gait Assistance: 3: Mod assist Ambulation/Gait Assistance Details (indicate cue type and reason): assist for stability and walker safety Ambulation Distance (Feet): 90 Feet Assistive device: Rolling  walker Gait Pattern: Antalgic;Decreased stride length;Trunk flexed    Exercise  General Exercises - Lower Extremity Ankle Circles/Pumps: AROM;Both;5 reps;Supine Heel Slides: AROM;Both;5 reps;Supine End of Session PT - End of Session Equipment Utilized During Treatment: Gait belt Activity Tolerance: Patient limited by pain Patient left: in chair;with call bell in reach General Behavior During Session: [facility] for tasks performed Cognition: [facility] for tasks performed (evidence of decr. memory w/ recall of prev therapy events)  Jeovanni Heuring,CYNDI 12/24/2010, 3:23 PM

## 2010-12-25 LAB — CBC
MCH: 30.9 pg (ref 26.0–34.0)
MCHC: 34 g/dL (ref 30.0–36.0)
Platelets: 245 10*3/uL (ref 150–400)

## 2010-12-25 LAB — HEMOGLOBIN A1C: Hgb A1c MFr Bld: 6.3 % — ABNORMAL HIGH (ref <5.7)

## 2010-12-25 LAB — BASIC METABOLIC PANEL
Calcium: 8.6 mg/dL (ref 8.4–10.5)
GFR calc Af Amer: 70 mL/min — ABNORMAL LOW (ref >90)
GFR calc non Af Amer: 61 mL/min — ABNORMAL LOW (ref >90)
Potassium: 4.3 mEq/L (ref 3.5–5.1)
Sodium: 136 mEq/L (ref 135–145)

## 2010-12-25 NOTE — Progress Notes (Signed)
Subjective: Improving knee pains. Forgetful, but does not appear confused today. Eating with gusto. Ambulated with PT/OT on 12/24/10.  Objective: Vital signs in last 24 hours: Temp:  [98.3 F (36.8 C)-98.7 F (37.1 C)] 98.7 F (37.1 C) (12/08 0550) Pulse Rate:  [86-98] 87  (12/08 0550) Resp:  [18-19] 18  (12/08 0550) BP: (139-164)/(73-83) 139/73 mmHg (12/08 0550) SpO2:  [95 %-97 %] 95 % (12/08 0550) Weight change:  Last BM Date: 12/24/10  Intake/Output from previous day: 12/07 0701 - 12/08 0700 In: 390 [P.O.:390] Out: 1500 [Urine:1500]     Physical Exam: General: Comfortable, not short of breath at rest.  HEENT:  Mild clinical pallor, no jaundice, no conjunctival injection or discharge. Hydration is satisfactory. NECK:  Supple, JVP not seen, no carotid bruits, no palpable lymphadenopathy, no palpable goiter. CHEST:  Clinically clear, no wheeze, few crackles left base. HEART:  Sounds 1 and 2 heard, normal, regular, no murmurs. ABDOMEN:  Full, soft, non-tender, no palpable organomegaly, no palpable masses, normal bowel sounds. GENITALIA:  Not examined. LOWER EXTREMITIES:  No pitting edema, palpable peripheral pulses. MUSCULOSKELETAL SYSTEM:  Generalized osteoarthritic changes, no knee effusion, has full ROM, but does experience pain on knee flexion bilaterally, Lt> Rt. CENTRAL NERVOUS SYSTEM:  No focal neurologic deficit on gross examination.  Lab Results:  [facility] 12/25/10 0347 12/24/10 0316  WBC 10.4 7.9  HGB 8.8* 8.8*  HCT 25.9* 26.4*  PLT 245 194    Basename 12/25/10 0347 12/24/10 0316  NA 136 131*  K 4.3 3.7  CL 103 100  CO2 26 22  GLUCOSE 110* 108*  BUN 18 17  CREATININE 0.87 0.64  CALCIUM 8.6 8.6   Recent Results (from the past 240 hour(s))  URINE CULTURE     Status: Normal   Collection Time   12/21/10  1:20 AM      Component Value Range Status Comment   Specimen Description URINE, CLEAN CATCH   Final    Special Requests Normal   Final    Setup Time  [id number]   Final    Colony Count NO GROWTH   Final    Culture NO GROWTH   Final    Report Status 12/22/2010 FINAL   Final      Studies/Results: Dg Chest 2 View  12/24/2010  *RADIOLOGY REPORT*  Clinical Data: Rule out pneumonia  CHEST - 2 VIEW  Comparison: 12/21/2010  Findings: The heart and mediastinal contours are stable.  Increased interstitial markings are less apparent on today's exam. Increased linear markings in the posterior segment of the left lower lobe are associated with posterior retraction of the major fissure suggesting this is likely due to atelectasis rather than an area of focal bronchopneumonia.  This appears unchanged in comparison with prior exam.  No other focal infiltrates or signs of congestive failure seen.  Bony structures appear intact.  IMPRESSION: Findings most compatible with left lower lobe volume loss. Improvement in interstitial prominence suggests interval improvement in interstitial edema.  Original Report Authenticated By: Bertha Stakes, M.D.    Medications: Scheduled Meds:    . aspirin  81 mg Oral Daily  . donepezil  10 mg Oral QHS  . enoxaparin  40 mg Subcutaneous Q24H  . insulin aspart  0-15 Units Subcutaneous TID WC  . insulin aspart  0-5 Units Subcutaneous QHS  . levofloxacin  500 mg Oral QHS  . naproxen  500 mg Oral BID WC  . olmesartan  40 mg Oral Daily  . pantoprazole  40 mg Oral Daily  . sertraline  25 mg Oral Daily  . sodium chloride  10 mL Intravenous Q12H  . traMADol  25 mg Oral QID  . Travoprost (BAK Free)  1 drop Both Eyes UD   Continuous Infusions:   PRN Meds:.acetaminophen, acetaminophen, alum & mag hydroxide-simeth, HYDROcodone-acetaminophen, meclizine, morphine, ondansetron (ZOFRAN) IV, ondansetron  Assessment/Plan:  Principal Problem:  *Pain in both knees: Secondary to osteoarthritis. Improved symptomatically on scheduled Tramadol. Ambulated 90 yards with walker, and PT/OT input. Patient's primary Ortho is Dr Jillyn Hidden.  Patient can follow up with him on discharge. Active Problems: 1.  DIABETES MELLITUS, TYPE II: Controlled. Continue current management.  2. HYPERLIPIDEMIA: TG is 128, LDL is 88.TSH is 2.428  3. HYPERTENSION: Controlled.  4. Hx Breast cancer ([facility]C) Right, Stage one: No Issues.  5. Pyuria. Will treat for possible early UTI, with Levaquin. Now day #4/7. No Pyrexia, wcc normal.  6. Mild fluid overload vs CHF: BNP was 3135 on 12/23/2010, now 86. Iv fluids were discontinued on 12/22/10 and clinically, patient does not seem in overt CHF. Per 2D Echo of 12/23/10, EF is 65%-70%.  Comment: Awaiting placement.  LOS: 5 days   Schylar Allard,CHRISTOPHER 12/25/2010, 9:07 AM

## 2010-12-26 LAB — BASIC METABOLIC PANEL
CO2: 28 mEq/L (ref 19–32)
Calcium: 9.1 mg/dL (ref 8.4–10.5)
Creatinine, Ser: 0.79 mg/dL (ref 0.50–1.10)
Glucose, Bld: 106 mg/dL — ABNORMAL HIGH (ref 70–99)

## 2010-12-26 LAB — GLUCOSE, CAPILLARY
Glucose-Capillary: 124 mg/dL — ABNORMAL HIGH (ref 70–99)
Glucose-Capillary: 116 mg/dL — ABNORMAL HIGH (ref 70–99)

## 2010-12-26 MED ORDER — AMLODIPINE BESYLATE 5 MG PO TABS
5.0000 mg | ORAL_TABLET | Freq: Every day | ORAL | Status: DC
  Administered 2010-12-26 – 2010-12-28 (×3): 5 mg via ORAL
  Filled 2010-12-26 (×4): qty 1

## 2010-12-26 NOTE — Progress Notes (Signed)
Subjective: No new issues.  Objective: Vital signs in last 24 hours: Temp:  [98.1 F (36.7 C)-98.2 F (36.8 C)] 98.1 F (36.7 C) (12/09 0535) Pulse Rate:  [69-96] 96  (12/09 0535) Resp:  [17-18] 18  (12/09 0535) BP: (141-149)/(66-86) 141/69 mmHg (12/09 0535) SpO2:  [96 %-97 %] 97 % (12/09 0535) Weight change:  Last BM Date: 12/25/10  Intake/Output from previous day: 12/08 0701 - 12/09 0700 In: 720 [P.O.:720] Out: 1901 [Urine:1900; Stool:1]     Physical Exam: General: Comfortable, not short of breath at rest. Ate a good breakfast.  HEENT:  Mild clinical pallor, no jaundice, no conjunctival injection or discharge. Hydration is satisfactory. NECK:  Supple, JVP not seen, no carotid bruits, no palpable lymphadenopathy, no palpable goiter. CHEST:  Clinically clear, no wheeze, few crackles left base. HEART:  Sounds 1 and 2 heard, normal, regular, no murmurs. ABDOMEN:  Full, soft, non-tender, no palpable organomegaly, no palpable masses, normal bowel sounds. GENITALIA:  Not examined. LOWER EXTREMITIES:  No pitting edema, palpable peripheral pulses. MUSCULOSKELETAL SYSTEM:  Generalized osteoarthritic changes, no knee effusion, has full ROM, bilaterally, and only minimal discomfort. CENTRAL NERVOUS SYSTEM:  No focal neurologic deficit on gross examination.  Lab Results:  [facility] 12/25/10 0347 12/24/10 0316  WBC 10.4 7.9  HGB 8.8* 8.8*  HCT 25.9* 26.4*  PLT 245 194    Basename 12/26/10 0347 12/25/10 0347  NA 135 136  K 4.3 4.3  CL 100 103  CO2 28 26  GLUCOSE 106* 110*  BUN 16 18  CREATININE 0.79 0.87  CALCIUM 9.1 8.6   Recent Results (from the past 240 hour(s))  URINE CULTURE     Status: Normal   Collection Time   12/21/10  1:20 AM      Component Value Range Status Comment   Specimen Description URINE, CLEAN CATCH   Final    Special Requests Normal   Final    Setup Time [id number]   Final    Colony Count NO GROWTH   Final    Culture NO GROWTH   Final    Report  Status 12/22/2010 FINAL   Final      Studies/Results: Dg Chest 2 View  12/24/2010  *RADIOLOGY REPORT*  Clinical Data: Rule out pneumonia  CHEST - 2 VIEW  Comparison: 12/21/2010  Findings: The heart and mediastinal contours are stable.  Increased interstitial markings are less apparent on today's exam. Increased linear markings in the posterior segment of the left lower lobe are associated with posterior retraction of the major fissure suggesting this is likely due to atelectasis rather than an area of focal bronchopneumonia.  This appears unchanged in comparison with prior exam.  No other focal infiltrates or signs of congestive failure seen.  Bony structures appear intact.  IMPRESSION: Findings most compatible with left lower lobe volume loss. Improvement in interstitial prominence suggests interval improvement in interstitial edema.  Original Report Authenticated By: Bertha Stakes, M.D.    Medications: Scheduled Meds:    . aspirin  81 mg Oral Daily  . donepezil  10 mg Oral QHS  . enoxaparin  40 mg Subcutaneous Q24H  . insulin aspart  0-15 Units Subcutaneous TID WC  . insulin aspart  0-5 Units Subcutaneous QHS  . levofloxacin  500 mg Oral QHS  . naproxen  500 mg Oral BID WC  . olmesartan  40 mg Oral Daily  . pantoprazole  40 mg Oral Daily  . sertraline  25 mg Oral Daily  . sodium  chloride  10 mL Intravenous Q12H  . traMADol  25 mg Oral QID  . Travoprost (BAK Free)  1 drop Both Eyes UD   Continuous Infusions:   PRN Meds:.acetaminophen, acetaminophen, alum & mag hydroxide-simeth, HYDROcodone-acetaminophen, meclizine, morphine, ondansetron (ZOFRAN) IV, ondansetron  Assessment/Plan:  Principal Problem:  *Pain in both knees: Secondary to osteoarthritis. Much improved symptomatically on scheduled Tramadol. Ambulated 90 yards with walker, and PT/OT input on 12/24/10. Patient's primary Ortho is Dr Jillyn Hidden. Patient can follow up with him on discharge. Active Problems: 1.  DIABETES  MELLITUS, TYPE II: Controlled. Continue current management.  2. HYPERLIPIDEMIA: TG is 128, LDL is 88.TSH is 2.428  3. HYPERTENSION: Sub-optimal control. Will add Norvasc 5 mg daily, to treatment.  4. Hx Breast cancer ([facility]) Right, Stage one: No Issues.  5. Pyuria. Treating for possible early UTI, with Levaquin. Now day #5/7. No Pyrexia, wcc normal.  6. Mild fluid overload vs CHF: BNP was 3135 on 12/23/2010, now 86. Iv fluids were discontinued on 12/22/10 and clinically, patient does not seem in overt CHF. Per 2D Echo of 12/23/10, EF is 65%-70%.  Comment: Awaiting placement. Continue current care, otherwise.  LOS: 6 days   Karsin Pesta,CHRISTOPHER 12/26/2010, 9:09 AM

## 2010-12-27 LAB — GLUCOSE, CAPILLARY
Glucose-Capillary: 119 mg/dL — ABNORMAL HIGH (ref 70–99)
Glucose-Capillary: 111 mg/dL — ABNORMAL HIGH (ref 70–99)
Glucose-Capillary: 98 mg/dL (ref 70–99)

## 2010-12-27 NOTE — Progress Notes (Signed)
Per MD, Pt not ready for d/c.  CSW to continue to follow.  Amanda Harmon Bommarito, LCSWA Clinical Social Work [phone]  

## 2010-12-27 NOTE — Progress Notes (Signed)
Subjective: Clinically stable, no new issues..  Objective: Vital signs in last 24 hours: Temp:  [98 F (36.7 C)-98.4 F (36.9 C)] 98 F (36.7 C) (12/10 1333) Pulse Rate:  [87-103] 87  (12/10 1333) Resp:  [18-20] 18  (12/10 1333) BP: (119-151)/(67-71) 119/71 mmHg (12/10 1333) SpO2:  [95 %-97 %] 97 % (12/10 1333) Weight change:  Last BM Date: 12/26/10  Intake/Output from previous day: 12/09 0701 - 12/10 0700 In: 1145 [P.O.:1145] Out: 3051 [Urine:3050; Stool:1] Total I/O In: 360 [P.O.:360] Out: 650 [Urine:650]   Physical Exam: General: Comfortable, not short of breath at rest.  HEENT:  Mild clinical pallor, no jaundice, no conjunctival injection or discharge. Hydration is satisfactory. NECK:  Supple, JVP not seen, no carotid bruits, no palpable lymphadenopathy, no palpable goiter. CHEST:  Clinically clear, no wheeze, few crackles left base. HEART:  Sounds 1 and 2 heard, normal, regular, no murmurs. ABDOMEN:  Full, soft, non-tender, no palpable organomegaly, no palpable masses, normal bowel sounds. GENITALIA:  Not examined. LOWER EXTREMITIES:  No pitting edema, palpable peripheral pulses. MUSCULOSKELETAL SYSTEM:  Generalized osteoarthritic changes, no knee effusion, has full ROM, bilaterally, and only minimal discomfort. CENTRAL NERVOUS SYSTEM:  No focal neurologic deficit on gross examination.  Lab Results:  [facility] 12/25/10 0347  WBC 10.4  HGB 8.8*  HCT 25.9*  PLT 245    Basename 12/26/10 0347 12/25/10 0347  NA 135 136  K 4.3 4.3  CL 100 103  CO2 28 26  GLUCOSE 106* 110*  BUN 16 18  CREATININE 0.79 0.87  CALCIUM 9.1 8.6   Recent Results (from the past 240 hour(s))  URINE CULTURE     Status: Normal   Collection Time   12/21/10  1:20 AM      Component Value Range Status Comment   Specimen Description URINE, CLEAN CATCH   Final    Special Requests Normal   Final    Setup Time [id number]   Final    Colony Count NO GROWTH   Final    Culture NO GROWTH    Final    Report Status 12/22/2010 FINAL   Final      Studies/Results: No results found.  Medications: Scheduled Meds:    . amLODipine  5 mg Oral Daily  . aspirin  81 mg Oral Daily  . donepezil  10 mg Oral QHS  . enoxaparin  40 mg Subcutaneous Q24H  . insulin aspart  0-15 Units Subcutaneous TID WC  . insulin aspart  0-5 Units Subcutaneous QHS  . levofloxacin  500 mg Oral QHS  . naproxen  500 mg Oral BID WC  . olmesartan  40 mg Oral Daily  . pantoprazole  40 mg Oral Daily  . sertraline  25 mg Oral Daily  . sodium chloride  10 mL Intravenous Q12H  . traMADol  25 mg Oral QID  . Travoprost (BAK Free)  1 drop Both Eyes UD   Continuous Infusions:   PRN Meds:.acetaminophen, acetaminophen, alum & mag hydroxide-simeth, HYDROcodone-acetaminophen, meclizine, morphine, ondansetron (ZOFRAN) IV, ondansetron  Assessment/Plan:  Principal Problem:  *Pain in both knees: Secondary to osteoarthritis. Much improved symptomatically on scheduled Tramadol. Ambulated 90 yards with walker, and PT/OT input on 12/24/10. Patient's primary Ortho is Dr Jillyn Hidden. Patient can follow up with him on discharge, for her usual intraarticular injections. Active Problems: 1.  DIABETES MELLITUS, TYPE II: Controlled. Continue current management.  2. HYPERLIPIDEMIA: TG is 128, LDL is 88.TSH is 2.428  3. HYPERTENSION: Sub-optimal control. Will add Norvasc 5  mg daily, to treatment.  4. Hx Breast cancer ([facility]) Right, Stage one: No Issues.  5. Pyuria. Treating for possible early UTI, with Levaquin. Now day #5/7. No Pyrexia, wcc normal.  6. Mild fluid overload vs CHF: BNP was 3135 on 12/23/2010, now 86. Iv fluids were discontinued on 12/22/10 and clinically, patient does not seem in overt CHF. Per 2D Echo of 12/23/10, EF is 65%-70%.  Comment: Awaiting placement. Continue current care, otherwise.  Disp: For DC as soon as placement available.  LOS: 7 days   Josemiguel Gries,CHRISTOPHER 12/27/2010, 6:12 PM

## 2010-12-28 LAB — GLUCOSE, CAPILLARY: Glucose-Capillary: 108 mg/dL — ABNORMAL HIGH (ref 70–99)

## 2010-12-28 LAB — CREATININE, SERUM: GFR calc Af Amer: 89 mL/min — ABNORMAL LOW (ref >90)

## 2010-12-28 MED ORDER — LEVOFLOXACIN 500 MG PO TABS: 500.0000 mg | ORAL_TABLET | Freq: Every day | ORAL | Status: AC

## 2010-12-28 MED ORDER — AMLODIPINE BESYLATE 5 MG PO TABS: 5.0000 mg | ORAL_TABLET | Freq: Every day | ORAL | Status: DC

## 2010-12-28 MED ORDER — TRAMADOL HCL 50 MG PO TABS: 50.0000 mg | ORAL_TABLET | Freq: Three times a day (TID) | ORAL | Status: AC

## 2010-12-28 MED ORDER — ACETAMINOPHEN 325 MG PO TABS: 650.0000 mg | ORAL_TABLET | Freq: Four times a day (QID) | ORAL | Status: AC | PRN

## 2010-12-28 NOTE — Progress Notes (Signed)
Physical Therapy Treatment Patient Details Name: Courtney Castillo MRN: [id number] DOB: Jun 12, 1927 Today's Date: 12/28/2010 9:45-10:07 G PT Assessment/Plan  PT - Assessment/Plan Comments on Treatment Session: pt limited today by nausea and diarrhea, but is still able to complete mobility at min assist level PT Plan: Discharge plan remains appropriate;Frequency remains appropriate PT Goals  Acute Rehab PT Goals PT Goal: Supine/Side to Sit - Progress: Progressing toward goal PT Goal: Sit to Stand - Progress: Progressing toward goal PT Goal: Stand to Sit - Progress: Progressing toward goal PT Goal: Ambulate - Progress: Progressing toward goal  PT Treatment Precautions/Restrictions  Precautions Precautions: Fall Precaution Comments: pt c/o diarrhea today Restrictions Weight Bearing Restrictions: No Mobility (including Balance) Bed Mobility Supine to Sit: 4: Min assist Transfers Sit to Stand: 4: Min assist Stand to Sit: 4: Min assist Ambulation/Gait Ambulation/Gait Assistance: 4: Min assist Ambulation/Gait Assistance Details (indicate cue type and reason): to [facility] for loose BM, pt with min assist to suport balance, pt limited today by not feeling well Ambulation Distance (Feet): 50 Feet Assistive device: Rolling walker Gait Pattern: Antalgic;Decreased stride length;Trunk flexed Gait velocity: slow, needs encouragement    Exercise    End of Session PT - End of Session Equipment Utilized During Treatment: Gait belt (RW) Activity Tolerance: Treatment limited secondary to medical complications (Comment) (nausea and diarrhea) Patient left: in chair Nurse Communication: Mobility status for transfers;Mobility status for ambulation General Behavior During Session: [facility] for tasks performed (needs encouragement) Cognition: WFL for tasks performed  Donnetta Hail 12/28/2010, 1:22 PM

## 2010-12-28 NOTE — Discharge Summary (Signed)
Physician Discharge Summary  Patient ID: Courtney Castillo MRN: [id number] DOB/AGE: [id number] 75 y.o.  Admit date: 12/20/2010 Discharge date: 12/28/2010  Primary Care Physician:  Roxy Manns, MD, MD   Discharge Diagnoses:    Patient Active Problem List  Diagnoses  . DIABETES MELLITUS, TYPE II  . HYPERLIPIDEMIA  . ANEMIA  . CATARACTS  . HYPERTENSION  . ALLERGIC RHINITIS  . REACTIVE AIRWAY DISEASE  . GERD  . DIVERTICULOSIS, COLON  . OSTEOARTHRITIS  . ARTHROPATHY NOS, MULTIPLE SITES  . POLYMYALGIA RHEUMATICA  . DIZZINESS  . Dementia  . Hx Breast cancer ([facility]C) Right, Stage one  . HX, PERSONAL, ARTHRITIS  . Chest wall pain  . Breast cancer  . Back pain  . Knee pain  . Pain in both knees    Current Discharge Medication List    START taking these medications   Details  acetaminophen (TYLENOL) 325 MG tablet Take 2 tablets (650 mg total) by mouth every 6 (six) hours as needed (or Fever >/= 101). Take 2 tablets (650 mg total) by mouth every 6 (six) hours as needed for mild pain. Qty: 90 tablet, Refills: 0    amLODipine (NORVASC) 5 MG tablet Take 1 tablet (5 mg total) by mouth daily. Qty: 30 tablet, Refills: 0    levofloxacin (LEVAQUIN) 500 MG tablet Take 1 tablet (500 mg total) by mouth at bedtime. Qty: 2 tablet, Refills: 0    traMADol (ULTRAM) 50 MG tablet Take 1 tablet (50 mg total) by mouth 3 (three) times daily. Maximum dose= 8 tablets per day Qty: 30 tablet, Refills: 0      CONTINUE these medications which have NOT CHANGED   Details  aspirin 81 MG tablet Take 81 mg by mouth daily. with meal     donepezil (ARICEPT) 10 MG tablet Take 1 tablet (10 mg total) by mouth at bedtime. Qty: 30 tablet, Refills: 11    fexofenadine (ALLEGRA) 60 MG tablet Take 60 mg by mouth daily as needed.      meclizine (ANTIVERT) 25 MG tablet Take 0.5 tablets (12.5 mg total) by mouth 2 (two) times daily as needed for dizziness. Qty: 30 tablet, Refills: 1    meloxicam (MOBIC) 15 MG tablet  Take 15 mg by mouth daily as needed. with meal     Multiple Vitamin (MULTI-VITAMIN PO) Take by mouth.      pantoprazole (PROTONIX) 40 MG tablet Take 40 mg by mouth daily as needed. indigestion    sertraline (ZOLOFT) 25 MG tablet Take 1 tablet (25 mg total) by mouth daily. Qty: 30 tablet, Refills: 11    solifenacin (VESICARE) 5 MG tablet Take 1 tablet (5 mg total) by mouth daily. Qty: 30 tablet, Refills: 5    travoprost, benzalkonium, (TRAVATAN) 0.004 % ophthalmic solution as directed.     valsartan (DIOVAN) 80 MG tablet Take one and one half tablets by mouth every morning. Qty: 45 tablet, Refills: 6      STOP taking these medications     hydrochlorothiazide (HYDRODIURIL) 50 MG tablet          Disposition and Follow-up:  Follow up with primary MD and primary orthopedic surgeon.  Consults:  none  None.  Significant Diagnostic Studies:  Dg Knee 2 Views Left  12/20/2010  *RADIOLOGY REPORT*  Clinical Data: Left knee pain.  LEFT KNEE - 1-2 VIEW  Comparison: Left knee radiographs performed 06/01/2004  Findings: There is no evidence of fracture or dislocation.  The joint spaces are preserved.  Small marginal  osteophytes are noted arising at all three compartments, and at the tibial spine.  Mild sclerotic change is noted at the lateral femoral condyle.  A small joint effusion is seen.  The visualized soft tissues are otherwise unremarkable in appearance.  IMPRESSION:  1.  No evidence of fracture or dislocation. 2.  Mild osteoarthritis at the left knee. 3.  Small joint effusion seen.  Original Report Authenticated By: Tonia Ghent, M.D.   Dg Knee 2 Views Right  12/20/2010  *RADIOLOGY REPORT*  Clinical Data: Right knee pain.  RIGHT KNEE - 1-2 VIEW  Comparison: Right knee radiographs performed 06/01/2004  Findings: There is no evidence of fracture or dislocation.  The joint spaces are preserved.  Small marginal osteophytes are noted arising at all three compartments, and at the tibial  spine; these are slightly worsened from 2006.  A small osseous body posterior to the joint space was not present on the prior study and may reflect a small loose body.  A small joint effusion is seen.  The visualized soft tissues are otherwise unremarkable in appearance.  IMPRESSION:  1.  No evidence of fracture or dislocation. 2.  Mildly worsened mild osteoarthritis at the right knee. 3.  Small joint effusion seen. 4.  Question of small loose body.  Original Report Authenticated By: Tonia Ghent, M.D.   Dg Chest Port 1 View  12/21/2010  *RADIOLOGY REPORT*  Clinical Data: Shortness of breath and leukocytosis.  PORTABLE CHEST - 1 VIEW  Comparison: Chest radiograph performed 05/12/2010  Findings: The lungs are well-aerated.  Vascular congestion is noted, with mildly increased interstitial markings, more prominent on the left, raising concern for minimal interstitial edema. Pneumonia could conceivably have a similar appearance.  There is no evidence of pleural effusion or pneumothorax.  The cardiomediastinal silhouette is borderline enlarged.  No acute osseous abnormalities are seen.  Clips are noted overlying the right axilla.  IMPRESSION: Vascular congestion and borderline cardiomegaly, with mildly increased interstitial markings, more prominent on the left, raising concern for minimal interstitial edema.  Pneumonia could conceivably have a similar appearance.  Original Report Authenticated By: Tonia Ghent, M.D.    Brief H and P: For complete details, refer to admission H and P. However,  in brief, this is an 77 year old demented female living  alone, ambulatory with walker and a cane, with known history of hypertension, and osteoarthritis, requiring periodic intraarticular steroid injection, presenting with inability to ambulate due to the pain in both knees. She was admitted for further evaluation, investigation and management.  Physical Exam: On 12/28/2010. General: Comfortable, not short of breath at  rest.  HEENT: Mild clinical pallor, no jaundice, no conjunctival injection or discharge. Hydration is satisfactory.  NECK: Supple, JVP not seen, no carotid bruits, no palpable lymphadenopathy, no palpable goiter.  CHEST: Clinically clear, no wheeze, few crackles left base.  HEART: Sounds 1 and 2 heard, normal, regular, no murmurs.  ABDOMEN: Full, soft, non-tender, no palpable organomegaly, no palpable masses, normal bowel sounds.  GENITALIA: Not examined.  LOWER EXTREMITIES: No pitting edema, palpable peripheral pulses.  MUSCULOSKELETAL SYSTEM: Generalized osteoarthritic changes, no knee effusion, has full ROM, bilaterally, and only minimal discomfort.  CENTRAL NERVOUS SYSTEM: No focal neurologic deficit on gross examination.   Hospital Course:  Principal Problem:  *Pain in both knees: This was secondary to advanced osteoarthritis. Patient was continued on NSAID, and scheduled Tramadol, with dramatic clinical response. By 12/24/10, she was able to ambulated 90 yards with walker, and PT/OT supervison. Patient's primary  Orthopedic surgeon is Dr Jillyn Hidden. Patient can follow up with him on discharge, for her usual intraarticular injections.  Active Problems:  1. DIABETES MELLITUS, TYPE II: We suspect this is diet-controlled. HBA1C was 6.4, and patient remained euglycemic on diet alone.  2. HYPERLIPIDEMIA: TG is 128, LDL is 88.TSH is 2.428  3. HYPERTENSION: This was controlled with ARB and Norvasc.  4. Hx Breast cancer ([facility]) Right, Stage one: No Issues.  5. Pyuria. Patient was treated for possible early UTI, with a 7 day course of Levaquin. She remained apyrexial, and wcc was normal.  6. Mild fluid overload vs CHF: BNP was 3135 on 12/23/2010. Iv fluids were discontinued on 12/22/10, BNP dropped to 86, and clinically, patient showed no evidence of overt CHF. Per 2D Echo of 12/23/10, EF is 65%-70%.   Comment: Patient was clinically stable for discharge to SNF on 12/28/2010. Note: I did discuss code status  with patient's HPOA, Lenise Herald, tel# [phone] on 12/28/10, and he confirmed that she is DNR.  Time spent on Discharge: 45 mins.  Signed: Dontae Minerva,CHRISTOPHER 12/28/2010, 2:39 PM

## 2010-12-28 NOTE — Progress Notes (Signed)
Per MD, Pt ready.  Notified [facility].  Melissa to call CSW back re: admission.  Notified nephew, [name].    CSW to continue to follow.  Providence Crosby, LCSWA Clinical Social Work [phone]

## 2010-12-28 NOTE — Progress Notes (Signed)
Report called to Lamont Dowdy, RN, at [facility] @ [phone].  Pt being transported by [facility] (has been called by SW).

## 2010-12-28 NOTE — Progress Notes (Signed)
Facility requesting Pt to arrive after lunch.  Providence Crosby, LCSWA Clinical Social Work [phone]

## 2010-12-28 NOTE — Progress Notes (Signed)
Faxed D/c summary to facility.  Confirmed with Melissa at [facility] receipt of d/c summary.  Facility ready to receive Pt.    LM for Pt's nephew of Pt's imminent d/c.  Notified Pt.    RN checked AVS meds against d/c summary.  Contacted [facility].  Pt d/c'd  Providence Crosby, LCSWA Clinical Social Work [phone]

## 2010-12-29 ENCOUNTER — Telehealth: Payer: Self-pay | Admitting: Internal Medicine

## 2010-12-29 NOTE — Telephone Encounter (Signed)
Mr Courtney Castillo notified as instructed by telephone. He will speak with Child psychotherapist in AM.

## 2010-12-29 NOTE — Telephone Encounter (Signed)
Please get a little more info from him about the situation in case I need to request or review any records before I call him , and make sure we have release to speak to him (I think we do) Thanks Let him know it will be later today or tomorrow am before I get a chance

## 2010-12-29 NOTE — Telephone Encounter (Signed)
Concerning her health and going back home from the nursing care facility.  Mr. Cathlean Cower would like to talk with you about this.    Phone: [phone]

## 2010-12-29 NOTE — Telephone Encounter (Signed)
Spoke with Courtney Castillo [employer] form has been signed for [name] to have access to pts medical record.)[name] said he tries to contact pt daily. On 12/20/10 he could not reach pt by phone and went by her home, pt was on couch, could not get up; called ambulance taken to [facility] and was admitted. Pt was transferred upon discharge from hospital to [facility]. Pt is confused on and off. Pt wants to go home and [name] does not think pt can live by herself. Wants to speak with Dr Milinda Antis about pt staying at [facility] versus going home. Pt already has appt with Dr Milinda Antis on 02/15/11.[name] understands it could be tomorrow when he is contacted and can be reached at [phone].

## 2010-12-29 NOTE — Telephone Encounter (Signed)
Let him know I will try to call him tomorrow- on meantime have him ask to speak to a social worker at the rehab to get the ball rolling in terms of placement if she needs it  Send this back to me, thanks

## 2010-12-30 NOTE — Telephone Encounter (Signed)
Discussed her case with him Social workers have not evaluated her yet I agree she would be safer in an assisted living situation but she will likely refuse  Will make sure to make f/u with me at d/c and keep me updated I encoraged him to start looking around for places that could take her if she was agreealble to it

## 2011-01-25 ENCOUNTER — Ambulatory Visit: Payer: Medicare Other | Admitting: Oncology

## 2011-01-25 ENCOUNTER — Other Ambulatory Visit: Payer: Medicare Other | Admitting: Lab

## 2011-01-25 ENCOUNTER — Telehealth: Payer: Self-pay | Admitting: Oncology

## 2011-01-25 NOTE — Telephone Encounter (Signed)
Courtney Castillo @ [facility] [phone]) called to r/s 1/8 appt. Per corey pt just came to them a few days ago and the papers read that are appt was @ [facility]. Courtney Castillo given new appt for 1/30 @ 8:30 am.

## 2011-02-15 ENCOUNTER — Telehealth: Payer: Self-pay

## 2011-02-15 ENCOUNTER — Ambulatory Visit: Payer: Medicare Other | Admitting: Family Medicine

## 2011-02-15 DIAGNOSIS — Z0289 Encounter for other administrative examinations: Secondary | ICD-10-CM

## 2011-02-15 NOTE — Telephone Encounter (Signed)
Go ahead and give the verbal ok for those - and can send the paperwork for me to sign, thanks

## 2011-02-15 NOTE — Telephone Encounter (Signed)
Pt is at [facility] and pt is exhibiting definite weakness and difficulty walking also unsteadiness in the building. Request OT and PT orders to strengthen pt. Please call Thayer Ohm at [phone] with Dr Lucretia Roers response.

## 2011-02-15 NOTE — Telephone Encounter (Signed)
Tried to reach [place] but she has left for the day and I am to call her back.

## 2011-02-16 ENCOUNTER — Other Ambulatory Visit: Payer: Self-pay | Admitting: *Deleted

## 2011-02-16 ENCOUNTER — Ambulatory Visit: Payer: Medicare Other | Admitting: Oncology

## 2011-02-16 ENCOUNTER — Other Ambulatory Visit: Payer: Self-pay | Admitting: Family Medicine

## 2011-02-16 ENCOUNTER — Other Ambulatory Visit: Payer: Medicare Other | Admitting: Lab

## 2011-02-16 MED ORDER — CLONAZEPAM 0.5 MG PO TABS: 0.5000 mg | ORAL_TABLET | Freq: Two times a day (BID) | ORAL | Status: AC | PRN

## 2011-02-16 MED ORDER — MECLIZINE HCL 25 MG PO TABS: 12.5000 mg | ORAL_TABLET | Freq: Two times a day (BID) | ORAL | Status: DC | PRN

## 2011-02-16 NOTE — Telephone Encounter (Signed)
Chris notified as instructed by telephone.   

## 2011-02-16 NOTE — Telephone Encounter (Signed)
Patient was seen today for her home health PT eval.  Her frequency will be 2-week 1,3-week 2,1-week 1.  They will work on fall prevention,strengthening and balance.  If you have any questions,you can call Amy.

## 2011-02-16 NOTE — Telephone Encounter (Signed)
[place] ref[place] [facility] 

## 2011-02-21 ENCOUNTER — Encounter: Payer: Self-pay | Admitting: Family Medicine

## 2011-02-21 ENCOUNTER — Ambulatory Visit (INDEPENDENT_AMBULATORY_CARE_PROVIDER_SITE_OTHER): Payer: Medicare Other | Admitting: Family Medicine

## 2011-02-21 VITALS — BP 128/72 | HR 96 | Temp 97.8°F | Ht 60.0 in | Wt 124.2 lb

## 2011-02-21 DIAGNOSIS — F0390 Unspecified dementia without behavioral disturbance: Secondary | ICD-10-CM

## 2011-02-21 DIAGNOSIS — F329 Major depressive disorder, single episode, unspecified: Secondary | ICD-10-CM

## 2011-02-21 DIAGNOSIS — F3289 Other specified depressive episodes: Secondary | ICD-10-CM

## 2011-02-21 DIAGNOSIS — F32A Depression, unspecified: Secondary | ICD-10-CM | POA: Insufficient documentation

## 2011-02-21 DIAGNOSIS — R42 Dizziness and giddiness: Secondary | ICD-10-CM

## 2011-02-21 DIAGNOSIS — M6281 Muscle weakness (generalized): Secondary | ICD-10-CM

## 2011-02-21 MED ORDER — SERTRALINE HCL 25 MG PO TABS: 50.0000 mg | ORAL_TABLET | Freq: Every day | ORAL | Status: DC

## 2011-02-21 MED ORDER — MECLIZINE HCL 25 MG PO TABS: 12.5000 mg | ORAL_TABLET | Freq: Two times a day (BID) | ORAL | Status: DC | PRN

## 2011-02-21 NOTE — Progress Notes (Signed)
Subjective:    Patient ID: Courtney Castillo, female    DOB: [birth year], 78 y.o.   MRN: [id number]  HPI Here for visit for mobility problems/ imbalance and need for PT /OT  Also inc depression Has lost over 20 lb  Since last visit was hosp for uti/ ? Pneumonia and severe arthritis pain  Was put in rehab center Now in [facility]  Also chronic dizziness and dementia   Is in assisted living -- at [facility] -- and likes it  Not as social as she needs to be  Just does not feel confident - feels like she will fall   Not as much pain - knees are not hurting as much  Back is about the same as last time- does not limit her as much   Has a decent appetite- is eating her meals - doing well with that  Memory is about the time   Staff is concerned about her mood  Seems more down and isolates herself more  Eats well however  Interacts appropriately She is open to increasing her ssri  [name] Cathlean Cower is her POA   Patient Active Problem List  Diagnoses  . DIABETES MELLITUS, TYPE II  . HYPERLIPIDEMIA  . ANEMIA  . CATARACTS  . HYPERTENSION  . ALLERGIC RHINITIS  . REACTIVE AIRWAY DISEASE  . GERD  . DIVERTICULOSIS, COLON  . OSTEOARTHRITIS  . ARTHROPATHY NOS, MULTIPLE SITES  . POLYMYALGIA RHEUMATICA  . DIZZINESS  . Dementia  . Hx Breast cancer (IDC) Right, Stage one  . HX, PERSONAL, ARTHRITIS  . Chest wall pain  . Breast cancer  . Back pain  . Knee pain  . Pain in both knees  . Muscle weakness (generalized)  . Depression (emotion)   Past Medical History  Diagnosis Date  . Diabetes mellitus     type II  . Diverticulosis of colon   . Hyperlipidemia   . Hypertension   . Arthritis     osteoarthritis knees  . GERD (gastroesophageal reflux disease)     hiatal hernia  . Allergy     allergic rhinitis/ vasometer  . Lumbar spinal stenosis 05/2007  . 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 Anemia   . Cancer     Hx of breast  CA & recurrent breast CA 11/11  . Dementia 2012  . Anxiety    Past Surgical History  Procedure Date  . Carpal tunnel release   . Eye surgery     cataract extraction  . Abdominal hysterectomy 03/1997    BSO stage 1B adenocarcinoma of endometruim  . Mastectomy w/ nodes partial 12/14/2009    IDC Right, on  . Incisional breast biopsy 10/16/2009  . Shoulder surgery   . Back surgery 2009  . Tympanoplasty 1996  . Mastectomy partial / lumpectomy 12/28/1998    NL lumpectomy  . Mastectomy partial / lumpectomy 01/07/2010    Re-excision for + margin   History  Substance Use Topics  . Smoking status: Never Smoker   . Smokeless tobacco: Never Used  . Alcohol Use: No   Family History  Problem Relation Age of Onset  . Diabetes Mother   . Heart disease Mother     MI  . Kidney disease Father   . Hypertension Father    Allergies  Allergen Reactions  . Sulfamethoxazole W/Trimethoprim     REACTION: Unknown reaction it has been years since patient took it  and she only knows it was a Sulfa drug she took.   Current Outpatient Prescriptions on File Prior to Visit  Medication Sig Dispense Refill  . amLODipine (NORVASC) 5 MG tablet Take 1 tablet (5 mg total) by mouth daily.  30 tablet  0  . aspirin 81 MG tablet Take 81 mg by mouth daily. with meal       . clonazePAM (KLONOPIN) 0.5 MG tablet Take 1 tablet (0.5 mg total) by mouth 2 (two) times daily as needed for anxiety.  60 tablet  3  . donepezil (ARICEPT) 10 MG tablet Take 1 tablet (10 mg total) by mouth at bedtime.  30 tablet  11  . fexofenadine (ALLEGRA) 60 MG tablet Take 60 mg by mouth daily as needed.        . meloxicam (MOBIC) 15 MG tablet Take 15 mg by mouth daily as needed. with meal       . Multiple Vitamin (MULTI-VITAMIN PO) Take by mouth.        . pantoprazole (PROTONIX) 40 MG tablet Take 40 mg by mouth daily as needed. indigestion      . solifenacin (VESICARE) 5 MG tablet Take 1 tablet (5 mg total) by mouth daily.  30 tablet  5  .  travoprost, benzalkonium, (TRAVATAN) 0.004 % ophthalmic solution as directed.       . valsartan (DIOVAN) 80 MG tablet Take one and one half tablets by mouth every morning.  45 tablet  6      Review of Systems Review of Systems  Constitutional: Negative for fever, appetite change, fatigue and pos for weight change  Eyes: Negative for pain and visual disturbance.  Respiratory: Negative for cough and shortness of breath.   Cardiovascular: Negative for cp or palpitations    Gastrointestinal: Negative for nausea, diarrhea and constipation.  Genitourinary: Negative for urgency and frequency.  Skin: Negative for pallor or rash   Neurological: pos for generalized weakness and dizziness/ neg for focal symptoms  Hematological: Negative for adenopathy. Does not bruise/bleed easily.  Psychiatric/Behavioral: pos for depression- vegetative symptoms/ isolated, with mild anx/ no SI          Objective:   Physical Exam  Constitutional: She appears well-developed and well-nourished. No distress.       Frail appearing elderly female generally in good spirits   HENT:  Head: Normocephalic and atraumatic.  Mouth/Throat: Oropharynx is clear and moist.  Eyes: Conjunctivae and EOM are normal. Pupils are equal, round, and reactive to light. No scleral icterus.  Neck: Normal range of motion. Neck supple. No JVD present. Carotid bruit is not present. No thyromegaly present.  Cardiovascular: Normal rate, regular rhythm and normal heart sounds.  Exam reveals no gallop.   No murmur heard. Pulmonary/Chest: Effort normal and breath sounds normal. No respiratory distress. She has no wheezes.  Abdominal: Bowel sounds are normal. She exhibits no distension. There is no tenderness.  Musculoskeletal: She exhibits tenderness. She exhibits no edema.  Lymphadenopathy:    She has no cervical adenopathy.  Neurological: She is alert. She has normal reflexes. She displays no tremor. No cranial nerve deficit or sensory  deficit. She exhibits normal muscle tone. Coordination and gait abnormal.       Pt is ataxic Drifts back on rhomberg Non focal weakness- arms / LE - especially quads Cannot stand from a chair with arms unsupported Cannot walk without walker   Skin: Skin is warm and dry. No rash noted. No erythema. No pallor.  Psychiatric: Her  behavior is normal.       Quiet- timid but in fair spirits Poor short term memory-confuses easily Not tearful  Face is animated  Does answer most questions appropriately- but says "I don't know" to most  Seems content and not distressed          Assessment & Plan:

## 2011-02-21 NOTE — Patient Instructions (Signed)
Go forward with the PT and OT referrals at your residence for balance and strength Try to get out and be more social when you are up to it  Use antivert (meclizine ) for dizziness as needed Increase zoloft to 2 pills once daily for depression See me back in 6-8 weeks

## 2011-02-22 ENCOUNTER — Telehealth: Payer: Self-pay

## 2011-02-22 NOTE — Telephone Encounter (Signed)
If she feels ok - that is fine - just keep me in the loop

## 2011-02-22 NOTE — Telephone Encounter (Signed)
Left v/m for AMy to call back.

## 2011-02-22 NOTE — Telephone Encounter (Signed)
Amy physical therapist at [facility] left v/m that pt's BP today was 114/50 (required to call physician if diastolic is below 60). Pt does not have any symptoms. Amy requested call back if any action needs to be taken.

## 2011-02-24 DIAGNOSIS — M6281 Muscle weakness (generalized): Secondary | ICD-10-CM

## 2011-02-24 DIAGNOSIS — Z5189 Encounter for other specified aftercare: Secondary | ICD-10-CM

## 2011-02-24 DIAGNOSIS — F03918 Unspecified dementia, unspecified severity, with other behavioral disturbance: Secondary | ICD-10-CM

## 2011-02-24 DIAGNOSIS — F0391 Unspecified dementia with behavioral disturbance: Secondary | ICD-10-CM

## 2011-02-24 DIAGNOSIS — R262 Difficulty in walking, not elsewhere classified: Secondary | ICD-10-CM

## 2011-02-24 NOTE — Telephone Encounter (Signed)
Amy notified as instructed by telephone. 

## 2011-02-28 ENCOUNTER — Observation Stay: Payer: Self-pay | Admitting: Internal Medicine

## 2011-02-28 LAB — URINALYSIS, COMPLETE
Bilirubin,UR: NEGATIVE
Ketone: NEGATIVE
Leukocyte Esterase: NEGATIVE
Ph: 5 (ref 4.5–8.0)
Protein: NEGATIVE
RBC,UR: 1 /HPF (ref 0–5)
Squamous Epithelial: 1
WBC UR: <1 /HPF (ref 0–5)

## 2011-02-28 LAB — COMPREHENSIVE METABOLIC PANEL
Anion Gap: 11 (ref 7–16)
Calcium, Total: 9.1 mg/dL (ref 8.5–10.1)
Chloride: 93 mmol/L — ABNORMAL LOW (ref 98–107)
EGFR (African American): 16 — ABNORMAL LOW
Potassium: 4.6 mmol/L (ref 3.5–5.1)
SGOT(AST): 24 U/L (ref 15–37)
SGPT (ALT): 16 U/L

## 2011-02-28 LAB — CBC
HCT: 31.4 % — ABNORMAL LOW (ref 35.0–47.0)
HGB: 10.1 g/dL — ABNORMAL LOW (ref 12.0–16.0)
MCHC: 32.3 g/dL (ref 32.0–36.0)
Platelet: 310 10*3/uL (ref 150–440)
RDW: 16.5 % — ABNORMAL HIGH (ref 11.5–14.5)
WBC: 7.5 10*3/uL (ref 3.6–11.0)

## 2011-02-28 LAB — CK TOTAL AND CKMB (NOT AT ARMC)
CK, Total: 27 U/L (ref 21–215)
CK-MB: 0.9 ng/mL (ref 0.5–3.6)

## 2011-02-28 NOTE — Assessment & Plan Note (Signed)
Chronic - unchanged  Pt is fearful to walk - doing fairly with walker Did ref to PT/ OT for help with mobility and strength

## 2011-02-28 NOTE — Assessment & Plan Note (Signed)
Likely from deconditioning  Ref to PT OT - for eval at residence since pt is homebound

## 2011-02-28 NOTE — Assessment & Plan Note (Signed)
Is fairly stable on current meds Suspect depression worsens the condition slightly Overall has adj to new change in living situation well

## 2011-02-28 NOTE — Assessment & Plan Note (Signed)
Vegetative symptoms lately  Has moved- adj fairly  Need to get her out and moving She is agreeable to inc her zoloft dose with f/u planned Will update if side eff- reviewed

## 2011-03-01 LAB — BASIC METABOLIC PANEL
BUN: 27 mg/dL — ABNORMAL HIGH (ref 7–18)
Calcium, Total: 8 mg/dL — ABNORMAL LOW (ref 8.5–10.1)
Chloride: 98 mmol/L (ref 98–107)
Co2: 27 mmol/L (ref 21–32)
Creatinine: 2 mg/dL — ABNORMAL HIGH (ref 0.60–1.30)
EGFR (Non-African Amer.): 25 — ABNORMAL LOW
Osmolality: 274 (ref 275–301)
Potassium: 4.2 mmol/L (ref 3.5–5.1)

## 2011-03-02 LAB — BASIC METABOLIC PANEL
Anion Gap: 10 (ref 7–16)
BUN: 13 mg/dL (ref 7–18)
Calcium, Total: 8.1 mg/dL — ABNORMAL LOW (ref 8.5–10.1)
Chloride: 107 mmol/L (ref 98–107)
Co2: 24 mmol/L (ref 21–32)
Creatinine: 0.8 mg/dL (ref 0.60–1.30)
Sodium: 141 mmol/L (ref 136–145)

## 2011-03-03 ENCOUNTER — Telehealth: Payer: Self-pay | Admitting: Family Medicine

## 2011-03-03 ENCOUNTER — Telehealth: Payer: Self-pay

## 2011-03-03 NOTE — Telephone Encounter (Signed)
Please go ahead and give verbal order to re start those

## 2011-03-03 NOTE — Telephone Encounter (Signed)
Aware- will call back/ f/u if not improving

## 2011-03-03 NOTE — Telephone Encounter (Signed)
Triage Record Num: [id number] Operator: Remonia Richter Patient Name: Courtney Castillo Call Date & Time: 03/02/2011 10:53:05PM Patient Phone: [phone] PCP: Patient Gender: Female PCP Fax : Patient DOB: 01/06/1928 Practice Name: [facility] Reason for Call: Caller: Sheila/Med Tech at [facility]; PCP: Roxy Manns A.; CB#: [phone]; Call regarding: Admitted to [facility] for observation for Dehydration due to nausea and vomiting and diag with Virus on 02/28/11, she was rehydrated and back tonight to facility this am, temp 100.1,157/65 tonight , Tylenol given at 22:30,taking fluids and soda,she has voided at [facility] denies any emergent s/s,Home care Advise given per fever Adult Guideline,staff to continue to encourage PO fluids, recheck temp ,may use Tylenol ordered PRN,call MD in am if low grade fever persists,patient diag with Virus when at [facility] perimeters gone over Protocol(s) Used: Fever - Adult Recommended Outcome per Protocol: Provide Home/Self Care Reason for Outcome: All other situations Care Advice: Normal body temperatures varies by person, age, activity, and time of day. It is an important part of the body's defense against infection. ~ It takes 20-60 minutes for fever reducing meds to work. Take your temperature 1-2 hours after taking one of these medications to check if they are working. ~ ~ HEALTH PROMOTION / MAINTENANCE ~ SYMPTOM / CONDITION MANAGEMENT ~ CAUTIONS Most adults need to drink 6-10 eight-ounce glasses (1.2-2.0 liters) of fluids per day unless previously told to limit fluid intake for other medical reasons. Limit fluids that contain caffeine, sugar or alcohol. Urine will be a very light yellow color when you drink enough fluids. ~ COMFORT MEASURES FOR A FEVER: - Drink cool liquids or eat ice chips or popsicles. Avoid drinks with alcohol or caffeine. - Wear one layer of light-weight clothing. - Consider using a fan to improve circulation. -  Rest until temperature returns to normal and other symptoms improve. - Use a lightweight blanket or other bedding. - A lukewarm (not cold) bath or shower can help lower body temperature. Cold water can cause shivering and raise temperature. If shivering starts, dry off and cover with lightweight clothing. ~ Fever-reducing medications can lower body temperature but are often not necessary unless your temperature is over 101 F (38.3 C) in healthy adults or over 100 F (37.7 C) in frail elderly, immunocompromised or pregnant individuals, or if you are uncomfortable. Take at least 2 doses as directed on label to see if this helps reduce your fever. ~ PREVENT INFECTIONS BY: - Drinking 6-10 eight ounce glasses of fluids (1.2 - 2.0 liters) each day. - Eat a balanced diet and get adequate rest. - Immediately wash cuts or abrasions with mild soap and water. - Cleanse skin not exposed to air each day to prevent irritation and remove bacteria that could cause infections. - Wash hands frequently; cover nose and mouth when sneezing or coughing. - See a dentist regularly and replace toothbrushes every 3 months. - Get age appropriate immunizations. ~ ~ Analgesic/Antipyretic Advice - Acetaminophen: 03/02/2011 11:09:57PM Page 1 of 2 [email or web address] [facility] Triage Call Report Patient Name: Deyonna Fitzsimmons continuation page/s Consider acetaminophen as directed on label or by pharmacist/provider for pain or fever PRECAUTIONS: - Use if there is no history of liver disease, alcoholism, or intake of three or more alcohol drinks per day - Only if approved by provider during pregnancy or when breastfeeding - During pregnancy, acetaminophen should not be taken more than 3 consecutive days without telling provider -S pDeoa kn owt iethx cyeoeudr rpercoovmidmere nads esdo odno saes opro fsrseiqbulee nifc:y -  any temperature elevation in a frail elderly or immunocompromised patient (such as diabetes, HIV/AIDS,  renal disease, chemotherapy, organ transplant, or chronic steroid use). - pregnant and temperature elevation of 100.5 F (38C) or above. - fever does not respond despite 2 doses of fever reducing medication. - fever responds to home care but persists for 3 days or more. ~ 03/02/2011 11:09:57PM Page 2 of 2 [email or web address]

## 2011-03-03 NOTE — Telephone Encounter (Signed)
Paige notified as instructed by telephone.

## 2011-03-03 NOTE — Telephone Encounter (Signed)
Paige at [facility] said pt had recently been in hospital 24-48 hrs for BP. Pt is back at home and needs order to restart Home health PT, OT, and nurse visits. Idalia Needle can be reached at [phone].

## 2011-03-07 ENCOUNTER — Telehealth: Payer: Self-pay

## 2011-03-07 NOTE — Telephone Encounter (Signed)
That sounds fine  thanks

## 2011-03-07 NOTE — Telephone Encounter (Signed)
Amy,physical therapist at [facility] wanted for FYI to let Dr Milinda Antis know she resumed care of pt on 03/04/11. Pt's frequency for PT will be 1 wk x1, 3 wk x 1 and 2 wk x 1. Will be working on strengthening and gait. If Dr Milinda Antis needs to contact Amy please call [phone].

## 2011-03-14 ENCOUNTER — Ambulatory Visit: Payer: Medicare Other | Admitting: Family Medicine

## 2011-03-14 DIAGNOSIS — Z0289 Encounter for other administrative examinations: Secondary | ICD-10-CM

## 2011-03-21 ENCOUNTER — Emergency Department: Payer: Self-pay | Admitting: *Deleted

## 2011-04-04 ENCOUNTER — Ambulatory Visit: Payer: Medicare Other | Admitting: Family Medicine

## 2011-04-06 ENCOUNTER — Encounter: Payer: Self-pay | Admitting: Family Medicine

## 2011-04-06 DIAGNOSIS — N3281 Overactive bladder: Secondary | ICD-10-CM | POA: Insufficient documentation

## 2011-04-11 ENCOUNTER — Telehealth: Payer: Self-pay | Admitting: *Deleted

## 2011-04-11 MED ORDER — TRAMADOL HCL 50 MG PO TABS: 50.0000 mg | ORAL_TABLET | Freq: Three times a day (TID) | ORAL | Status: DC

## 2011-04-11 NOTE — Telephone Encounter (Signed)
Received prescriber authorization form for Tramadol. Form in your IN box.

## 2011-04-11 NOTE — Telephone Encounter (Signed)
Rx faxed to [facility] at [phone].

## 2011-04-11 NOTE — Telephone Encounter (Signed)
Done in IN box 

## 2011-04-13 ENCOUNTER — Encounter: Payer: Self-pay | Admitting: Family Medicine

## 2011-04-13 ENCOUNTER — Ambulatory Visit: Payer: Medicare Other | Admitting: Family Medicine

## 2011-04-13 ENCOUNTER — Ambulatory Visit (INDEPENDENT_AMBULATORY_CARE_PROVIDER_SITE_OTHER): Payer: Medicare Other | Admitting: Family Medicine

## 2011-04-13 VITALS — BP 136/72 | HR 93 | Temp 97.6°F | Ht 60.0 in | Wt 124.8 lb

## 2011-04-13 DIAGNOSIS — F329 Major depressive disorder, single episode, unspecified: Secondary | ICD-10-CM

## 2011-04-13 DIAGNOSIS — F0390 Unspecified dementia without behavioral disturbance: Secondary | ICD-10-CM

## 2011-04-13 DIAGNOSIS — E119 Type 2 diabetes mellitus without complications: Secondary | ICD-10-CM

## 2011-04-13 DIAGNOSIS — M6281 Muscle weakness (generalized): Secondary | ICD-10-CM

## 2011-04-13 NOTE — Assessment & Plan Note (Signed)
Improved with increase in zoloft  (no side eff) Appetite is good -no further wt loss  More relaxed and more active and social  Disc imp of staying social - not to isolate herself  Will continue to follow F/u this summer

## 2011-04-13 NOTE — Assessment & Plan Note (Addendum)
Lab Results  Component Value Date   HGBA1C 6.3* 12/25/2010   is eating better now Will check in summer before f/u

## 2011-04-13 NOTE — Patient Instructions (Signed)
I am glad you are doing well  No change in medicine Try to keep being active and social as much as possible  Don't skip meals - your weight is stable and good  Schedule follow up with me in July with labs prior

## 2011-04-13 NOTE — Assessment & Plan Note (Signed)
I see no major changes today- is generally mentally sharp in conversation - admits to being frustrated over short term memory loss  No change in aricept - is tolerating well Depression is improved  Also getting used to new routine at [facility]

## 2011-04-13 NOTE — Progress Notes (Signed)
Subjective:    Patient ID: Courtney Castillo, female    DOB: Feb 25, 1927, 78 y.o.   MRN: [id number]  HPI Here for f/u of depression and dementia and deconditioning/ mobility issues  Thinks she is feeling so / so overall   Wt is stable Is eating well - the food is ok - is not missing meals / eats what they fix for her  Still at [facility] senior living-- she really likes it there -- says they treat her well   Is meeting some people - happy with that  She had a field trip earlier this week  Is good to get out once in a while   Memory is about the same  Gets frustrated by short term memory loss , but feels safer in her new environment  Is trying to stay active    Last visit inc zoloft for depression and isolative behavior She has not noticed much difference - ( I notice more independent and social)  No tearfulness, and admits is less likely to isolate herself    Last visit ref for PT / OT for gait disorder and deconditioning (had recently been hosp) Does feel stonger than she was  Uses walker when outside of the house unless she feels unsteady  Dizziness is the same as it ever was - no better and no worse  Is more confident due to strength, however   Patient Active Problem List  Diagnoses  . DIABETES MELLITUS, TYPE II  . HYPERLIPIDEMIA  . ANEMIA  . CATARACTS  . HYPERTENSION  . ALLERGIC RHINITIS  . REACTIVE AIRWAY DISEASE  . GERD  . DIVERTICULOSIS, COLON  . OSTEOARTHRITIS  . ARTHROPATHY NOS, MULTIPLE SITES  . POLYMYALGIA RHEUMATICA  . DIZZINESS  . Dementia  . Hx Breast cancer (IDC) Right, Stage one  . HX, PERSONAL, ARTHRITIS  . Chest wall pain  . Breast cancer  . Back pain  . Knee pain  . Pain in both knees  . Muscle weakness (generalized)  . Depression (emotion)  . Overactive bladder   Past Medical History  Diagnosis Date  . Diabetes mellitus     type II  . Diverticulosis of colon   . Hyperlipidemia   . Hypertension   . Arthritis     osteoarthritis knees  .  GERD (gastroesophageal reflux disease)     hiatal hernia  . Allergy     allergic rhinitis/ vasometer  . Lumbar spinal stenosis 05/2007  . 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 Anemia   . Cancer     Hx of breast CA & recurrent breast CA 11/11  . Dementia 2012  . Anxiety    Past Surgical History  Procedure Date  . Carpal tunnel release   . Eye surgery     cataract extraction  . Abdominal hysterectomy 03/1997    BSO stage 1B adenocarcinoma of endometruim  . Mastectomy w/ nodes partial 12/14/2009    IDC Right, on  . Incisional breast biopsy 10/16/2009  . Shoulder surgery   . Back surgery 2009  . Tympanoplasty 1996  . Mastectomy partial / lumpectomy 12/28/1998    NL lumpectomy  . Mastectomy partial / lumpectomy 01/07/2010    Re-excision for + margin   History  Substance Use Topics  . Smoking status: Never Smoker   . Smokeless tobacco: Never Used  . Alcohol Use: No   Family History  Problem Relation Age of Onset  .  Diabetes Mother   . Heart disease Mother     MI  . Kidney disease Father   . Hypertension Father    Allergies  Allergen Reactions  . Sulfamethoxazole W/Trimethoprim     REACTION: Unknown reaction it has been years since patient took it and she only knows it was a Sulfa drug she took.   Current Outpatient Prescriptions on File Prior to Visit  Medication Sig Dispense Refill  . acetaminophen (TYLENOL) 325 MG tablet Take 650 mg by mouth every 6 (six) hours as needed.      Marland Kitchen amLODipine (NORVASC) 5 MG tablet Take 1 tablet (5 mg total) by mouth daily.  30 tablet  0  . aspirin 81 MG tablet Take 81 mg by mouth daily. with meal       . Calcium-Vitamin A-Vitamin D (LIQUID CALCIUM PO) Take 120 mLs by mouth 2 (two) times daily.      Marland Kitchen donepezil (ARICEPT) 10 MG tablet Take 1 tablet (10 mg total) by mouth at bedtime.  30 tablet  11  . fexofenadine (ALLEGRA) 60 MG tablet Take 60 mg by mouth daily as needed.        . meclizine  (ANTIVERT) 25 MG tablet Take 0.5 tablets (12.5 mg total) by mouth 2 (two) times daily as needed for dizziness.  30 tablet  5  . meloxicam (MOBIC) 15 MG tablet Take 15 mg by mouth daily as needed. with meal       . Multiple Vitamin (MULTI-VITAMIN PO) Take by mouth.        . pantoprazole (PROTONIX) 40 MG tablet Take 40 mg by mouth daily as needed. indigestion      . sertraline (ZOLOFT) 25 MG tablet Take 2 tablets (50 mg total) by mouth daily.  60 tablet  11  . solifenacin (VESICARE) 5 MG tablet Take 1 tablet (5 mg total) by mouth daily.  30 tablet  5  . traMADol (ULTRAM) 50 MG tablet Take 1 tablet (50 mg total) by mouth 3 (three) times daily.  90 tablet  11  . travoprost, benzalkonium, (TRAVATAN) 0.004 % ophthalmic solution as directed.       . valsartan (DIOVAN) 80 MG tablet Take one and one half tablets by mouth every morning.  45 tablet  6     Review of Systems Review of Systems  Constitutional: Negative for fever, appetite change, fatigue and unexpected weight change.  Eyes: Negative for pain and visual disturbance.  Respiratory: Negative for cough and shortness of breath.   Cardiovascular: Negative for cp or palpitations    Gastrointestinal: Negative for nausea, diarrhea and constipation.  Genitourinary: Negative for urgency and frequency.  Skin: Negative for pallor or rash   Neurological: Negative for weakness,  numbness and headaches. pos for chronic dizziness Hematological: Negative for adenopathy. Does not bruise/bleed easily.  Psychiatric/Behavioral: pos for depression/ anx that is improved         Objective:   Physical Exam  Constitutional: She appears well-developed and well-nourished. No distress.       Frail appearing elderly female   HENT:  Head: Normocephalic and atraumatic.  Mouth/Throat: Oropharynx is clear and moist.  Eyes: Conjunctivae and EOM are normal. Pupils are equal, round, and reactive to light. No scleral icterus.  Neck: Normal range of motion. Neck  supple. No JVD present. Carotid bruit is not present. No thyromegaly present.  Cardiovascular: Normal rate, regular rhythm, normal heart sounds and intact distal pulses.  Exam reveals no gallop.   Pulmonary/Chest: Effort  normal and breath sounds normal. No respiratory distress. She has no wheezes.  Abdominal: Soft. Bowel sounds are normal. She exhibits no distension, no abdominal bruit and no mass. There is no tenderness.  Musculoskeletal: She exhibits no edema and no tenderness.  Lymphadenopathy:    She has no cervical adenopathy.  Neurological: She is alert. She has normal reflexes. No cranial nerve deficit. She exhibits normal muscle tone.       Slow gait - is steady today Pt ambulates well with walker Can rise from chair unassisted and seems stronger   Skin: Skin is warm and dry. No rash noted. No erythema. No pallor.  Psychiatric: She has a normal mood and affect.       Cheerful today Talkative Good eye contact and comm skills          Assessment & Plan:

## 2011-04-13 NOTE — Assessment & Plan Note (Signed)
Improved after PT and OT  Able to ambulate well today with walker and rise from chair unassisted Dizziness is chronic - but has had some vestib training to prevent falls  Will continue to monitor

## 2011-05-09 ENCOUNTER — Other Ambulatory Visit: Payer: Self-pay | Admitting: *Deleted

## 2011-05-09 NOTE — Telephone Encounter (Signed)
Received faxed refill from pharmacy for Aspirin, Tab-A-Vite Multivitamin, Amlodipine and Diovan. Pharmacy also requested a refill on Diovan 80 mg, this is not on medication. Forms are in your in box.

## 2011-05-09 NOTE — Telephone Encounter (Signed)
I found all on her med list  I did the paper part - in IN box Please note refils in chart-thanks

## 2011-05-10 ENCOUNTER — Other Ambulatory Visit: Payer: Self-pay | Admitting: *Deleted

## 2011-05-10 MED ORDER — ONE-DAILY MULTI VITAMINS PO TABS: 1.0000 | ORAL_TABLET | Freq: Every day | ORAL | Status: AC

## 2011-05-10 MED ORDER — AMLODIPINE BESYLATE 5 MG PO TABS: 5.0000 mg | ORAL_TABLET | Freq: Every day | ORAL | Status: DC

## 2011-05-10 MED ORDER — VALSARTAN 80 MG PO TABS: ORAL_TABLET | ORAL | Status: DC

## 2011-05-10 MED ORDER — ASPIRIN 81 MG PO TABS: 81.0000 mg | ORAL_TABLET | Freq: Every day | ORAL | Status: DC

## 2011-05-10 MED ORDER — ASPIRIN 81 MG PO TABS: 81.0000 mg | ORAL_TABLET | Freq: Every day | ORAL | Status: AC

## 2011-05-10 MED ORDER — ONE-DAILY MULTI VITAMINS PO TABS: 1.0000 | ORAL_TABLET | Freq: Every day | ORAL | Status: DC

## 2011-05-10 NOTE — Telephone Encounter (Signed)
Rx's were sent to [facility] by mistake, called [facility] and cancelled the Rxs.  Rx's sent to [facility] via fax.

## 2011-06-02 ENCOUNTER — Emergency Department: Payer: Self-pay | Admitting: *Deleted

## 2011-06-02 LAB — URINALYSIS, COMPLETE
Bilirubin,UR: NEGATIVE
Blood: NEGATIVE
Ketone: NEGATIVE
Nitrite: NEGATIVE
Ph: 7 (ref 4.5–8.0)
RBC,UR: <1 /HPF (ref 0–5)
Specific Gravity: 1.004 (ref 1.003–1.030)
Squamous Epithelial: 1

## 2011-06-02 LAB — CBC
HCT: 29.3 % — ABNORMAL LOW (ref 35.0–47.0)
HGB: 9.7 g/dL — ABNORMAL LOW (ref 12.0–16.0)
MCH: 27.8 pg (ref 26.0–34.0)
MCHC: 33.1 g/dL (ref 32.0–36.0)
MCV: 84 fL (ref 80–100)
WBC: 8.2 10*3/uL (ref 3.6–11.0)

## 2011-06-02 LAB — COMPREHENSIVE METABOLIC PANEL
Albumin: 3.1 g/dL — ABNORMAL LOW (ref 3.4–5.0)
Anion Gap: 5 — ABNORMAL LOW (ref 7–16)
BUN: 17 mg/dL (ref 7–18)
Calcium, Total: 9 mg/dL (ref 8.5–10.1)
Co2: 29 mmol/L (ref 21–32)
EGFR (African American): >60
EGFR (Non-African Amer.): >60
Glucose: 102 mg/dL — ABNORMAL HIGH (ref 65–99)
Osmolality: 276 (ref 275–301)
Potassium: 4.1 mmol/L (ref 3.5–5.1)
SGOT(AST): 27 U/L (ref 15–37)
SGPT (ALT): 21 U/L

## 2011-06-02 LAB — TROPONIN I: Troponin-I: <0.02 ng/mL

## 2011-07-05 ENCOUNTER — Telehealth: Payer: Self-pay | Admitting: *Deleted

## 2011-07-05 MED ORDER — ENSURE IMMUNE HEALTH PO LIQD: ORAL | Status: DC

## 2011-07-05 NOTE — Telephone Encounter (Signed)
ENSURE IMM HEALTH CHOC 0.04G-1.05 LIQUID Sig: Drink the entire contents of 237 MLs [1-can] PO daily via [facility] [for patient's residence at [facility]] Done.

## 2011-07-11 ENCOUNTER — Other Ambulatory Visit: Payer: Self-pay | Admitting: *Deleted

## 2011-07-11 MED ORDER — TRAVOPROST 0.004 % OP SOLN: 1.0000 [drp] | Freq: Two times a day (BID) | OPHTHALMIC | Status: DC

## 2011-07-21 ENCOUNTER — Emergency Department: Payer: Self-pay | Admitting: Emergency Medicine

## 2011-08-01 ENCOUNTER — Telehealth: Payer: Self-pay | Admitting: Family Medicine

## 2011-08-01 NOTE — Telephone Encounter (Signed)
Order was placed up front to be picked up.

## 2011-08-01 NOTE — Telephone Encounter (Signed)
Courtney RN at [facility] to see if orders for pt's lab work due 08/02/11 can be faxed to them, sent out and then they can call the results to the ofc.  They do not transport pt's just for lab work, only for Dr's appts.   Their # is [phone] and the fax # in  [phone].  Please advise.

## 2011-08-01 NOTE — Telephone Encounter (Signed)
Order is on px in IN box to fax, thanks

## 2011-08-02 ENCOUNTER — Telehealth: Payer: Self-pay | Admitting: Family Medicine

## 2011-08-02 ENCOUNTER — Other Ambulatory Visit: Payer: Medicare Other

## 2011-08-02 NOTE — Telephone Encounter (Signed)
We decided to have her labs sent out to be drawn by facility she lives in  Thanks - can cancel the lab appt

## 2011-08-02 NOTE — Telephone Encounter (Signed)
Message copied by Judy Pimple on Tue Aug 02, 2011  8:03 AM ------      Message from: Alvina Chou      Created: Wed Jul 27, 2011  3:48 PM      Regarding: Lab orders for Tues,7-16.13       Labs for a f/u appt

## 2011-08-09 ENCOUNTER — Ambulatory Visit: Payer: Medicare Other | Admitting: Family Medicine

## 2011-08-10 ENCOUNTER — Encounter: Payer: Self-pay | Admitting: Family Medicine

## 2011-08-10 ENCOUNTER — Ambulatory Visit (INDEPENDENT_AMBULATORY_CARE_PROVIDER_SITE_OTHER): Payer: Medicare Other | Admitting: Family Medicine

## 2011-08-10 VITALS — BP 128/60 | HR 95 | Temp 97.7°F | Ht 60.0 in | Wt 122.5 lb

## 2011-08-10 DIAGNOSIS — E119 Type 2 diabetes mellitus without complications: Secondary | ICD-10-CM

## 2011-08-10 DIAGNOSIS — F329 Major depressive disorder, single episode, unspecified: Secondary | ICD-10-CM

## 2011-08-10 DIAGNOSIS — I10 Essential (primary) hypertension: Secondary | ICD-10-CM

## 2011-08-10 DIAGNOSIS — F0390 Unspecified dementia without behavioral disturbance: Secondary | ICD-10-CM

## 2011-08-10 DIAGNOSIS — D649 Anemia, unspecified: Secondary | ICD-10-CM

## 2011-08-10 LAB — CBC WITH DIFFERENTIAL/PLATELET
Basophils Absolute: 0.1 10*3/uL (ref 0.0–0.1)
Eosinophils Relative: 1.5 % (ref 0.0–5.0)
HCT: 34.2 % — ABNORMAL LOW (ref 36.0–46.0)
Hemoglobin: 11.3 g/dL — ABNORMAL LOW (ref 12.0–15.0)
Lymphs Abs: 1.9 10*3/uL (ref 0.7–4.0)
MCV: 86.7 fl (ref 78.0–100.0)
Monocytes Absolute: 0.7 10*3/uL (ref 0.1–1.0)
Monocytes Relative: 8.3 % (ref 3.0–12.0)
Neutro Abs: 5.5 10*3/uL (ref 1.4–7.7)
Platelets: 258 10*3/uL (ref 150.0–400.0)
RDW: 17.7 % — ABNORMAL HIGH (ref 11.5–14.6)

## 2011-08-10 LAB — COMPREHENSIVE METABOLIC PANEL
ALT: 17 U/L (ref 0–35)
AST: 25 U/L (ref 0–37)
Albumin: 3.7 g/dL (ref 3.5–5.2)
BUN: 22 mg/dL (ref 6–23)
CO2: 27 mEq/L (ref 19–32)
Calcium: 9.4 mg/dL (ref 8.4–10.5)
Sodium: 138 mEq/L (ref 135–145)

## 2011-08-10 NOTE — Assessment & Plan Note (Signed)
zoloft has helped significantly-as has her new residence/ feeling of security and social interaction  Will continue this

## 2011-08-10 NOTE — Assessment & Plan Note (Signed)
a1c today- expect stable Some wt loss with transition to senior living- but pt says she is not missing meals  Disc low glycemic diet and staying active

## 2011-08-10 NOTE — Assessment & Plan Note (Signed)
Recheck cbc today.   

## 2011-08-10 NOTE — Patient Instructions (Addendum)
I'm glad you are doing well  Labs today No change in medicines Follow up in 6 months for annual exam with labs prior  Keep your brain and body active and stay social

## 2011-08-10 NOTE — Assessment & Plan Note (Signed)
bp in fair control at this time  No changes needed  Disc lifstyle change with low sodium diet and exercise  Lab today 

## 2011-08-10 NOTE — Progress Notes (Signed)
Subjective:    Patient ID: Courtney Castillo, female    DOB: 15-Apr-1927, 78 y.o.   MRN: [id number]  HPI  Here for f/u of depression, dementia and other chronic conditions  Is feeling about the same   Has a cold sore today- and mouth is sore  Uses px ointment - it is working   bp is good    Today BP Readings from Last 3 Encounters:  08/10/11 128/60  04/13/11 136/72  02/21/11 128/72    No cp or palpitations or headaches or edema  No side effects to medicines      Chemistry      Component Value Date/Time   NA 135 12/26/2010 0347   K 4.3 12/26/2010 0347   CL 100 12/26/2010 0347   CO2 28 12/26/2010 0347   BUN 16 12/26/2010 0347   CREATININE 0.76 12/28/2010 0320      Component Value Date/Time   CALCIUM 9.1 12/26/2010 0347   ALKPHOS 72 12/03/2009 1540   AST 24 12/03/2009 1540   ALT 19 12/03/2009 1540   BILITOT 0.5 12/03/2009 1540      Lab Results  Component Value Date   ALT 19 12/03/2009   AST 24 12/03/2009   ALKPHOS 72 12/03/2009   BILITOT 0.5 12/03/2009     Lab Results  Component Value Date   HGBA1C 6.3* 12/25/2010   diet is fair   On aricept Is about the same  No better or worse   Depression- on zoloft Is smiling today  Overall thinks the zoloft is helping a lot  Is able to enjoy some things now and feels more relaxed   Chronic pain - deals with it on a daily basis  Uses a walker all the time No falls, is careful    Wt is down 2 lb Pt eats all her meals   Living at [facility]- is good , she thinks she is well taken care of  Food is good overall  Is making some friends - that is nice  Did an outing to the city park one day- that was nice  Can get outdoors when she wants to also  Patient Active Problem List  Diagnosis  . DIABETES MELLITUS, TYPE II  . HYPERLIPIDEMIA  . ANEMIA  . CATARACTS  . HYPERTENSION  . ALLERGIC RHINITIS  . REACTIVE AIRWAY DISEASE  . GERD  . DIVERTICULOSIS, COLON  . OSTEOARTHRITIS  . ARTHROPATHY NOS, MULTIPLE SITES    . POLYMYALGIA RHEUMATICA  . DIZZINESS  . Dementia  . Hx Breast cancer (IDC) Right, Stage one  . HX, PERSONAL, ARTHRITIS  . Chest wall pain  . Breast cancer  . Back pain  . Knee pain  . Pain in both knees  . Muscle weakness (generalized)  . Depression (emotion)  . Overactive bladder   Past Medical History  Diagnosis Date  . Diabetes mellitus     type II  . Diverticulosis of colon   . Hyperlipidemia   . Hypertension   . Arthritis     osteoarthritis knees  . GERD (gastroesophageal reflux disease)     hiatal hernia  . Allergy     allergic rhinitis/ vasometer  . Lumbar spinal stenosis 05/2007  . 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 Anemia   . Cancer     Hx of breast CA & recurrent breast CA 11/11  . Dementia 2012  . Anxiety  Past Surgical History  Procedure Date  . Carpal tunnel release   . Eye surgery     cataract extraction  . Abdominal hysterectomy 03/1997    BSO stage 1B adenocarcinoma of endometruim  . Mastectomy w/ nodes partial 12/14/2009    IDC Right, on  . Incisional breast biopsy 10/16/2009  . Shoulder surgery   . Back surgery 2009  . Tympanoplasty 1996  . Mastectomy partial / lumpectomy 12/28/1998    NL lumpectomy  . Mastectomy partial / lumpectomy 01/07/2010    Re-excision for + margin   History  Substance Use Topics  . Smoking status: Never Smoker   . Smokeless tobacco: Never Used  . Alcohol Use: No   Family History  Problem Relation Age of Onset  . Diabetes Mother   . Heart disease Mother     MI  . Kidney disease Father   . Hypertension Father    Allergies  Allergen Reactions  . Sulfamethoxazole W-Trimethoprim     REACTION: Unknown reaction it has been years since patient took it and she only knows it was a Sulfa drug she took.   Current Outpatient Prescriptions on File Prior to Visit  Medication Sig Dispense Refill  . acetaminophen (TYLENOL) 325 MG tablet Take 650 mg by mouth every 6  (six) hours as needed.      Marland Kitchen amLODipine (NORVASC) 5 MG tablet Take 1 tablet (5 mg total) by mouth daily.  30 tablet  11  . aspirin 81 MG tablet Take 1 tablet (81 mg total) by mouth daily. with meal  30 tablet  11  . Calcium-Vitamin A-Vitamin D (LIQUID CALCIUM PO) Take 120 mLs by mouth 2 (two) times daily.      Marland Kitchen donepezil (ARICEPT) 10 MG tablet Take 1 tablet (10 mg total) by mouth at bedtime.  30 tablet  11  . feeding supplement (ENSURE IMMUNE HEALTH) LIQD Drink entire contents of 237 MLs [1-can] by mouth twice daily.  60 Bottle  11  . fexofenadine (ALLEGRA) 60 MG tablet Take 60 mg by mouth daily as needed.        . meclizine (ANTIVERT) 25 MG tablet Take 0.5 tablets (12.5 mg total) by mouth 2 (two) times daily as needed for dizziness.  30 tablet  5  . meloxicam (MOBIC) 15 MG tablet Take 15 mg by mouth daily as needed. with meal       . Multiple Vitamin (MULTI-VITAMIN PO) Take by mouth.        . Multiple Vitamin (MULTIVITAMIN) tablet Take 1 tablet by mouth daily.  30 tablet  11  . pantoprazole (PROTONIX) 40 MG tablet Take 40 mg by mouth daily as needed. indigestion      . sertraline (ZOLOFT) 25 MG tablet Take 2 tablets (50 mg total) by mouth daily.  60 tablet  11  . solifenacin (VESICARE) 5 MG tablet Take 1 tablet (5 mg total) by mouth daily.  30 tablet  5  . traMADol (ULTRAM) 50 MG tablet Take 1 tablet (50 mg total) by mouth 3 (three) times daily.  90 tablet  11  . travoprost, benzalkonium, (TRAVATAN) 0.004 % ophthalmic solution Place 1 drop into both eyes 2 (two) times daily.  2.5 mL  5  . valsartan (DIOVAN) 80 MG tablet Take one and one half tablets by mouth every morning.  45 tablet  11    Review of Systems Review of Systems  Constitutional: Negative for fever, appetite change, fatigue and unexpected weight change.  Eyes: Negative for  pain and visual disturbance.  Respiratory: Negative for cough and shortness of breath.   Cardiovascular: Negative for cp or palpitations      Gastrointestinal: Negative for nausea, diarrhea and constipation.  Genitourinary: Negative for urgency and frequency.  Skin: Negative for pallor or rash   MSK pos for chonic back and leg pain  Neurological: Negative for weakness, light-headedness, numbness and headaches.  Hematological: Negative for adenopathy. Does not bruise/bleed easily.  Psychiatric/Behavioral:pos for improved depression and anxiety       Objective:   Physical Exam  Constitutional: She appears well-developed and well-nourished. No distress.       Frail appearing elderly female, able to ambulate with a walker  HENT:  Head: Normocephalic and atraumatic.  Mouth/Throat: Oropharynx is clear and moist.  Eyes: Conjunctivae and EOM are normal. Pupils are equal, round, and reactive to light. No scleral icterus.  Neck: Normal range of motion. Neck supple. No JVD present. Carotid bruit is not present. No thyromegaly present.  Cardiovascular: Normal rate, regular rhythm, normal heart sounds and intact distal pulses.  Exam reveals no gallop.   Pulmonary/Chest: Effort normal and breath sounds normal. No respiratory distress. She has no wheezes.  Abdominal: Soft. Bowel sounds are normal. She exhibits no distension, no abdominal bruit and no mass. There is no tenderness.  Musculoskeletal: She exhibits no edema.       Poor rom of LS baseline Some changes of OA in peripheral joints   Lymphadenopathy:    She has no cervical adenopathy.  Neurological: She is alert. She has normal reflexes. No cranial nerve deficit. She exhibits normal muscle tone. Coordination normal.  Skin: Skin is warm and dry. No rash noted. No erythema. No pallor.  Psychiatric: She has a normal mood and affect. Her speech is normal and behavior is normal. Judgment and thought content normal. Her mood appears not anxious. Her affect is not blunt and not labile. She does not exhibit a depressed mood. She exhibits abnormal recent memory.       Smiling and more  relaxed today than previous visits  Short term memory seems relatively stable          Assessment & Plan:

## 2011-08-10 NOTE — Assessment & Plan Note (Signed)
This seems stable with aricept -per pt she is dealing with the short term memory loss well , and assisted living has helped out a lot with her care and sense of security  Will continue to monitor F/u 6 mo

## 2011-08-15 ENCOUNTER — Telehealth: Payer: Self-pay | Admitting: Family Medicine

## 2011-08-15 NOTE — Telephone Encounter (Signed)
Spoke with [place] at [facility].  Pt has appt to see Dr. Reece Agar on Wednesday because her POA cant bring her tomorrow and the facility only provides transportation on Wednesdays and fridays.  Edmon Crape said she will call back tomorrow morning if her mouth is worse.

## 2011-08-15 NOTE — Telephone Encounter (Signed)
Please make sure pt is scheduled in office tomorrow with any provider.

## 2011-08-15 NOTE — Telephone Encounter (Signed)
Noted! Thank you

## 2011-08-15 NOTE — Telephone Encounter (Signed)
Patient Name: Courtney Castillo Call Date & Time: 08/14/2011 8:41:05PM Patient Phone: [phone] PCP: Patient Gender: Female PCP Fax : Patient DOB: [birth year] Practice Name: [facility] Reason for Call: Caller:Jana Rudd, Med Tech from [facility]; PCP: Milinda Antis, Orbisonia A.; CB#: [phone]; Call regarding Gums split open; has been applying cream for psoriasis clobetasol proponate on her mouth. No idea how long she has been applying this cream to her gums. Reports changes in mental status @ 1830; acted suspicious, confused, angry which is not usual for this patient. Other emergent sx ruled out. Poisoning protocol used. Advised to send patient to ED for evaluation related to Poisoning. Caller transferred to [employer]. Protocol(s) Used: Poisoning Recommended Outcome per Protocol: Activate EMS 911 Override Outcome if Used in Protocol: See ED Immediately RN Reason for Override Outcome: Nursing Judgement Used. Reason for Outcome: Sudden change in mental status Care Advice: ~ Protect the patient from falling or other harm. ~ Do not induce vomiting. ~ Do not give the patient anything to eat or drink. ~ An adult should stay with the patient, preferably one trained in CPR. ~ After calling EMS 911, contact [facility]) [phone]) Write down provider's name. List or place the following in a bag for transport with the patient: current prescription and/or nonprescription medications; alternative treatments, therapies and medications; and street drugs.

## 2011-08-15 NOTE — Telephone Encounter (Signed)
Caller: Surveyor, minerals; PCP: Tower, Marne A.; CB#: [phone]; Call regarding Mouth Ulcers; She applied eczema cream  to her gums and Poison Control was contacted on 08/14/11 - they were advised to have patient seen in office this date.  Called office and was told that no appointments are available until 7/31 @ 0900.  Checked for cancellations and no appointments available.  Information noted and sent ot office for follow up per PCP Calls protocol.

## 2011-08-17 ENCOUNTER — Ambulatory Visit (INDEPENDENT_AMBULATORY_CARE_PROVIDER_SITE_OTHER): Payer: Medicare Other | Admitting: Family Medicine

## 2011-08-17 ENCOUNTER — Encounter: Payer: Self-pay | Admitting: Family Medicine

## 2011-08-17 VITALS — BP 138/82 | HR 84 | Temp 98.1°F | Wt 123.5 lb

## 2011-08-17 DIAGNOSIS — K120 Recurrent oral aphthae: Secondary | ICD-10-CM

## 2011-08-17 DIAGNOSIS — S01502A Unspecified open wound of oral cavity, initial encounter: Secondary | ICD-10-CM

## 2011-08-17 DIAGNOSIS — S01512A Laceration without foreign body of oral cavity, initial encounter: Secondary | ICD-10-CM | POA: Insufficient documentation

## 2011-08-17 MED ORDER — MAGIC MOUTHWASH: 5.0000 mL | Freq: Three times a day (TID) | ORAL | Status: DC

## 2011-08-17 NOTE — Assessment & Plan Note (Signed)
Doubt herpetic as not erythematous. Treat with magic mouthwash.  No lidocaine.

## 2011-08-17 NOTE — Progress Notes (Signed)
  Subjective:    Patient ID: Courtney Castillo, female    DOB: August 09, 1927, 78 y.o.   MRN: [id number]  HPI CC: mouth ulcer  Pleasant 84 you pt of Dr. Royden Purl with h/o dementia presents today with concern of mouth ulcer.  Present for last several weeks.  Feels spreading.  Very uncomfortable.  Has tried several different creams but doesn't remember them.    No other oral lesions, rashes, abd pain, n/v, fevers/chills, joint pain.  No throat swelling or trouble swallowing/breathing.  Denies recent viral illness or coughing, congestion, sneezing, ear pain, ST.  Wears dentures top and bottom.  No change in this recently either.  Has not recently seen dentist.  using same dentures for years.  Seen last week by PCP, no med changes then.  Denies any new meds recently.  Past Medical History  Diagnosis Date  . Diabetes mellitus     type II  . Diverticulosis of colon   . Hyperlipidemia   . Hypertension   . Arthritis     osteoarthritis knees  . GERD (gastroesophageal reflux disease)     hiatal hernia  . Allergy     allergic rhinitis/ vasometer  . Lumbar spinal stenosis 05/2007  . 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 Anemia   . Cancer     Hx of breast CA & recurrent breast CA 11/11  . Dementia 2012  . Anxiety     Review of Systems Per HPI    Objective:   Physical Exam  Nursing note and vitals reviewed. Constitutional: She appears well-developed and well-nourished. No distress.  HENT:  Head: Normocephalic and atraumatic.  Right Ear: Hearing, tympanic membrane, external ear and ear canal normal.  Left Ear: Hearing, tympanic membrane, external ear and ear canal normal.  Mouth/Throat: Uvula is midline and mucous membranes are normal. She has dentures (upper and lower). No oropharyngeal exudate, posterior oropharyngeal edema or tonsillar abscesses.       nonerythematous pustules right posterior soft palate as well as lateral right lower gumline.     Large deep cut anterior inferior gumline present, seems chronic, no erythema or drainage.  Eyes: Conjunctivae and EOM are normal. Pupils are equal, round, and reactive to light. No scleral icterus.  Neck: Normal range of motion. Neck supple.  Cardiovascular: Normal rate, regular rhythm, normal heart sounds and intact distal pulses.   No murmur heard. Pulmonary/Chest: Effort normal and breath sounds normal. No respiratory distress. She has no wheezes. She has no rales.  Lymphadenopathy:    She has no cervical adenopathy.  Skin: Skin is warm and dry. No rash noted.       Assessment & Plan:

## 2011-08-17 NOTE — Patient Instructions (Addendum)
You do have a cut in the mouth, anterior gumline.  I think dentures are worsening this.  Stop wearing lower dentures - only wear to eat, then remove.   Use cream prescribed. I do recommend seeing dentist for this as well. If not improving, please let us know.

## 2011-08-17 NOTE — Assessment & Plan Note (Signed)
Anticipate due to poorly fitting dentures, rec see dentist for this.

## 2011-10-04 ENCOUNTER — Other Ambulatory Visit: Payer: Self-pay | Admitting: *Deleted

## 2011-10-04 MED ORDER — DONEPEZIL HCL 10 MG PO TABS: 10.0000 mg | ORAL_TABLET | Freq: Every day | ORAL | Status: DC

## 2011-11-17 ENCOUNTER — Telehealth: Payer: Self-pay | Admitting: Family Medicine

## 2011-11-17 NOTE — Telephone Encounter (Signed)
[facility] Triage Call Report Triage Record Num: [id number] Operator: Rebeca Allegra Patient Name: Courtney Castillo Call Date & Time: 11/17/2011 1:00:25AM Patient Phone: [phone] PCP: Patient Gender: Female PCP Fax : Patient DOB: 1927/03/17 Practice Name: [facility] Reason for Call: Caller: Eugene Gavia; PCP: Roxy Manns [facility]); CB#: [phone]; Call regarding Fall; 11/17/11 0030. Pt reports attempting to get out of bed, slid off the bed and landed on the floor. Denies difficulty walking or standing. 11/17/11 0030: BP 151/73 HR 90 RR 18 T98.2 orally. All emergent symptoms ruled out per Falls protocol. Advised standard facility falls protocol precautions for the night. Advised Janel to call back with any changes in symptoms. Protocol(s) Used: Falls Recommended Outcome per Protocol: Provide Home/Self Care Reason for Outcome: All other situations Care Advice: ~ 11/17/2011 1:19:19AM Page 1 of 1 [email or web address]

## 2011-11-23 LAB — COMPREHENSIVE METABOLIC PANEL
Albumin: 3.4 g/dL (ref 3.4–5.0)
Alkaline Phosphatase: 83 U/L (ref 50–136)
BUN: 32 mg/dL — ABNORMAL HIGH (ref 7–18)
Calcium, Total: 8.9 mg/dL (ref 8.5–10.1)
Glucose: 174 mg/dL — ABNORMAL HIGH (ref 65–99)
SGOT(AST): 22 U/L (ref 15–37)
Sodium: 138 mmol/L (ref 136–145)
Total Protein: 8.8 g/dL — ABNORMAL HIGH (ref 6.4–8.2)

## 2011-11-23 LAB — URINALYSIS, COMPLETE
Bilirubin,UR: NEGATIVE
Blood: NEGATIVE
Ketone: NEGATIVE
Leukocyte Esterase: NEGATIVE
Ph: 5 (ref 4.5–8.0)
Protein: 30
RBC,UR: 1 /HPF (ref 0–5)
Squamous Epithelial: <1
WBC UR: 1 /HPF (ref 0–5)

## 2011-11-23 LAB — CBC
MCHC: 32.9 g/dL (ref 32.0–36.0)
Platelet: 246 10*3/uL (ref 150–440)
RDW: 14.7 % — ABNORMAL HIGH (ref 11.5–14.5)

## 2011-11-23 LAB — LIPASE, BLOOD: Lipase: 106 U/L (ref 73–393)

## 2011-11-24 ENCOUNTER — Inpatient Hospital Stay: Payer: Self-pay | Admitting: Internal Medicine

## 2011-11-24 LAB — TROPONIN I: Troponin-I: 0.03 ng/mL

## 2011-11-24 LAB — PRO B NATRIURETIC PEPTIDE: B-Type Natriuretic Peptide: 6175 pg/mL — ABNORMAL HIGH (ref 0–450)

## 2011-11-25 LAB — CBC WITH DIFFERENTIAL/PLATELET
Basophil #: 0.1 10*3/uL (ref 0.0–0.1)
HCT: 31.1 % — ABNORMAL LOW (ref 35.0–47.0)
Lymphocyte #: 2 10*3/uL (ref 1.0–3.6)
MCH: 29.7 pg (ref 26.0–34.0)
MCV: 89 fL (ref 80–100)
Monocyte #: 1.5 x10 3/mm — ABNORMAL HIGH (ref 0.2–0.9)
Neutrophil #: 8 10*3/uL — ABNORMAL HIGH (ref 1.4–6.5)
Neutrophil %: 68.8 %
Platelet: 259 10*3/uL (ref 150–440)
RBC: 3.51 10*6/uL — ABNORMAL LOW (ref 3.80–5.20)
RDW: 14.8 % — ABNORMAL HIGH (ref 11.5–14.5)
WBC: 11.6 10*3/uL — ABNORMAL HIGH (ref 3.6–11.0)

## 2011-11-29 ENCOUNTER — Telehealth: Payer: Self-pay

## 2011-11-29 NOTE — Telephone Encounter (Signed)
Automotive engineer of nursing [facility] received orders for pt to have home health PT upon discharge from [facility]. Wants to verify Dr Milinda Antis will approve orders and sign 485.Please advise.

## 2011-11-29 NOTE — Telephone Encounter (Signed)
That sounds fine - I just need her diagnosis so I know what I am referring for , and if they need a face to face encounter for medicare to pay... Then a doctor at [facility] will have to order it because I do not work in the hospital Thanks

## 2011-11-30 ENCOUNTER — Telehealth: Payer: Self-pay | Admitting: Family Medicine

## 2011-11-30 LAB — CULTURE, BLOOD (SINGLE)

## 2011-11-30 NOTE — Telephone Encounter (Signed)
Spoke with [name] and forward that info to Dr. Milinda Antis already

## 2011-11-30 NOTE — Telephone Encounter (Signed)
Spoke with Courtney Castillo and she said that the diagnosis would be difficulty walking, she does want a face to face encounter so once she is D/C from hospital they will call to set up follow up appt

## 2011-11-30 NOTE — Telephone Encounter (Signed)
[name]/Nursing Director at [facility] care needs an order for home health PT, Courtney Castillo is a resident at [facility]. Please call [name] back at [phone].

## 2011-11-30 NOTE — Telephone Encounter (Signed)
Ok, I will see her then, orders approved

## 2011-12-13 ENCOUNTER — Telehealth: Payer: Self-pay

## 2011-12-13 NOTE — Telephone Encounter (Signed)
That verbal order is fine, thanks

## 2011-12-13 NOTE — Telephone Encounter (Signed)
Courtney Castillo PT at [facility] request one time a week for one week,3 times a week for 2 weeks, then 2 times a week for 2 weeks.working on ambulation strengthening and gait. Request verbal order.Please advise.

## 2011-12-13 NOTE — Telephone Encounter (Signed)
Gave verbal order

## 2011-12-14 DIAGNOSIS — G8929 Other chronic pain: Secondary | ICD-10-CM

## 2011-12-14 DIAGNOSIS — R262 Difficulty in walking, not elsewhere classified: Secondary | ICD-10-CM

## 2011-12-14 DIAGNOSIS — M79609 Pain in unspecified limb: Secondary | ICD-10-CM

## 2011-12-14 DIAGNOSIS — IMO0001 Reserved for inherently not codable concepts without codable children: Secondary | ICD-10-CM

## 2011-12-26 ENCOUNTER — Other Ambulatory Visit: Payer: Self-pay | Admitting: *Deleted

## 2011-12-26 MED ORDER — ENSURE IMMUNE HEALTH PO LIQD: ORAL | Status: AC

## 2011-12-30 ENCOUNTER — Emergency Department: Payer: Self-pay | Admitting: Emergency Medicine

## 2012-01-02 ENCOUNTER — Telehealth: Payer: Self-pay | Admitting: Family Medicine

## 2012-01-02 NOTE — Telephone Encounter (Signed)
[place]

## 2012-01-02 NOTE — Telephone Encounter (Signed)
[facility] Triage Call Report Triage Record Num: [id number] Operator: Migdalia Dk Patient Name: Courtney Castillo Call Date & Time: 01/01/2012 7:47:08AM Patient Phone: [phone] PCP: Audrie Gallus. Tower Patient Gender: Female PCP Fax : Patient DOB: February 15, 1927 Practice Name: [place] [facility] Reason for Call: Caller: Tara/Resident Care Coordinator; PCP: Roxy Manns [facility]); CB#: [phone]; Call regarding fall. States patient fell around 0350 this a.m. Patient states she fell getting out of bed and hit back of head. No visible injury, no c/o pain. EMS was called since patient states she hit her head and patient refused to go to ED. Care Coordinator states she also called patients POA and was told if patient refused not to send her to ED. Alert and oriented with no visible injury other than abrasion to left upper arm. Advised will fax information to MD. Protocol(s) Used: Office Note Recommended Outcome per Protocol: Information Noted and Sent to Office Reason for Outcome: Caller information to office Care Advice: ~ 01/01/2012 7:54:26AM Page 1 of 1 [email or web address]

## 2012-01-04 ENCOUNTER — Encounter: Payer: Self-pay | Admitting: Family Medicine

## 2012-01-04 ENCOUNTER — Ambulatory Visit (INDEPENDENT_AMBULATORY_CARE_PROVIDER_SITE_OTHER): Payer: Medicare Other | Admitting: Family Medicine

## 2012-01-04 VITALS — BP 122/54 | HR 90 | Temp 98.3°F | Ht 60.0 in | Wt 138.2 lb

## 2012-01-04 DIAGNOSIS — M6281 Muscle weakness (generalized): Secondary | ICD-10-CM

## 2012-01-04 DIAGNOSIS — R42 Dizziness and giddiness: Secondary | ICD-10-CM

## 2012-01-04 DIAGNOSIS — F0390 Unspecified dementia without behavioral disturbance: Secondary | ICD-10-CM

## 2012-01-04 MED ORDER — MEMANTINE HCL 5 MG PO TABS: 5.0000 mg | ORAL_TABLET | Freq: Two times a day (BID) | ORAL | Status: DC

## 2012-01-04 NOTE — Patient Instructions (Addendum)
Go forward with physical therapy  We will do your home health certification form for that  Continue using your walker  Start nameda for memory 5 mg once daily for 1 week and then increase to twice daily  Follow up with me in about 3 months

## 2012-01-04 NOTE — Assessment & Plan Note (Signed)
This is chronic - and pt has had several falls  Requested a rail on her bed if possible and will continue to work on gait with PT and use her walker  She is homebound

## 2012-01-04 NOTE — Assessment & Plan Note (Signed)
Worse since hosp for pneumonia in nov Will continue to work on gait and strength with PT at [facility] (pt is homebound) Face to face assessment done today

## 2012-01-04 NOTE — Assessment & Plan Note (Signed)
This is gradually worsening as expected No focal neuro changes Will add low dose namenda 5 mg daily- and inc to bid if tolerated F/u 3 mo and will likely titrate up to 10 bid  Disc poss side eff

## 2012-01-04 NOTE — Progress Notes (Signed)
Subjective:    Patient ID: Courtney Castillo, female    DOB: 02-28-1927, 78 y.o.   MRN: [id number]  HPI Here for f/u and for face to face visit for her home health  She was hosp in nov for pneumonia at [facility]  Is better from that overall  Then PT ref was requested for difficulty with ambulation   Has started doing the PT with her - is going fairly  Working on walking for the most part  She has had one fall - was getting some things out of a drawer at the foot of her bed  Lost her footing and fell -hit her head on the foot of her bed --was ok and is fine-no headaches or other symptoms   Her wt is up 15 lb--is eating better and getting stronger- glad she has someone to do the cooking for her  Has to be careful not to eat too much   Is at [facility]- is still happy there- says she is treated well and some social interaction Also is getting her medicines without missing any doses  Nothing new going on   Her memory is getting worse  She is homebound - gets transportation when she needs it for doctor visits etc     Chemistry      Component Value Date/Time   NA 138 08/10/2011 0956   K 4.4 08/10/2011 0956   CL 102 08/10/2011 0956   CO2 27 08/10/2011 0956   BUN 22 08/10/2011 0956   CREATININE 0.9 08/10/2011 0956      Component Value Date/Time   CALCIUM 9.4 08/10/2011 0956   ALKPHOS 63 08/10/2011 0956   AST 25 08/10/2011 0956   ALT 17 08/10/2011 0956   BILITOT 0.5 08/10/2011 0956     Lab Results  Component Value Date   HGBA1C 6.5 08/10/2011    She is interested in addition of namenda to help slow down memory loss more   Patient Active Problem List  Diagnosis  . DIABETES MELLITUS, TYPE II  . HYPERLIPIDEMIA  . ANEMIA  . CATARACTS  . HYPERTENSION  . ALLERGIC RHINITIS  . REACTIVE AIRWAY DISEASE  . GERD  . DIVERTICULOSIS, COLON  . OSTEOARTHRITIS  . ARTHROPATHY NOS, MULTIPLE SITES  . POLYMYALGIA RHEUMATICA  . DIZZINESS  . Dementia  . Hx Breast cancer (IDC) Right, Stage  one  . HX, PERSONAL, ARTHRITIS  . Chest wall pain  . Breast cancer  . Back pain  . Knee pain  . Pain in both knees  . Muscle weakness (generalized)  . Depression (emotion)  . Overactive bladder  . Gum laceration  . Aphthous ulcer of mouth   Past Medical History  Diagnosis Date  . Diabetes mellitus     type II  . Diverticulosis of colon   . Hyperlipidemia   . Hypertension   . Arthritis     osteoarthritis knees  . GERD (gastroesophageal reflux disease)     hiatal hernia  . Allergy     allergic rhinitis/ vasometer  . Lumbar spinal stenosis 05/2007  . 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 Anemia   . Cancer     Hx of breast CA & recurrent breast CA 11/11  . Dementia 2012  . Anxiety    Past Surgical History  Procedure Date  . Carpal tunnel release   . Eye surgery     cataract extraction  . Abdominal  hysterectomy 03/1997    BSO stage 1B adenocarcinoma of endometruim  . Mastectomy w/ nodes partial 12/14/2009    IDC Right, on  . Incisional breast biopsy 10/16/2009  . Shoulder surgery   . Back surgery 2009  . Tympanoplasty 1996  . Mastectomy partial / lumpectomy 12/28/1998    NL lumpectomy  . Mastectomy partial / lumpectomy 01/07/2010    Re-excision for + margin   History  Substance Use Topics  . Smoking status: Never Smoker   . Smokeless tobacco: Never Used  . Alcohol Use: No   Family History  Problem Relation Age of Onset  . Diabetes Mother   . Heart disease Mother     MI  . Kidney disease Father   . Hypertension Father    Allergies  Allergen Reactions  . Sulfamethoxazole W-Trimethoprim     REACTION: Unknown reaction it has been years since patient took it and she only knows it was a Sulfa drug she took.   Current Outpatient Prescriptions on File Prior to Visit  Medication Sig Dispense Refill  . acetaminophen (TYLENOL) 325 MG tablet Take 650 mg by mouth every 6 (six) hours as needed.      . Alum & Mag  Hydroxide-Simeth (MAGIC MOUTHWASH) SOLN Take 5 mLs by mouth 3 (three) times daily. As needed for pain, swish and swallow  100 mL  0  . amLODipine (NORVASC) 5 MG tablet Take 1 tablet (5 mg total) by mouth daily.  30 tablet  11  . aspirin 81 MG tablet Take 1 tablet (81 mg total) by mouth daily. with meal  30 tablet  11  . Calcium-Vitamin A-Vitamin D (LIQUID CALCIUM PO) Take 120 mLs by mouth 2 (two) times daily.      Marland Kitchen donepezil (ARICEPT) 10 MG tablet Take 1 tablet (10 mg total) by mouth at bedtime.  30 tablet  4  . feeding supplement (ENSURE IMMUNE HEALTH) LIQD Drink entire contents of 237 MLs [1-can] by mouth twice daily.  60 Bottle  11  . fexofenadine (ALLEGRA) 60 MG tablet Take 60 mg by mouth daily as needed.        . meclizine (ANTIVERT) 25 MG tablet Take 0.5 tablets (12.5 mg total) by mouth 2 (two) times daily as needed for dizziness.  30 tablet  5  . meloxicam (MOBIC) 15 MG tablet Take 15 mg by mouth daily as needed. with meal       . Multiple Vitamin (MULTIVITAMIN) tablet Take 1 tablet by mouth daily.  30 tablet  11  . oxybutynin (DITROPAN-XL) 5 MG 24 hr tablet Take 5 mg by mouth daily.      . pantoprazole (PROTONIX) 40 MG tablet Take 40 mg by mouth daily as needed. indigestion      . sertraline (ZOLOFT) 25 MG tablet Take 2 tablets (50 mg total) by mouth daily.  60 tablet  11  . solifenacin (VESICARE) 5 MG tablet Take 1 tablet (5 mg total) by mouth daily.  30 tablet  5  . traMADol (ULTRAM) 50 MG tablet Take 1 tablet (50 mg total) by mouth 3 (three) times daily.  90 tablet  11  . travoprost, benzalkonium, (TRAVATAN) 0.004 % ophthalmic solution Place 1 drop into both eyes 2 (two) times daily.  2.5 mL  5  . valsartan (DIOVAN) 80 MG tablet Take one and one half tablets by mouth every morning.  45 tablet  11       Review of Systems Review of Systems  Constitutional: Negative for  fever, appetite change, fatigue and unexpected weight change. (pos for wt gain from eating regular meals) Eyes:  Negative for pain and visual disturbance.  Respiratory: Negative for cough and shortness of breath.   Cardiovascular: Negative for cp or palpitations    Gastrointestinal: Negative for nausea, diarrhea and constipation.  Genitourinary: Negative for urgency and frequency.  Skin: Negative for pallor or rash   Neurological: Negative for weakness, light-headedness, numbness and headaches. pos for chronic dizziness and poor short term memory MSK pos for chronic knee and back pain  Hematological: Negative for adenopathy. Does not bruise/bleed easily.  Psychiatric/Behavioral: Negative for dysphoric mood. The patient is not nervous/anxious.         Objective:   Physical Exam  Constitutional: She appears well-developed and well-nourished. No distress.       Frail appearing elderly female with a walker   HENT:  Head: Normocephalic and atraumatic.  Mouth/Throat: Oropharynx is clear and moist.  Eyes: Conjunctivae normal and EOM are normal. Pupils are equal, round, and reactive to light. Right eye exhibits no discharge. Left eye exhibits no discharge. No scleral icterus.  Neck: Normal range of motion. Neck supple. No JVD present. Carotid bruit is not present. No thyromegaly present.  Cardiovascular: Normal rate, regular rhythm and intact distal pulses.  Exam reveals no gallop.   Pulmonary/Chest: Effort normal and breath sounds normal. No respiratory distress. She has no wheezes.  Abdominal: Soft. Bowel sounds are normal. She exhibits no distension, no abdominal bruit and no mass. There is no tenderness.  Musculoskeletal: She exhibits tenderness. She exhibits no edema.       Poor rom LS and knees Knee medial joint line tenderness  Functionally- pt can walk with walker slowly with wide based gait (not shuffling) She cannot rise from chair without assistance  Grip and strength is symmetric  Abn rhomberg   Lymphadenopathy:    She has no cervical adenopathy.  Neurological: She is alert. She has  normal reflexes. She displays tremor. She displays no atrophy. No sensory deficit. She exhibits normal muscle tone. Coordination and gait abnormal.  Skin: Skin is warm and dry. No rash noted. No erythema. No pallor.  Psychiatric: She has a normal mood and affect.       Mood is fairly good  She confuses easily and hesitates frequently before answering questions           Assessment & Plan:

## 2012-01-12 ENCOUNTER — Other Ambulatory Visit: Payer: Self-pay | Admitting: *Deleted

## 2012-01-12 MED ORDER — AMLODIPINE BESYLATE 5 MG PO TABS: 5.0000 mg | ORAL_TABLET | Freq: Every day | ORAL | Status: DC

## 2012-01-16 ENCOUNTER — Telehealth: Payer: Self-pay | Admitting: *Deleted

## 2012-01-16 NOTE — Telephone Encounter (Signed)
Go ahead and give the verbal order, thanks

## 2012-01-16 NOTE — Telephone Encounter (Signed)
Verbal order given  

## 2012-01-16 NOTE — Telephone Encounter (Signed)
Pt had orders to stop PT for last week but Amy(PT) was sick last week so pt didn't get PT. Amy needs verbal order to stop PT this week instead of last week, please advise

## 2012-01-26 ENCOUNTER — Other Ambulatory Visit: Payer: Self-pay | Admitting: *Deleted

## 2012-01-26 MED ORDER — SERTRALINE HCL 50 MG PO TABS: 50.0000 mg | ORAL_TABLET | Freq: Every day | ORAL | Status: DC

## 2012-02-06 ENCOUNTER — Other Ambulatory Visit: Payer: Self-pay | Admitting: Family Medicine

## 2012-02-06 MED ORDER — DONEPEZIL HCL 10 MG PO TABS: 10.0000 mg | ORAL_TABLET | Freq: Every day | ORAL | Status: DC

## 2012-02-13 ENCOUNTER — Ambulatory Visit (INDEPENDENT_AMBULATORY_CARE_PROVIDER_SITE_OTHER): Payer: Medicare Other | Admitting: Family Medicine

## 2012-02-13 ENCOUNTER — Encounter: Payer: Self-pay | Admitting: Family Medicine

## 2012-02-13 VITALS — BP 136/58 | HR 102 | Temp 98.1°F | Ht 60.0 in | Wt 143.8 lb

## 2012-02-13 DIAGNOSIS — F3289 Other specified depressive episodes: Secondary | ICD-10-CM

## 2012-02-13 DIAGNOSIS — D649 Anemia, unspecified: Secondary | ICD-10-CM

## 2012-02-13 DIAGNOSIS — S41119A Laceration without foreign body of unspecified upper arm, initial encounter: Secondary | ICD-10-CM | POA: Insufficient documentation

## 2012-02-13 DIAGNOSIS — E119 Type 2 diabetes mellitus without complications: Secondary | ICD-10-CM

## 2012-02-13 DIAGNOSIS — S41109A Unspecified open wound of unspecified upper arm, initial encounter: Secondary | ICD-10-CM

## 2012-02-13 DIAGNOSIS — F0390 Unspecified dementia without behavioral disturbance: Secondary | ICD-10-CM

## 2012-02-13 DIAGNOSIS — I10 Essential (primary) hypertension: Secondary | ICD-10-CM

## 2012-02-13 DIAGNOSIS — F329 Major depressive disorder, single episode, unspecified: Secondary | ICD-10-CM

## 2012-02-13 DIAGNOSIS — E785 Hyperlipidemia, unspecified: Secondary | ICD-10-CM

## 2012-02-13 LAB — CBC WITH DIFFERENTIAL/PLATELET
Basophils Relative: 0.8 % (ref 0.0–3.0)
Eosinophils Relative: 3.8 % (ref 0.0–5.0)
Hemoglobin: 11.7 g/dL — ABNORMAL LOW (ref 12.0–15.0)
Lymphocytes Relative: 25.1 % (ref 12.0–46.0)
MCHC: 33.1 g/dL (ref 30.0–36.0)
Monocytes Relative: 8.3 % (ref 3.0–12.0)
Neutro Abs: 4.7 10*3/uL (ref 1.4–7.7)
RBC: 4.13 Mil/uL (ref 3.87–5.11)
WBC: 7.5 10*3/uL (ref 4.5–10.5)

## 2012-02-13 LAB — COMPREHENSIVE METABOLIC PANEL
Albumin: 3.8 g/dL (ref 3.5–5.2)
BUN: 23 mg/dL (ref 6–23)
CO2: 30 mEq/L (ref 19–32)
Calcium: 9.4 mg/dL (ref 8.4–10.5)
Chloride: 103 mEq/L (ref 96–112)
Creatinine, Ser: 1 mg/dL (ref 0.4–1.2)
GFR: 56.97 mL/min — ABNORMAL LOW (ref >60.00)
Potassium: 4.5 mEq/L (ref 3.5–5.1)

## 2012-02-13 LAB — LDL CHOLESTEROL, DIRECT: Direct LDL: 133.6 mg/dL

## 2012-02-13 LAB — LIPID PANEL
HDL: 33.6 mg/dL — ABNORMAL LOW (ref >39.00)
Triglycerides: 161 mg/dL — ABNORMAL HIGH (ref 0.0–149.0)

## 2012-02-13 NOTE — Assessment & Plan Note (Signed)
Stable on zoloft Enc more social interaction

## 2012-02-13 NOTE — Assessment & Plan Note (Signed)
Lab today- had prev improved

## 2012-02-13 NOTE — Progress Notes (Signed)
Subjective:    Patient ID: Courtney Castillo, female    DOB: 09-11-1927, 79 y.o.   MRN: [id number]  HPI Here for 6 mo f/u of chronic health problems  Is doing ok overall   bp is stable today  No cp or palpitations or headaches or edema  No side effects to medicines  BP Readings from Last 3 Encounters:  02/13/12 136/58  01/04/12 122/54  08/17/11 138/82     Wt is up 5 lb with bmi of 28 Appetite has been good overall   Hx of high lipids Lab Results  Component Value Date   CHOL 138 12/22/2010   HDL 24* 12/22/2010   LDLCALC 88 12/22/2010   TRIG 128 12/22/2010   CHOLHDL 5.8 12/22/2010   no med  DM Lab Results  Component Value Date   HGBA1C 6.5 08/10/2011    Anemia - likely chronic dz Lab Results  Component Value Date   WBC 8.3 08/10/2011   HGB 11.3* 08/10/2011   HCT 34.2* 08/10/2011   MCV 86.7 08/10/2011   PLT 258.0 08/10/2011     Depression-on zoloft Mood is fair - occ gets down but not all the time  She is at [facility] - and feels like they take good care of her  Less social than she used to be  Sleeps well   Dementia- aricept and namenda  Thinks her memory is about the same- no better or worse   Family does not check in too often overall   Has a small skin tear on her left arm - is clean and healing      Patient Active Problem List  Diagnosis  . DIABETES MELLITUS, TYPE II  . HYPERLIPIDEMIA  . ANEMIA  . CATARACTS  . HYPERTENSION  . ALLERGIC RHINITIS  . REACTIVE AIRWAY DISEASE  . GERD  . DIVERTICULOSIS, COLON  . OSTEOARTHRITIS  . ARTHROPATHY NOS, MULTIPLE SITES  . POLYMYALGIA RHEUMATICA  . DIZZINESS  . Dementia  . Hx Breast cancer (IDC) Right, Stage one  . HX, PERSONAL, ARTHRITIS  . Chest wall pain  . Breast cancer  . Back pain  . Knee pain  . Pain in both knees  . Muscle weakness (generalized)  . Depression (emotion)  . Overactive bladder  . Gum laceration  . Aphthous ulcer of mouth   Past Medical History  Diagnosis Date  . Diabetes mellitus       type II  . Diverticulosis of colon   . Hyperlipidemia   . Hypertension   . Arthritis     osteoarthritis knees  . GERD (gastroesophageal reflux disease)     hiatal hernia  . Allergy     allergic rhinitis/ vasometer  . Lumbar spinal stenosis 05/2007  . 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 Anemia   . Cancer     Hx of breast CA & recurrent breast CA 11/11  . Dementia 2012  . Anxiety    Past Surgical History  Procedure Date  . Carpal tunnel release   . Eye surgery     cataract extraction  . Abdominal hysterectomy 03/1997    BSO stage 1B adenocarcinoma of endometruim  . Mastectomy w/ nodes partial 12/14/2009    IDC Right, on  . Incisional breast biopsy 10/16/2009  . Shoulder surgery   . Back surgery 2009  . Tympanoplasty 1996  . Mastectomy partial / lumpectomy 12/28/1998    NL lumpectomy  . Mastectomy  partial / lumpectomy 01/07/2010    Re-excision for + margin   History  Substance Use Topics  . Smoking status: Never Smoker   . Smokeless tobacco: Never Used  . Alcohol Use: No   Family History  Problem Relation Age of Onset  . Diabetes Mother   . Heart disease Mother     MI  . Kidney disease Father   . Hypertension Father    Allergies  Allergen Reactions  . Sulfamethoxazole W-Trimethoprim     REACTION: Unknown reaction it has been years since patient took it and she only knows it was a Sulfa drug she took.   Current Outpatient Prescriptions on File Prior to Visit  Medication Sig Dispense Refill  . acetaminophen (TYLENOL) 325 MG tablet Take 650 mg by mouth every 6 (six) hours as needed.      . Alum & Mag Hydroxide-Simeth (MAGIC MOUTHWASH) SOLN Take 5 mLs by mouth 3 (three) times daily. As needed for pain, swish and swallow  100 mL  0  . amLODipine (NORVASC) 5 MG tablet Take 1 tablet (5 mg total) by mouth daily.  30 tablet  6  . aspirin 81 MG tablet Take 1 tablet (81 mg total) by mouth daily. with meal  30 tablet  11  .  Calcium-Vitamin A-Vitamin D (LIQUID CALCIUM PO) Take 120 mLs by mouth 2 (two) times daily.      Marland Kitchen donepezil (ARICEPT) 10 MG tablet Take 1 tablet (10 mg total) by mouth at bedtime.  30 tablet  1  . feeding supplement (ENSURE IMMUNE HEALTH) LIQD Drink entire contents of 237 MLs [1-can] by mouth twice daily.  60 Bottle  11  . fexofenadine (ALLEGRA) 60 MG tablet Take 60 mg by mouth daily as needed.        . memantine (NAMENDA) 5 MG tablet Take 1 tablet (5 mg total) by mouth 2 (two) times daily.  60 tablet  3  . Multiple Vitamin (MULTIVITAMIN) tablet Take 1 tablet by mouth daily.  30 tablet  11  . oxybutynin (DITROPAN-XL) 5 MG 24 hr tablet Take 5 mg by mouth daily.      . sertraline (ZOLOFT) 50 MG tablet Take 1 tablet (50 mg total) by mouth daily.  30 tablet  3  . valsartan (DIOVAN) 80 MG tablet Take one and one half tablets by mouth every morning.  45 tablet  11  . meclizine (ANTIVERT) 25 MG tablet Take 0.5 tablets (12.5 mg total) by mouth 2 (two) times daily as needed for dizziness.  30 tablet  5  . meloxicam (MOBIC) 15 MG tablet Take 15 mg by mouth daily as needed. with meal       . pantoprazole (PROTONIX) 40 MG tablet Take 40 mg by mouth daily as needed. indigestion      . solifenacin (VESICARE) 5 MG tablet Take 1 tablet (5 mg total) by mouth daily.  30 tablet  5  . traMADol (ULTRAM) 50 MG tablet Take 1 tablet (50 mg total) by mouth 3 (three) times daily.  90 tablet  11  . travoprost, benzalkonium, (TRAVATAN) 0.004 % ophthalmic solution Place 1 drop into both eyes 2 (two) times daily.  2.5 mL  5    Review of Systems Review of Systems  Constitutional: Negative for fever, appetite change, fatigue and unexpected weight change.  Eyes: Negative for pain and visual disturbance.  Respiratory: Negative for cough and shortness of breath.   Cardiovascular: Negative for cp or palpitations    Gastrointestinal: Negative  for nausea, diarrhea and constipation.  Genitourinary: Negative for urgency and  frequency.  Skin: Negative for pallor or rash   MSK pos for chronic back pain  Neurological: Negative for weakness, light-headedness, numbness and headaches.  Hematological: Negative for adenopathy. Does not bruise/bleed easily.  Psychiatric/Behavioral: pos for depression and anxiety that are improved and stable , pos for short term memory loss         Objective:   Physical Exam  Constitutional: She appears well-developed and well-nourished. No distress.       Frail appearing elderly female with dementia   HENT:  Head: Normocephalic and atraumatic.  Mouth/Throat: Oropharynx is clear and moist.  Eyes: Conjunctivae normal and EOM are normal. Pupils are equal, round, and reactive to light. No scleral icterus.  Neck: Normal range of motion. Neck supple. No JVD present. Carotid bruit is not present. No thyromegaly present.  Cardiovascular: Normal rate, regular rhythm, normal heart sounds and intact distal pulses.  Exam reveals no gallop.   Pulmonary/Chest: Effort normal and breath sounds normal. No respiratory distress. She has no wheezes. She exhibits no tenderness.  Abdominal: Soft. Bowel sounds are normal. She exhibits no distension, no abdominal bruit and no mass. There is no tenderness.  Musculoskeletal: She exhibits no edema and no tenderness.  Lymphadenopathy:    She has no cervical adenopathy.  Neurological: She is alert. She has normal reflexes. She displays tremor. Coordination normal.       Mild hand tremor Ambulates well with walker  Skin: Skin is warm and dry. No rash noted. No erythema. No pallor.       1 cm skin tear L arm - scabbed and healing/ no signs of infection  Fingernails are very long- some of them ragged   Psychiatric: Her speech is normal and behavior is normal. Her mood appears anxious. Cognition and memory are impaired. She does not exhibit a depressed mood. She exhibits abnormal recent memory.          Assessment & Plan:

## 2012-02-13 NOTE — Assessment & Plan Note (Signed)
a1c today  Pt has gained 5 lb - which is ok (was loosing)  No symptoms

## 2012-02-13 NOTE — Assessment & Plan Note (Signed)
Stable currently- at facility  On namenda and aricept without side eff  Still able to do most ADLs

## 2012-02-13 NOTE — Assessment & Plan Note (Signed)
Lab today Pt eating normally- will monitor

## 2012-02-13 NOTE — Patient Instructions (Addendum)
Labs today  Take care of yourself  Have the staff help you cut fingernails  Keep the skin tear on arm clean and dry and lightly covered until it heals Follow up in 6 months for annual exam with labs prior

## 2012-02-13 NOTE — Assessment & Plan Note (Signed)
bp in fair control at this time  No changes needed  Disc lifstyle change with low sodium diet and exercise  Lab today 

## 2012-02-13 NOTE — Assessment & Plan Note (Signed)
Small - poss from scratch with long nails Asked [facility] to get someone to cut these  Adv to keep clean and cover until healed

## 2012-02-19 ENCOUNTER — Other Ambulatory Visit: Payer: Self-pay | Admitting: Family Medicine

## 2012-02-21 ENCOUNTER — Other Ambulatory Visit: Payer: Self-pay | Admitting: *Deleted

## 2012-02-21 MED ORDER — TRAVOPROST 0.004 % OP SOLN: 1.0000 [drp] | Freq: Two times a day (BID) | OPHTHALMIC | Status: AC

## 2012-02-21 NOTE — Telephone Encounter (Signed)
Received fax refill request.

## 2012-03-26 ENCOUNTER — Ambulatory Visit: Payer: Medicare Other | Admitting: Family Medicine

## 2012-04-03 ENCOUNTER — Ambulatory Visit: Payer: Medicare Other | Admitting: Family Medicine

## 2012-04-27 ENCOUNTER — Emergency Department: Payer: Self-pay | Admitting: Emergency Medicine

## 2012-05-10 ENCOUNTER — Other Ambulatory Visit: Payer: Self-pay | Admitting: *Deleted

## 2012-05-10 MED ORDER — MEMANTINE HCL 5 MG PO TABS: 5.0000 mg | ORAL_TABLET | Freq: Two times a day (BID) | ORAL | Status: DC

## 2012-05-22 ENCOUNTER — Telehealth: Payer: Self-pay | Admitting: Family Medicine

## 2012-05-22 NOTE — Telephone Encounter (Signed)
Faxed request to [facility] for records from this ER visit

## 2012-05-22 NOTE — Telephone Encounter (Signed)
Courtney/RN advise that we received records and she let me know that pt did finish her abx and she is doing a lot better and it has resolved

## 2012-05-22 NOTE — Telephone Encounter (Signed)
[facility] Triage Call Report Triage Record Num: [id number] Operator: Celesta Aver Patient Name: Courtney Castillo Call Date & Time: 05/21/2012 5:48:13PM Patient Phone: [phone] PCP: Idamae Schuller A. Tower Patient Gender: Female PCP Fax : Patient DOB: [birth year] Practice Name: [facility] Reason for Call: Caller: Courtney/RN; PCP: Roxy Manns [facility]); CB#: [phone]; Call regarding Caller states the staff did not inform the physician that the patient fell 04/27/12. Patient hit her head and went to the Emergency Room and returned back within 12 hours. No injury and patient is doing well since. Caller apologizes for not calling prior.; Caller is reporting a fall from 04/27/12. Patient is doing well currently and had no injury. Patient was evaluated in the Emergency Department and released within 12 hours. Protocol(s) Used: Office Note Recommended Outcome per Protocol: Information Noted and Sent to Office Reason for Outcome: Caller information to office Care Advice: ~ 05/21/2012 5:52:37PM Page 1 of 1 [email or web address]

## 2012-05-22 NOTE — Telephone Encounter (Signed)
I reviewed her records - it looks like they gave her an antibiotic for poss sinus infection found on her CT scan Let me know if she does not continue to improve thanks

## 2012-05-22 NOTE — Telephone Encounter (Signed)
Notes received an placed in your inbox

## 2012-05-22 NOTE — Telephone Encounter (Signed)
Thanks for that update- please send for those records  I'm glad she is feeling ok

## 2012-06-04 ENCOUNTER — Other Ambulatory Visit: Payer: Self-pay | Admitting: Family Medicine

## 2012-06-04 MED ORDER — DONEPEZIL HCL 10 MG PO TABS: 10.0000 mg | ORAL_TABLET | Freq: Every day | ORAL | Status: DC

## 2012-07-19 ENCOUNTER — Telehealth: Payer: Self-pay

## 2012-07-19 NOTE — Telephone Encounter (Signed)
Courtney at [facility] left v/m requesting lab orders from Dr Milinda Antis be faxed to [facility] (no fax # given).  labs can be done at [facility] prior to appt with Dr Milinda Antis on 08/15/12.Please advise.

## 2012-07-23 NOTE — Telephone Encounter (Signed)
I would rather have her labs drawn the day of the visit then, because I want them to stay within the system so I can compare with other labs  Tell them we will do labs that day and she does not need to fast

## 2012-07-23 NOTE — Telephone Encounter (Signed)
Left message on Courtney's voicemail letting her know pt doesn't need labs done prior to appt. Dr. Milinda Antis wants her to get them here at our office the day of her appt

## 2012-08-03 ENCOUNTER — Emergency Department: Payer: Self-pay | Admitting: Emergency Medicine

## 2012-08-03 ENCOUNTER — Telehealth: Payer: Self-pay | Admitting: Family Medicine

## 2012-08-03 NOTE — Telephone Encounter (Signed)
[facility] Triage Call Report Triage Record Num: [id number] Operator: Claudie Leach Patient Name: Courtney Castillo Call Date & Time: 08/03/2012 1:37:17AM Patient Phone: [phone] PCP: Audrie Gallus. Tower Patient Gender: Female PCP Fax : Patient DOB: 30-Sep-1927 Practice Name:  [facility] Reason for Call: Caller: Tara/Med Jeremy Johann; PCP: Roxy Manns [facility]); CB#: [phone]; Call regarding rolled out of bed and hit right eyebrow on night stand. Has a 1/2 inch gash to the right eyebrow and has swelling to the right eyebrow. Occurred at 12:35 am on 08/03/12. Vitals are pulse 98, respirations 20 and blood pressure 156/98. Patient was sent to [facility] at 1:00 am. Note sent to the office. Protocol(s) Used: Office Note Recommended Outcome per Protocol: Information Noted and Sent to Office Reason for Outcome: Caller information to office Care Advice: ~ 08/03/2012 1:43:18AM Page 1 of 1 [id number]

## 2012-08-03 NOTE — Telephone Encounter (Signed)
Aware, thank you- please call for ER notes when they are ready

## 2012-08-06 NOTE — Telephone Encounter (Signed)
Notes reviewed, thanks

## 2012-08-06 NOTE — Telephone Encounter (Signed)
ER notes placed in your inbox

## 2012-08-08 ENCOUNTER — Emergency Department: Payer: Self-pay | Admitting: Emergency Medicine

## 2012-08-15 ENCOUNTER — Ambulatory Visit: Payer: Medicare Other | Admitting: Family Medicine

## 2012-08-15 ENCOUNTER — Ambulatory Visit (INDEPENDENT_AMBULATORY_CARE_PROVIDER_SITE_OTHER): Payer: Medicare Other | Admitting: Family Medicine

## 2012-08-15 ENCOUNTER — Encounter: Payer: Self-pay | Admitting: Family Medicine

## 2012-08-15 VITALS — BP 138/78 | HR 95 | Temp 98.7°F | Ht 60.0 in | Wt 159.0 lb

## 2012-08-15 DIAGNOSIS — S0181XA Laceration without foreign body of other part of head, initial encounter: Secondary | ICD-10-CM | POA: Insufficient documentation

## 2012-08-15 DIAGNOSIS — I10 Essential (primary) hypertension: Secondary | ICD-10-CM

## 2012-08-15 DIAGNOSIS — Z9181 History of falling: Secondary | ICD-10-CM

## 2012-08-15 DIAGNOSIS — E785 Hyperlipidemia, unspecified: Secondary | ICD-10-CM

## 2012-08-15 DIAGNOSIS — IMO0002 Reserved for concepts with insufficient information to code with codable children: Secondary | ICD-10-CM

## 2012-08-15 DIAGNOSIS — S0181XS Laceration without foreign body of other part of head, sequela: Secondary | ICD-10-CM

## 2012-08-15 DIAGNOSIS — E119 Type 2 diabetes mellitus without complications: Secondary | ICD-10-CM

## 2012-08-15 LAB — COMPREHENSIVE METABOLIC PANEL
ALT: 21 U/L (ref 0–35)
Alkaline Phosphatase: 68 U/L (ref 39–117)
Creatinine, Ser: 1 mg/dL (ref 0.4–1.2)
Sodium: 137 mEq/L (ref 135–145)
Total Bilirubin: 0.3 mg/dL (ref 0.3–1.2)
Total Protein: 8.1 g/dL (ref 6.0–8.3)

## 2012-08-15 LAB — LDL CHOLESTEROL, DIRECT: Direct LDL: 136.2 mg/dL

## 2012-08-15 LAB — CBC WITH DIFFERENTIAL/PLATELET
Basophils Absolute: 0.1 10*3/uL (ref 0.0–0.1)
Eosinophils Absolute: 0.2 10*3/uL (ref 0.0–0.7)
HCT: 36.7 % (ref 36.0–46.0)
Lymphs Abs: 2.3 10*3/uL (ref 0.7–4.0)
MCHC: 33.3 g/dL (ref 30.0–36.0)
MCV: 89.9 fl (ref 78.0–100.0)
Monocytes Absolute: 0.9 10*3/uL (ref 0.1–1.0)
Platelets: 247 10*3/uL (ref 150.0–400.0)
RDW: 15 % — ABNORMAL HIGH (ref 11.5–14.6)

## 2012-08-15 LAB — LIPID PANEL
Cholesterol: 221 mg/dL — ABNORMAL HIGH (ref 0–200)
HDL: 26.7 mg/dL — ABNORMAL LOW (ref >39.00)
Total CHOL/HDL Ratio: 8
Triglycerides: 346 mg/dL — ABNORMAL HIGH (ref 0.0–149.0)

## 2012-08-15 NOTE — Progress Notes (Signed)
Subjective:    Patient ID: Courtney Castillo, female    DOB: 10/04/1927, 79 y.o.   MRN: [id number]  HPI Here for f/u of chronic problems - and suture removal from fall 7/18 after a trip and fall going to the bathroom  She states she was using her walker and slipped - she lost hold of her walker- hit furniture beside the bed and cut her face   Had sutures at [facility]  CT scan of brain showed atrophy-nothing new  No facial pain- she feels fine No headaches Her chronic dizziness is the same   Feeling ok overall  Is more weak as she gets older  Is eating much better - gained 16 lb - three good meals per day   Has DM- she is eating some sweets - needs to check lab work Feels good and no excessive thirst or urination   bp is stable today  No cp or palpitations or headaches or edema  No side effects to medicines  BP Readings from Last 3 Encounters:  08/15/12 138/78  02/13/12 136/58  01/04/12 122/54     Will check lipids today She thinks dementia and memory are stable   She is socializing at [place] and she is dating someone  That is enjoyable  Patient Active Problem List   Diagnosis Date Noted  . Skin tear of upper arm without complication 02/13/2012  . Gum laceration 08/17/2011  . Aphthous ulcer of mouth 08/17/2011  . Overactive bladder 04/06/2011  . Muscle weakness (generalized) 02/21/2011  . Depression (emotion) 02/21/2011  . Pain in both knees 12/21/2010  . Knee pain 07/26/2010  . Chest wall pain 05/12/2010  . Breast cancer 05/12/2010  . Back pain 05/12/2010  . ANEMIA 09/26/2007  . Dementia 09/17/2007  . DIZZINESS 02/20/2007  . POLYMYALGIA RHEUMATICA 08/22/2006  . ARTHROPATHY NOS, MULTIPLE SITES 07/19/2006  . DIABETES MELLITUS, TYPE II 07/11/2006  . HYPERLIPIDEMIA 07/11/2006  . CATARACTS 07/11/2006  . HYPERTENSION 07/11/2006  . ALLERGIC RHINITIS 07/11/2006  . REACTIVE AIRWAY DISEASE 07/11/2006  . GERD 07/11/2006  . DIVERTICULOSIS, COLON 07/11/2006  . OSTEOARTHRITIS  07/11/2006  . HX, PERSONAL, ARTHRITIS 07/10/2006  . Hx Breast cancer (IDC) Right, Stage one 12/18/1998    Class: Stage 1   Past Medical History  Diagnosis Date  . Diabetes mellitus     type II  . Diverticulosis of colon   . Hyperlipidemia   . Hypertension   . Arthritis     osteoarthritis knees  . GERD (gastroesophageal reflux disease)     hiatal hernia  . Allergy     allergic rhinitis/ vasometer  . Lumbar spinal stenosis 05/2007  . 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 Anemia   . Cancer     Hx of breast CA & recurrent breast CA 11/11  . Dementia 2012  . Anxiety    Past Surgical History  Procedure Laterality Date  . Carpal tunnel release    . Eye surgery      cataract extraction  . Abdominal hysterectomy  03/1997    BSO stage 1B adenocarcinoma of endometruim  . Mastectomy w/ nodes partial  12/14/2009    IDC Right, on  . Incisional breast biopsy  10/16/2009  . Shoulder surgery    . Back surgery  2009  . Tympanoplasty  1996  . Mastectomy partial / lumpectomy  12/28/1998    NL lumpectomy  . Mastectomy partial / lumpectomy  01/07/2010    Re-excision for + margin   History  Substance Use Topics  . Smoking status: Never Smoker   . Smokeless tobacco: Never Used  . Alcohol Use: No   Family History  Problem Relation Age of Onset  . Diabetes Mother   . Heart disease Mother     MI  . Kidney disease Father   . Hypertension Father    Allergies  Allergen Reactions  . Sulfamethoxazole W-Trimethoprim     REACTION: Unknown reaction it has been years since patient took it and she only knows it was a Sulfa drug she took.   Current Outpatient Prescriptions on File Prior to Visit  Medication Sig Dispense Refill  . acetaminophen (TYLENOL) 325 MG tablet Take 650 mg by mouth every 6 (six) hours as needed.      . Alum & Mag Hydroxide-Simeth (MAGIC MOUTHWASH) SOLN Take 5 mLs by mouth 3 (three) times daily. As needed for pain, swish and  swallow  100 mL  0  . amLODipine (NORVASC) 5 MG tablet Take 1 tablet (5 mg total) by mouth daily.  30 tablet  6  . aspirin 81 MG tablet Take 1 tablet (81 mg total) by mouth daily. with meal  30 tablet  11  . Calcium-Vitamin A-Vitamin D (LIQUID CALCIUM PO) Take 120 mLs by mouth 2 (two) times daily.      Marland Kitchen donepezil (ARICEPT) 10 MG tablet Take 1 tablet (10 mg total) by mouth at bedtime.  30 tablet  5  . feeding supplement (ENSURE IMMUNE HEALTH) LIQD Drink entire contents of 237 MLs [1-can] by mouth twice daily.  60 Bottle  11  . fexofenadine (ALLEGRA) 60 MG tablet Take 60 mg by mouth daily as needed.        . meclizine (ANTIVERT) 25 MG tablet Take 0.5 tablets (12.5 mg total) by mouth 2 (two) times daily as needed for dizziness.  30 tablet  5  . memantine (NAMENDA) 5 MG tablet Take 1 tablet (5 mg total) by mouth 2 (two) times daily.  60 tablet  6  . Multiple Vitamin (MULTIVITAMIN) tablet Take 1 tablet by mouth daily.  30 tablet  11  . oxybutynin (DITROPAN-XL) 5 MG 24 hr tablet Take 5 mg by mouth daily.      . sertraline (ZOLOFT) 50 MG tablet Take 1 tablet (50 mg total) by mouth daily.  30 tablet  3  . travoprost, benzalkonium, (TRAVATAN) 0.004 % ophthalmic solution Place 1 drop into both eyes 2 (two) times daily.  2.5 mL  5  . traMADol (ULTRAM) 50 MG tablet Take 1 tablet (50 mg total) by mouth 3 (three) times daily.  90 tablet  11  . valsartan (DIOVAN) 80 MG tablet Take one and one half tablets by mouth every morning.  45 tablet  11   No current facility-administered medications on file prior to visit.    Review of Systems Review of Systems  Constitutional: Negative for fever, appetite change, fatigue and unexpected weight change.  Eyes: Negative for pain and visual disturbance.  Respiratory: Negative for cough and shortness of breath.   Cardiovascular: Negative for cp or palpitations    Gastrointestinal: Negative for nausea, diarrhea and constipation.  Genitourinary: Negative for urgency and  frequency.  Skin: Negative for pallor or rash   Neurological: Negative for weakness, , numbness and headaches. pos for memory loss and chronic dizziness that have not changed  Hematological: Negative for adenopathy. Does not bruise/bleed easily.  Psychiatric/Behavioral: Negative for dysphoric  mood. The patient is not nervous/anxious.         Objective:   Physical Exam  Constitutional: She appears well-developed and well-nourished. No distress.  HENT:  Head: Normocephalic.  Mouth/Throat: Oropharynx is clear and moist.  Eyes: Conjunctivae and EOM are normal. Pupils are equal, round, and reactive to light. No scleral icterus.  Neck: Normal range of motion. Neck supple.  Cardiovascular: Normal rate, regular rhythm and intact distal pulses.   Murmur heard. Pulmonary/Chest: Effort normal and breath sounds normal. No respiratory distress. She has no wheezes. She has no rales.  Musculoskeletal: She exhibits no edema.  Lymphadenopathy:    She has no cervical adenopathy.  Neurological: She is alert. She has normal reflexes. No cranial nerve deficit. She exhibits normal muscle tone. Coordination normal.  Skin: Skin is warm and dry. No rash noted. No erythema. No pallor.  Laceration R cheek with 4 simple interrupted sutures- removed and steri strips applied Good wound edge approximation  Scab over R eyebrow- healed and steri strips came off in my hand   No signs of infection  Psychiatric: She has a normal mood and affect. She exhibits abnormal recent memory.          Assessment & Plan:

## 2012-08-15 NOTE — Patient Instructions (Addendum)
Keep wound on face clean  Let the steri strips come off naturally  You can bathe normally with soap and water  If any problems let me know Labs today  Follow up in 1-2 months for your annual exam (we are doing the labs today) -- 30 min appt is fine

## 2012-08-16 NOTE — Assessment & Plan Note (Signed)
This is well healed and 4 simple interrupted sutures removed/ steri strips applied  Disc healing-watching for s/s of infection

## 2012-08-16 NOTE — Assessment & Plan Note (Signed)
Lab today Wt gain noted-better nutrition but some sweets-disc avoiding these for the most part  Disc at f/u

## 2012-08-16 NOTE — Assessment & Plan Note (Signed)
Enc use of walker at all times  In light of chronic dizziness (refractory to tx)- recommended her strongly to ask for help when she has to get up in the night and transfer to commode or any time she is too unsteady to walk confidently with walker

## 2012-08-16 NOTE — Assessment & Plan Note (Signed)
bp in fair control at this time  No changes needed  Disc lifstyle change with low sodium diet and exercise  Lab and disc at f/u

## 2012-08-16 NOTE — Assessment & Plan Note (Signed)
Lab and rev at f/u  Disc diet- she is eating much more in her assisted living facility

## 2012-08-17 ENCOUNTER — Encounter: Payer: Self-pay | Admitting: Family Medicine

## 2012-08-28 ENCOUNTER — Telehealth: Payer: Self-pay | Admitting: Family Medicine

## 2012-08-28 NOTE — Telephone Encounter (Signed)
Courtney Castillo dropped  Off a veterans form to be filled out.

## 2012-08-29 DIAGNOSIS — Z0279 Encounter for issue of other medical certificate: Secondary | ICD-10-CM

## 2012-08-29 NOTE — Telephone Encounter (Signed)
Done and in IN box 

## 2012-08-29 NOTE — Telephone Encounter (Signed)
Form placed in your inbox

## 2012-08-30 NOTE — Telephone Encounter (Signed)
Left voicemail letting Courtney Castillo know forms ready for pick-up and their is a $20 fee

## 2012-10-03 ENCOUNTER — Telehealth: Payer: Self-pay

## 2012-10-03 NOTE — Telephone Encounter (Signed)
Courtney Castillo with [facility] left v/m requesting verbal orders for PT home health to do initial evaluation for need of PT due to recent pt fall. Courtney Castillo does not know when fall occurred or extent of injuries if any. Unable to reach pts nephew by phone to ck on pt.

## 2012-10-03 NOTE — Telephone Encounter (Signed)
[name] pt's nephew called back and he was unaware of pt needing PT home health and he said to his knowledge pts last fall was 07/2012 and pt saw Dr Milinda Antis. [name] request cb after Dr Milinda Antis reviews request.

## 2012-10-03 NOTE — Telephone Encounter (Signed)
I was looking for paperwork - but Epic is stopping me from going into the media portion of her chart- says there is an error running scripts on that page ?  Can someone look and see if we had any documentation of a fall after her last visit with me? thanks

## 2012-10-04 NOTE — Telephone Encounter (Signed)
Verbal order given to [facility]

## 2012-10-04 NOTE — Telephone Encounter (Signed)
Yes please - PT is very useful for fall prevention

## 2012-10-04 NOTE — Telephone Encounter (Signed)
Pt's nephew called [facility] and they did inform him of the fall but said she didn't injure herself so he wasn't aware that she need PT, I advise nephew that they want order to do initial evaluation for need of PT due to recent pt falls, nephew said he is okay with this, is it okay to give verbal order?

## 2012-10-04 NOTE — Telephone Encounter (Signed)
There is a note in the media tab that [facility] faxed [place] saying pt fell on 09/13/12 in her closet after losing her balance

## 2012-10-04 NOTE — Telephone Encounter (Signed)
Please let her nephew know this - and he may need to speak to staff to inform him better when things happen there

## 2012-10-05 ENCOUNTER — Telehealth: Payer: Self-pay | Admitting: *Deleted

## 2012-10-05 NOTE — Telephone Encounter (Signed)
Left detailed message on voicemail.  

## 2012-10-05 NOTE — Telephone Encounter (Signed)
That is fine -please ask them to let her family - [name] Cathlean Cower know what they are planning - hope this will improve balance

## 2012-10-05 NOTE — Telephone Encounter (Signed)
Courtney Castillo (PT) with [facility] wants verbal okay for home health PT for 1x a week for 1 week, then 2x a week for 4 weeks and 1x a week for 1 last week, for strengthening, balance and safety, please advise

## 2012-10-10 ENCOUNTER — Telehealth: Payer: Self-pay

## 2012-10-10 ENCOUNTER — Telehealth: Payer: Self-pay | Admitting: Family Medicine

## 2012-10-10 ENCOUNTER — Other Ambulatory Visit: Payer: Medicare Other

## 2012-10-10 ENCOUNTER — Other Ambulatory Visit: Payer: Self-pay | Admitting: Family Medicine

## 2012-10-10 DIAGNOSIS — D649 Anemia, unspecified: Secondary | ICD-10-CM

## 2012-10-10 DIAGNOSIS — E785 Hyperlipidemia, unspecified: Secondary | ICD-10-CM

## 2012-10-10 DIAGNOSIS — E119 Type 2 diabetes mellitus without complications: Secondary | ICD-10-CM

## 2012-10-10 DIAGNOSIS — I10 Essential (primary) hypertension: Secondary | ICD-10-CM

## 2012-10-10 NOTE — Telephone Encounter (Signed)
I advise [facility] of Dr. Royden Purl comments

## 2012-10-10 NOTE — Telephone Encounter (Signed)
Message copied by Judy Pimple on Wed Oct 10, 2012  6:16 AM ------      Message from: Alvina Chou      Created: Mon Oct 08, 2012  3:54 PM      Regarding: Lab orders for Wednesday, 9.24.14       Patient is scheduled for CPX labs, please order future labs, Thanks , Terri       ------

## 2012-10-10 NOTE — Telephone Encounter (Signed)
Courtney Castillo PT with [facility] at [facility] left v/m; pts BP today was 134/50 and P 92 and R 22. Since diastolic BP is below 60 guidelines are to notify PCP. Please advise.

## 2012-10-10 NOTE — Telephone Encounter (Signed)
Thanks for letting me know  She has f/u with me on the 30th and I will address that , however if she is feeling poorly let me know - and I will see if we need to change something before that

## 2012-10-16 ENCOUNTER — Encounter: Payer: Medicare Other | Admitting: Family Medicine

## 2012-10-16 DIAGNOSIS — Z0289 Encounter for other administrative examinations: Secondary | ICD-10-CM

## 2012-10-25 DIAGNOSIS — R262 Difficulty in walking, not elsewhere classified: Secondary | ICD-10-CM

## 2012-10-25 DIAGNOSIS — IMO0001 Reserved for inherently not codable concepts without codable children: Secondary | ICD-10-CM

## 2012-10-25 DIAGNOSIS — F0390 Unspecified dementia without behavioral disturbance: Secondary | ICD-10-CM

## 2012-10-25 DIAGNOSIS — M159 Polyosteoarthritis, unspecified: Secondary | ICD-10-CM

## 2012-11-13 ENCOUNTER — Emergency Department: Payer: Self-pay | Admitting: Emergency Medicine

## 2012-11-13 LAB — URINALYSIS, COMPLETE
Bilirubin,UR: NEGATIVE
Blood: NEGATIVE
Glucose,UR: NEGATIVE mg/dL (ref 0–75)
Ketone: NEGATIVE
Leukocyte Esterase: NEGATIVE
Squamous Epithelial: 19
WBC UR: <1 /HPF (ref 0–5)

## 2012-11-13 LAB — COMPREHENSIVE METABOLIC PANEL
Albumin: 3.2 g/dL — ABNORMAL LOW (ref 3.4–5.0)
Anion Gap: 5 — ABNORMAL LOW (ref 7–16)
Bilirubin,Total: 0.3 mg/dL (ref 0.2–1.0)
Calcium, Total: 9.2 mg/dL (ref 8.5–10.1)
Creatinine: 1.09 mg/dL (ref 0.60–1.30)
EGFR (African American): 54 — ABNORMAL LOW
EGFR (Non-African Amer.): 47 — ABNORMAL LOW
Glucose: 204 mg/dL — ABNORMAL HIGH (ref 65–99)
SGOT(AST): 27 U/L (ref 15–37)
SGPT (ALT): 23 U/L (ref 12–78)
Total Protein: 7.7 g/dL (ref 6.4–8.2)

## 2012-11-13 LAB — CBC
HCT: 34.7 % — ABNORMAL LOW (ref 35.0–47.0)
HGB: 11.7 g/dL — ABNORMAL LOW (ref 12.0–16.0)
MCH: 30.1 pg (ref 26.0–34.0)
MCHC: 33.9 g/dL (ref 32.0–36.0)
MCV: 89 fL (ref 80–100)
RDW: 15 % — ABNORMAL HIGH (ref 11.5–14.5)
WBC: 9.6 10*3/uL (ref 3.6–11.0)

## 2012-11-13 LAB — LIPASE, BLOOD: Lipase: 125 U/L (ref 73–393)

## 2012-11-16 ENCOUNTER — Observation Stay: Payer: Self-pay | Admitting: Internal Medicine

## 2012-11-16 LAB — COMPREHENSIVE METABOLIC PANEL
Alkaline Phosphatase: 102 U/L (ref 50–136)
Bilirubin,Total: 0.3 mg/dL (ref 0.2–1.0)
Calcium, Total: 9.1 mg/dL (ref 8.5–10.1)
Co2: 28 mmol/L (ref 21–32)
Creatinine: 1.04 mg/dL (ref 0.60–1.30)
EGFR (Non-African Amer.): 50 — ABNORMAL LOW
Glucose: 126 mg/dL — ABNORMAL HIGH (ref 65–99)
SGPT (ALT): 19 U/L (ref 12–78)
Total Protein: 7.7 g/dL (ref 6.4–8.2)

## 2012-11-16 LAB — URINALYSIS, COMPLETE
Bacteria: NONE SEEN
Blood: NEGATIVE
Glucose,UR: NEGATIVE mg/dL (ref 0–75)
Ketone: NEGATIVE
Leukocyte Esterase: NEGATIVE
Protein: NEGATIVE
Specific Gravity: 1.019 (ref 1.003–1.030)
WBC UR: <1 /HPF (ref 0–5)

## 2012-11-16 LAB — CBC
HCT: 33.5 % — ABNORMAL LOW (ref 35.0–47.0)
MCH: 29.6 pg (ref 26.0–34.0)
MCHC: 33.3 g/dL (ref 32.0–36.0)
MCV: 89 fL (ref 80–100)
RBC: 3.77 10*6/uL — ABNORMAL LOW (ref 3.80–5.20)
RDW: 14.9 % — ABNORMAL HIGH (ref 11.5–14.5)

## 2012-11-17 LAB — CBC WITH DIFFERENTIAL/PLATELET
Eosinophil #: 0.5 10*3/uL (ref 0.0–0.7)
HCT: 31.7 % — ABNORMAL LOW (ref 35.0–47.0)
MCV: 89 fL (ref 80–100)
Monocyte %: 10.3 %
Neutrophil #: 7.2 10*3/uL — ABNORMAL HIGH (ref 1.4–6.5)
Neutrophil %: 65.9 %
RDW: 15 % — ABNORMAL HIGH (ref 11.5–14.5)

## 2012-11-20 LAB — CREATININE, SERUM
Creatinine: 1.13 mg/dL (ref 0.60–1.30)
EGFR (African American): 52 — ABNORMAL LOW

## 2012-11-21 LAB — CULTURE, BLOOD (SINGLE)

## 2012-11-28 ENCOUNTER — Telehealth: Payer: Self-pay

## 2012-11-28 MED ORDER — OXYCODONE-ACETAMINOPHEN 5-325 MG PO TABS: 1.0000 | ORAL_TABLET | ORAL | Status: DC | PRN

## 2012-11-28 NOTE — Telephone Encounter (Signed)
She was d/c from hosp on it and needs to have on hand until she sees ortho Px printed for pick up in IN box

## 2012-11-28 NOTE — Telephone Encounter (Signed)
[place] left v/m requesting written rx faxed to [facility] [phone] is fax #. Oxycodone apap 5-325 mg taking one tab by mouth q 4 hours prn pain. I do not see on pts med list.

## 2012-11-29 ENCOUNTER — Encounter: Payer: Self-pay | Admitting: Family Medicine

## 2012-11-29 NOTE — Telephone Encounter (Signed)
Rx faxed

## 2012-12-11 ENCOUNTER — Telehealth: Payer: Self-pay

## 2012-12-11 NOTE — Telephone Encounter (Signed)
Someone called from I think a mail order pharmacy; very broken english;asked them to fax request to [phone].

## 2012-12-17 ENCOUNTER — Other Ambulatory Visit: Payer: Self-pay | Admitting: *Deleted

## 2012-12-17 NOTE — Telephone Encounter (Signed)
Received a fax from [employer] requesting DM supplies, please advise

## 2012-12-17 NOTE — Telephone Encounter (Signed)
It should have been lancets not needles/syringes, I changed it, please advise

## 2012-12-17 NOTE — Telephone Encounter (Signed)
Ok, thanks - can refill for the year

## 2012-12-17 NOTE — Telephone Encounter (Signed)
I do know know why she would need needles/ syringes--I do not think she is on insulin - please clarify just in case I am wrong  You may need to speak to someone at her residence as her memory is poor thanks

## 2012-12-18 MED ORDER — CARESENS N VOICE SYSTEM DEVI: Status: AC

## 2012-12-18 MED ORDER — GLUCOSE BLOOD VI STRP: ORAL_STRIP | Status: DC

## 2012-12-18 MED ORDER — CARESENS CONTROL A VI SOLN: Status: AC

## 2012-12-18 MED ORDER — LITE TOUCH LANCETS MISC: Status: AC

## 2012-12-18 NOTE — Telephone Encounter (Signed)
done

## 2012-12-19 ENCOUNTER — Emergency Department: Payer: Self-pay | Admitting: Internal Medicine

## 2012-12-19 LAB — COMPREHENSIVE METABOLIC PANEL
Alkaline Phosphatase: 110 U/L
Anion Gap: 5 — ABNORMAL LOW (ref 7–16)
Bilirubin,Total: 0.2 mg/dL (ref 0.2–1.0)
Calcium, Total: 9.2 mg/dL (ref 8.5–10.1)
Chloride: 104 mmol/L (ref 98–107)
EGFR (African American): >60
EGFR (Non-African Amer.): 55 — ABNORMAL LOW
Osmolality: 276 (ref 275–301)
SGOT(AST): 22 U/L (ref 15–37)
SGPT (ALT): 23 U/L (ref 12–78)
Sodium: 137 mmol/L (ref 136–145)
Total Protein: 7.7 g/dL (ref 6.4–8.2)

## 2012-12-19 LAB — CBC
HGB: 11.9 g/dL — ABNORMAL LOW (ref 12.0–16.0)
MCHC: 32.6 g/dL (ref 32.0–36.0)
MCV: 88 fL (ref 80–100)
Platelet: 237 10*3/uL (ref 150–440)
RBC: 4.15 10*6/uL (ref 3.80–5.20)
RDW: 15.2 % — ABNORMAL HIGH (ref 11.5–14.5)

## 2012-12-19 LAB — TROPONIN I
Troponin-I: <0.02 ng/mL
Troponin-I: <0.02 ng/mL

## 2012-12-21 LAB — PATHOLOGY REPORT

## 2013-01-14 ENCOUNTER — Other Ambulatory Visit: Payer: Self-pay | Admitting: Family Medicine

## 2013-01-14 MED ORDER — OXYCODONE-ACETAMINOPHEN 5-325 MG PO TABS: 1.0000 | ORAL_TABLET | ORAL | Status: DC | PRN

## 2013-01-14 NOTE — Telephone Encounter (Signed)
Med Tech with [facility] is requesting refill of pt's pain Rx, they request Rx faxed to [facility] (f) [phone]  Also Med Tech wanted to know if pt should be seen, pt is complaining of worsening back pain, pt had a procedure about 2 months ago and it resolved her pain to the point pt didn't even need to take pain med, but now pain is back and worse then before and pt is having to take pain med almost every 4-6 hours, please advise

## 2013-01-14 NOTE — Telephone Encounter (Signed)
Please have her make an appt with her orthopedist (I think she goes to [facility]) - for pain that is increasing (looks like she had a kyphoplasty) Px printed for pick up in IN box

## 2013-01-14 NOTE — Telephone Encounter (Signed)
Rx faxed and Med Tech notified to have pt f/u with Ortho regarding her back pain

## 2013-01-28 ENCOUNTER — Ambulatory Visit: Payer: Self-pay | Admitting: Orthopedic Surgery

## 2013-03-23 ENCOUNTER — Emergency Department: Payer: Self-pay | Admitting: Emergency Medicine

## 2013-03-23 LAB — BASIC METABOLIC PANEL
Anion Gap: 6 — ABNORMAL LOW (ref 7–16)
BUN: 16 mg/dL (ref 7–18)
CALCIUM: 8.8 mg/dL (ref 8.5–10.1)
CREATININE: 1.05 mg/dL (ref 0.60–1.30)
Chloride: 106 mmol/L (ref 98–107)
Co2: 23 mmol/L (ref 21–32)
EGFR (African American): 57 — ABNORMAL LOW
GFR CALC NON AF AMER: 49 — AB
Glucose: 114 mg/dL — ABNORMAL HIGH (ref 65–99)
OSMOLALITY: 272 (ref 275–301)
Potassium: 4.3 mmol/L (ref 3.5–5.1)
Sodium: 135 mmol/L — ABNORMAL LOW (ref 136–145)

## 2013-03-23 LAB — CBC
HCT: 37.4 % (ref 35.0–47.0)
HGB: 12.5 g/dL (ref 12.0–16.0)
MCH: 30.3 pg (ref 26.0–34.0)
MCHC: 33.4 g/dL (ref 32.0–36.0)
MCV: 91 fL (ref 80–100)
PLATELETS: 234 10*3/uL (ref 150–440)
RBC: 4.11 10*6/uL (ref 3.80–5.20)
RDW: 15.4 % — ABNORMAL HIGH (ref 11.5–14.5)
WBC: 9.4 10*3/uL (ref 3.6–11.0)

## 2013-03-25 ENCOUNTER — Telehealth: Payer: Self-pay

## 2013-03-25 NOTE — Telephone Encounter (Signed)
Courtney Castillo called for refill of Norco;Dr Collene Mares had previously prescribed Norco. Courtney Castillo will contact Dr Collene Mares for refill.

## 2013-03-27 ENCOUNTER — Ambulatory Visit: Payer: Self-pay | Admitting: Orthopedic Surgery

## 2013-03-27 LAB — CBC WITH DIFFERENTIAL/PLATELET
BASOS PCT: 2.2 %
Basophil #: 0.2 10*3/uL — ABNORMAL HIGH (ref 0.0–0.1)
Eosinophil #: 0.2 10*3/uL (ref 0.0–0.7)
Eosinophil %: 1.7 %
HCT: 35.2 % (ref 35.0–47.0)
HGB: 11.8 g/dL — ABNORMAL LOW (ref 12.0–16.0)
LYMPHS PCT: 22.4 %
Lymphocyte #: 2.1 10*3/uL (ref 1.0–3.6)
MCH: 30.6 pg (ref 26.0–34.0)
MCHC: 33.5 g/dL (ref 32.0–36.0)
MCV: 91 fL (ref 80–100)
Monocyte #: 0.8 x10 3/mm (ref 0.2–0.9)
Monocyte %: 8.7 %
NEUTROS PCT: 65 %
Neutrophil #: 6.1 10*3/uL (ref 1.4–6.5)
PLATELETS: 232 10*3/uL (ref 150–440)
RBC: 3.86 10*6/uL (ref 3.80–5.20)
RDW: 15.4 % — AB (ref 11.5–14.5)
WBC: 9.4 10*3/uL (ref 3.6–11.0)

## 2013-03-27 LAB — BASIC METABOLIC PANEL
ANION GAP: 2 — AB (ref 7–16)
BUN: 13 mg/dL (ref 7–18)
CALCIUM: 9.1 mg/dL (ref 8.5–10.1)
CHLORIDE: 105 mmol/L (ref 98–107)
CREATININE: 0.89 mg/dL (ref 0.60–1.30)
Co2: 30 mmol/L (ref 21–32)
GFR CALC NON AF AMER: 60 — AB
Glucose: 84 mg/dL (ref 65–99)
OSMOLALITY: 273 (ref 275–301)
Potassium: 4.2 mmol/L (ref 3.5–5.1)
Sodium: 137 mmol/L (ref 136–145)

## 2013-03-28 ENCOUNTER — Ambulatory Visit: Payer: Self-pay | Admitting: Orthopedic Surgery

## 2013-03-29 LAB — PATHOLOGY REPORT

## 2013-04-26 ENCOUNTER — Ambulatory Visit: Payer: Self-pay | Admitting: Orthopedic Surgery

## 2013-05-13 IMAGING — CR DG KNEE 1-2V*L*
2 series · 2 of 2 positions shown · non-contrast
Comparison: Left knee radiographs performed 06/01/2004

CLINICAL DATA: Left knee pain.

LEFT KNEE - 1-2 VIEW

[x knee ap left]
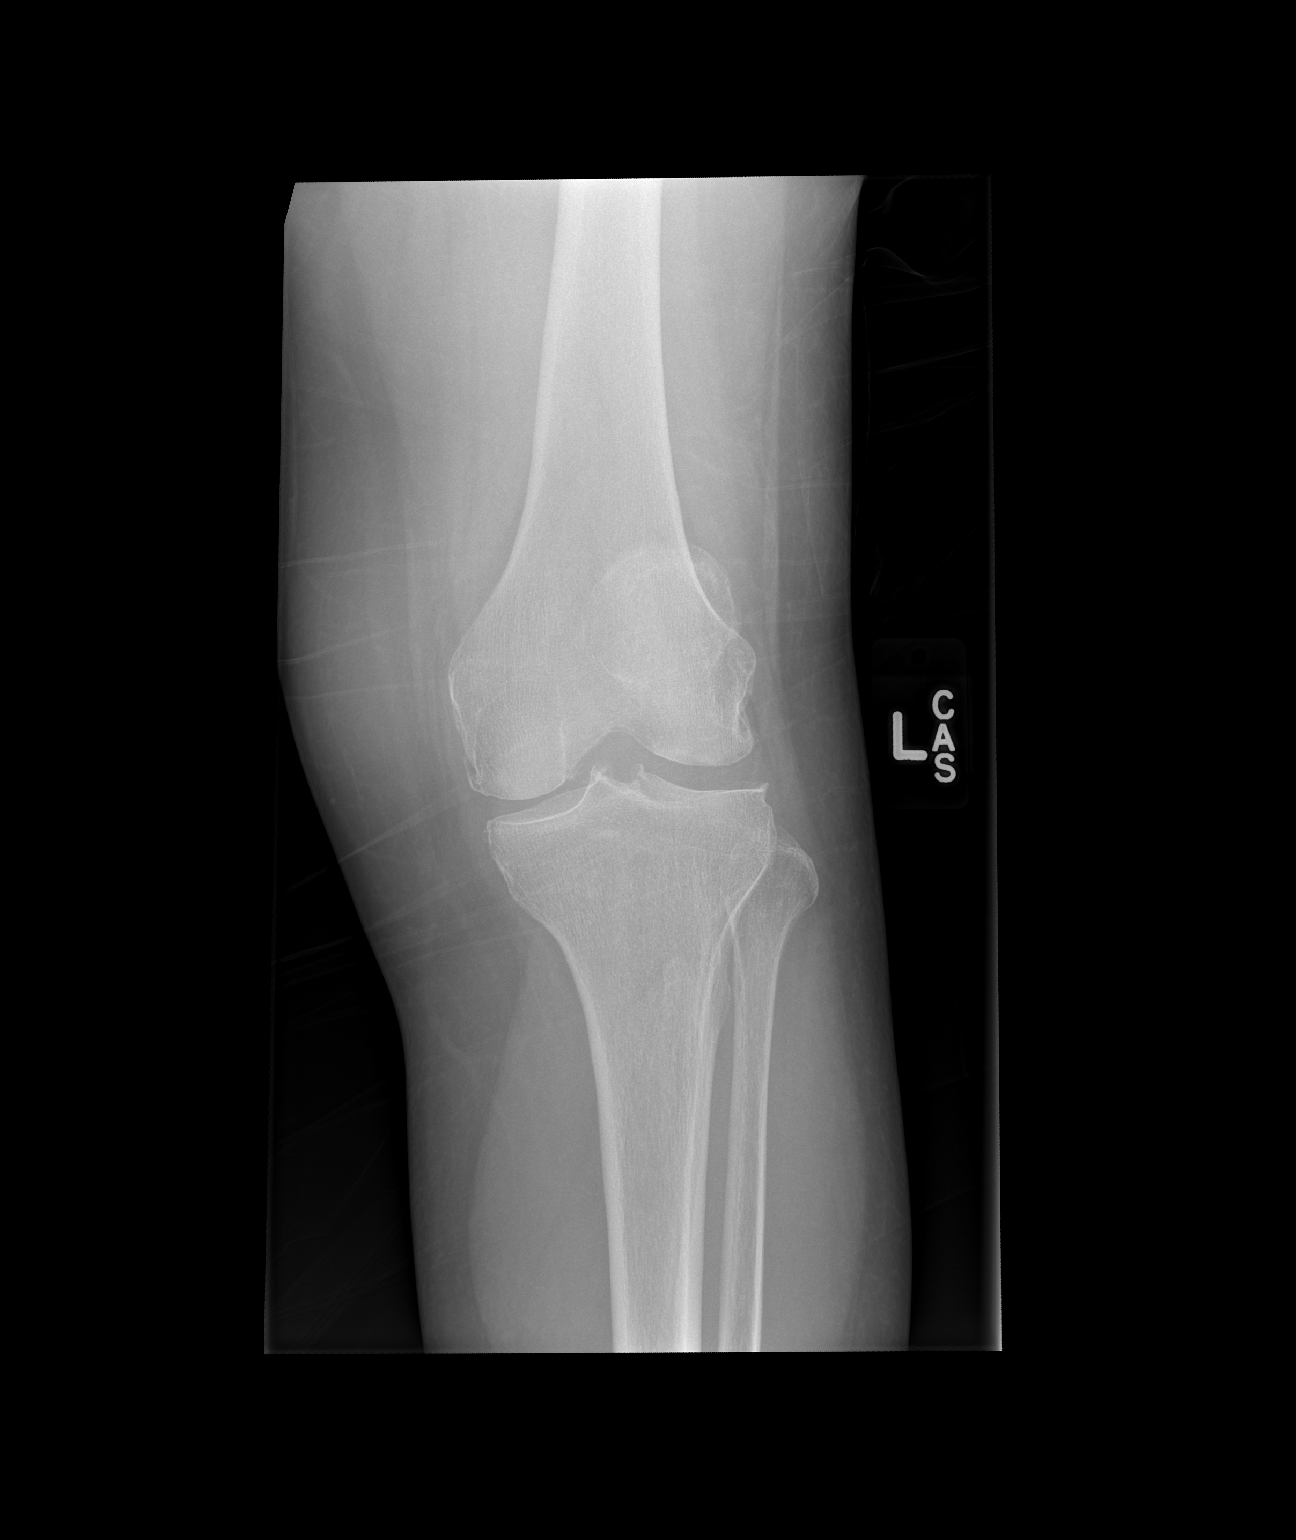

[x knee lat left]
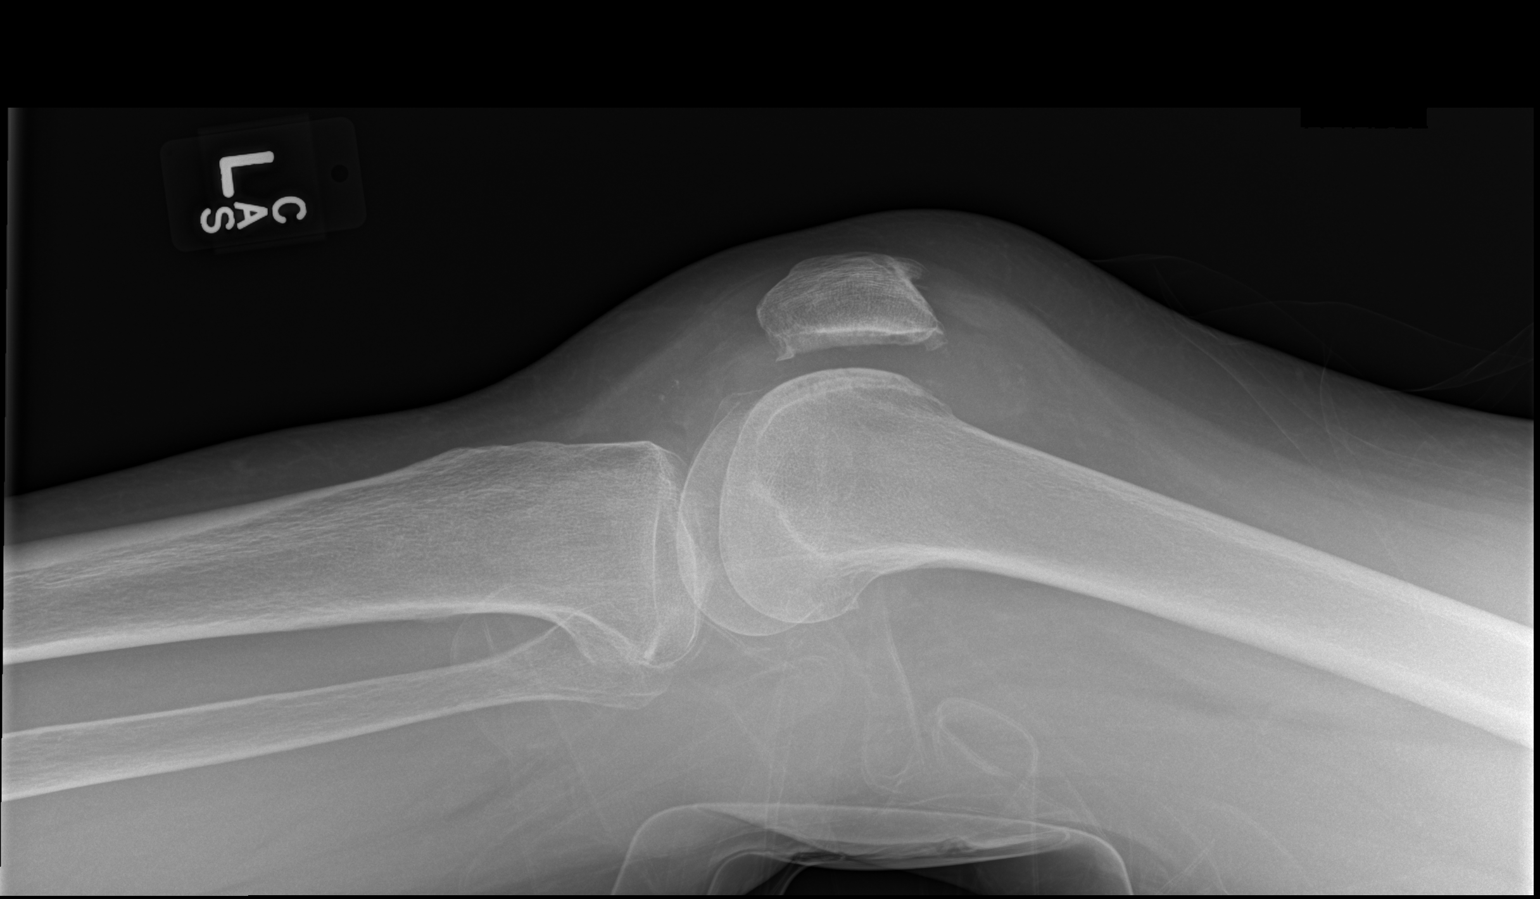

[2 of 2 positions shown; findings below may reference images not displayed]

FINDINGS: There is no evidence of fracture or dislocation.  The
joint spaces are preserved.  Small marginal osteophytes are noted
arising at all three compartments, and at the tibial spine.  Mild
sclerotic change is noted at the lateral femoral condyle.

A small joint effusion is seen.  The visualized soft tissues are
otherwise unremarkable in appearance.
IMPRESSION: 1.  No evidence of fracture or dislocation.
2.  Mild osteoarthritis at the left knee.
3.  Small joint effusion seen.

## 2013-05-14 IMAGING — CR DG CHEST 1V PORT
1 series · 1 of 1 positions shown · non-contrast
Comparison: Chest radiograph performed 05/12/2010

CLINICAL DATA: Shortness of breath and leukocytosis.

PORTABLE CHEST - 1 VIEW

[AP]
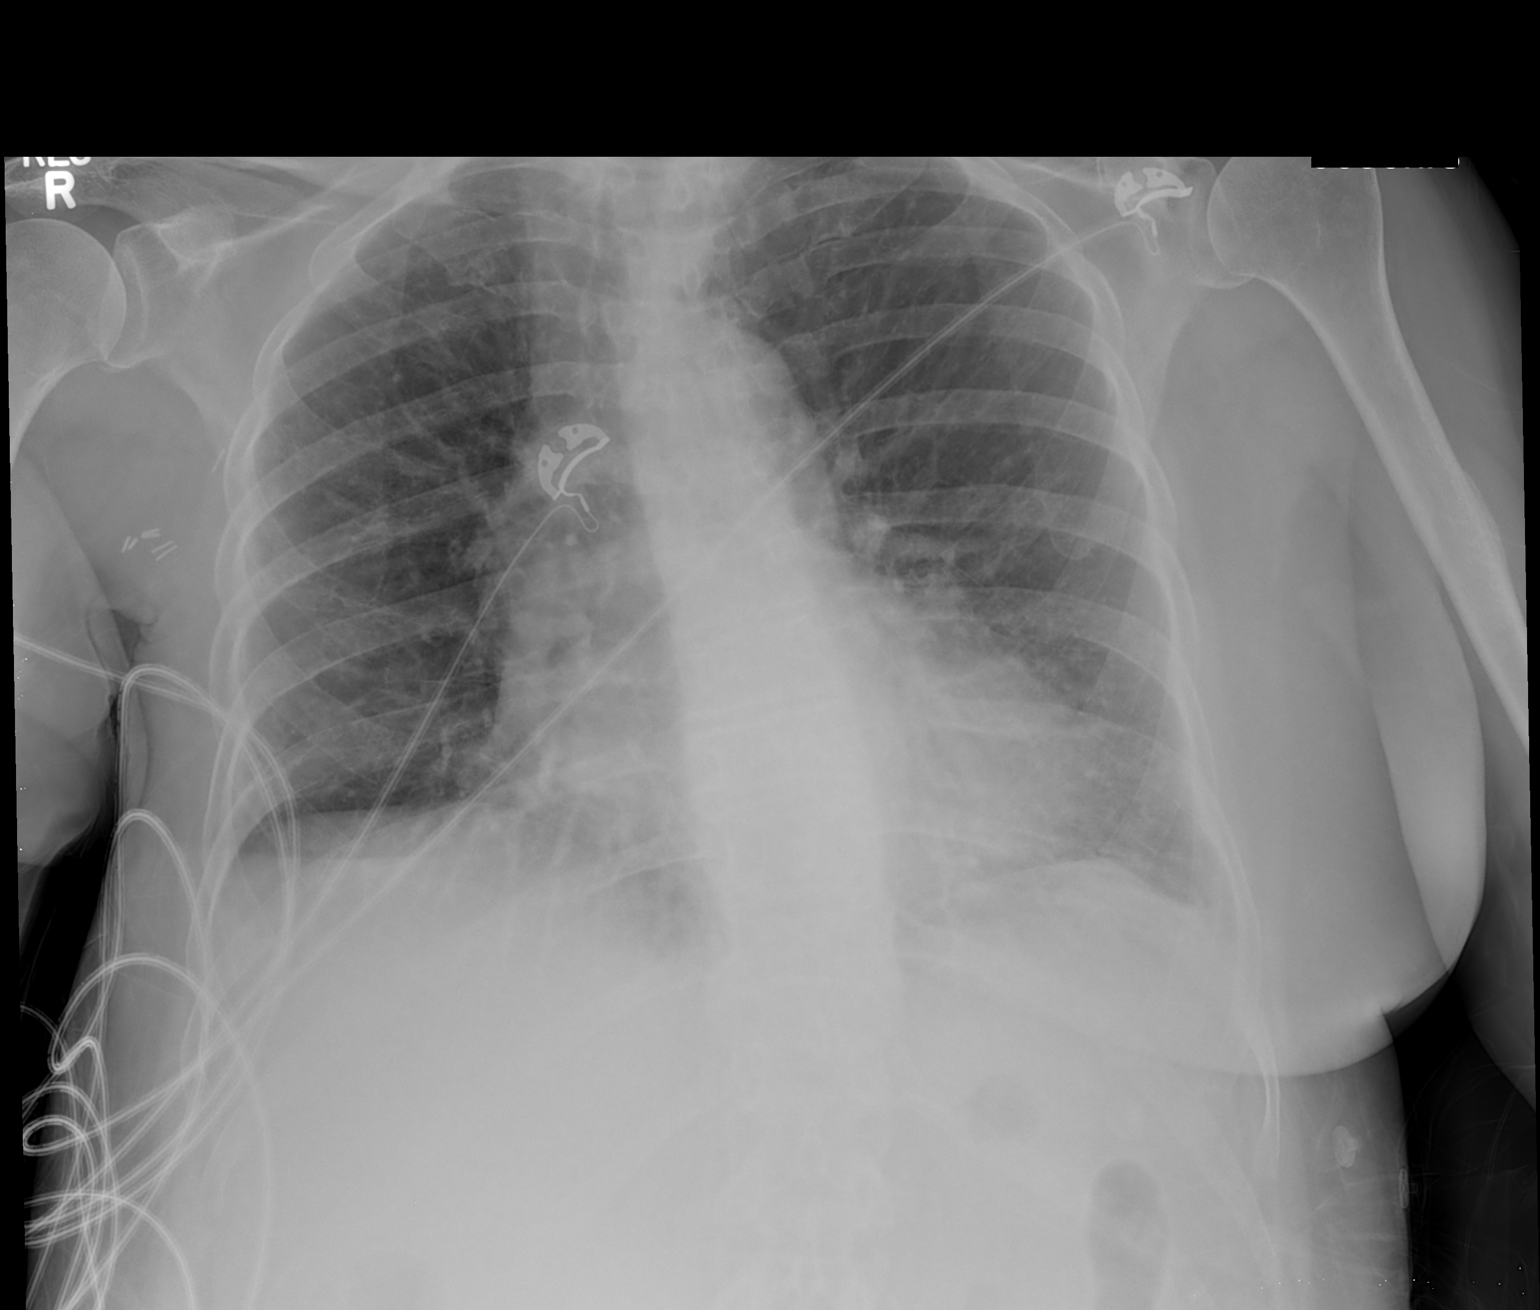

[1 of 1 positions shown; findings below may reference images not displayed]

FINDINGS: The lungs are well-aerated.  Vascular congestion is
noted, with mildly increased interstitial markings, more prominent
on the left, raising concern for minimal interstitial edema.
Pneumonia could conceivably have a similar appearance.  There is no
evidence of pleural effusion or pneumothorax.

The cardiomediastinal silhouette is borderline enlarged.  No acute
osseous abnormalities are seen.  Clips are noted overlying the
right axilla.
IMPRESSION: Vascular congestion and borderline cardiomegaly, with mildly
increased interstitial markings, more prominent on the left,
raising concern for minimal interstitial edema.  Pneumonia could
conceivably have a similar appearance.

## 2013-05-17 IMAGING — CR DG CHEST 2V
2 series · 2 of 2 positions shown · non-contrast
Comparison: 12/21/2010

CLINICAL DATA: Rule out pneumonia

CHEST - 2 VIEW

[w chest pa]
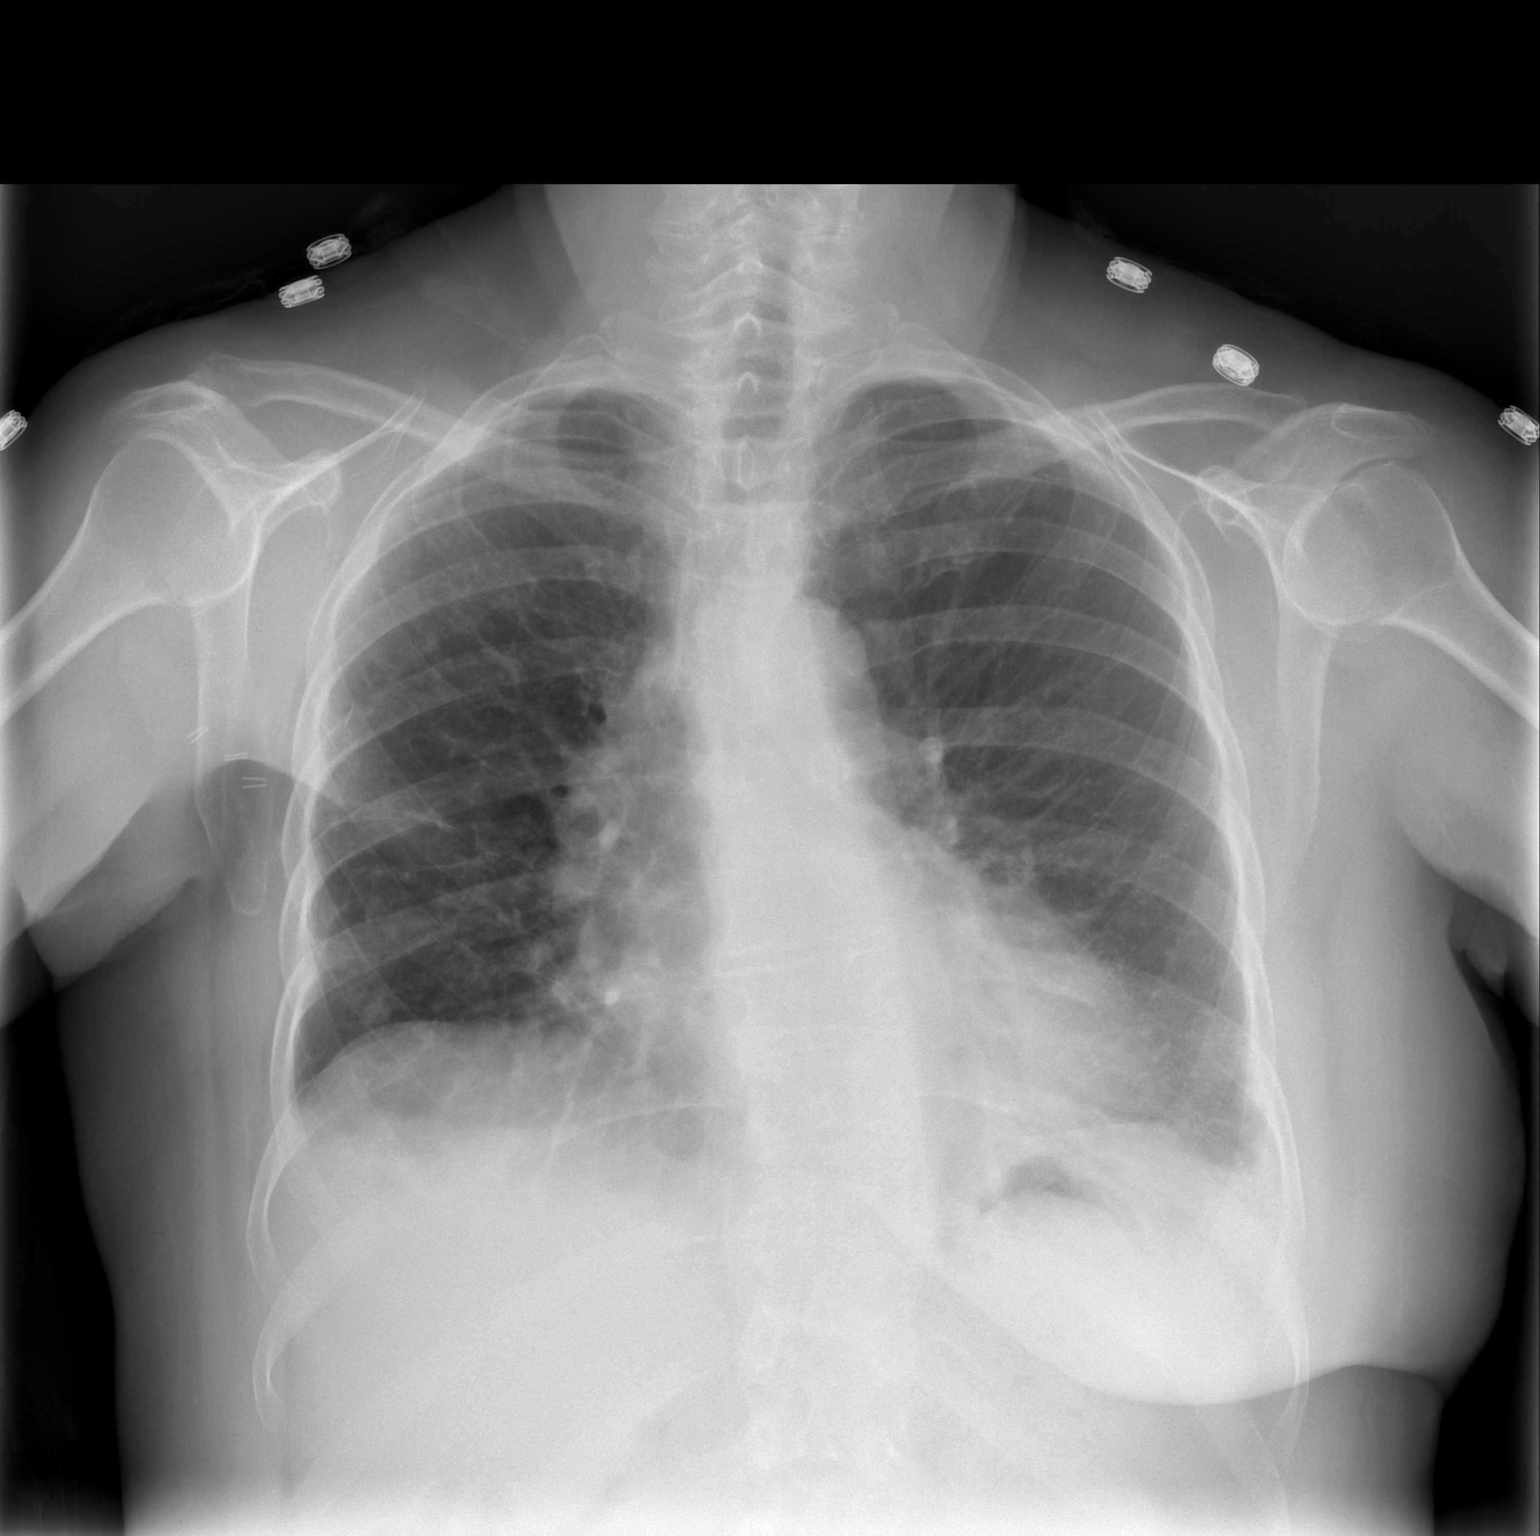

[w chest lat]
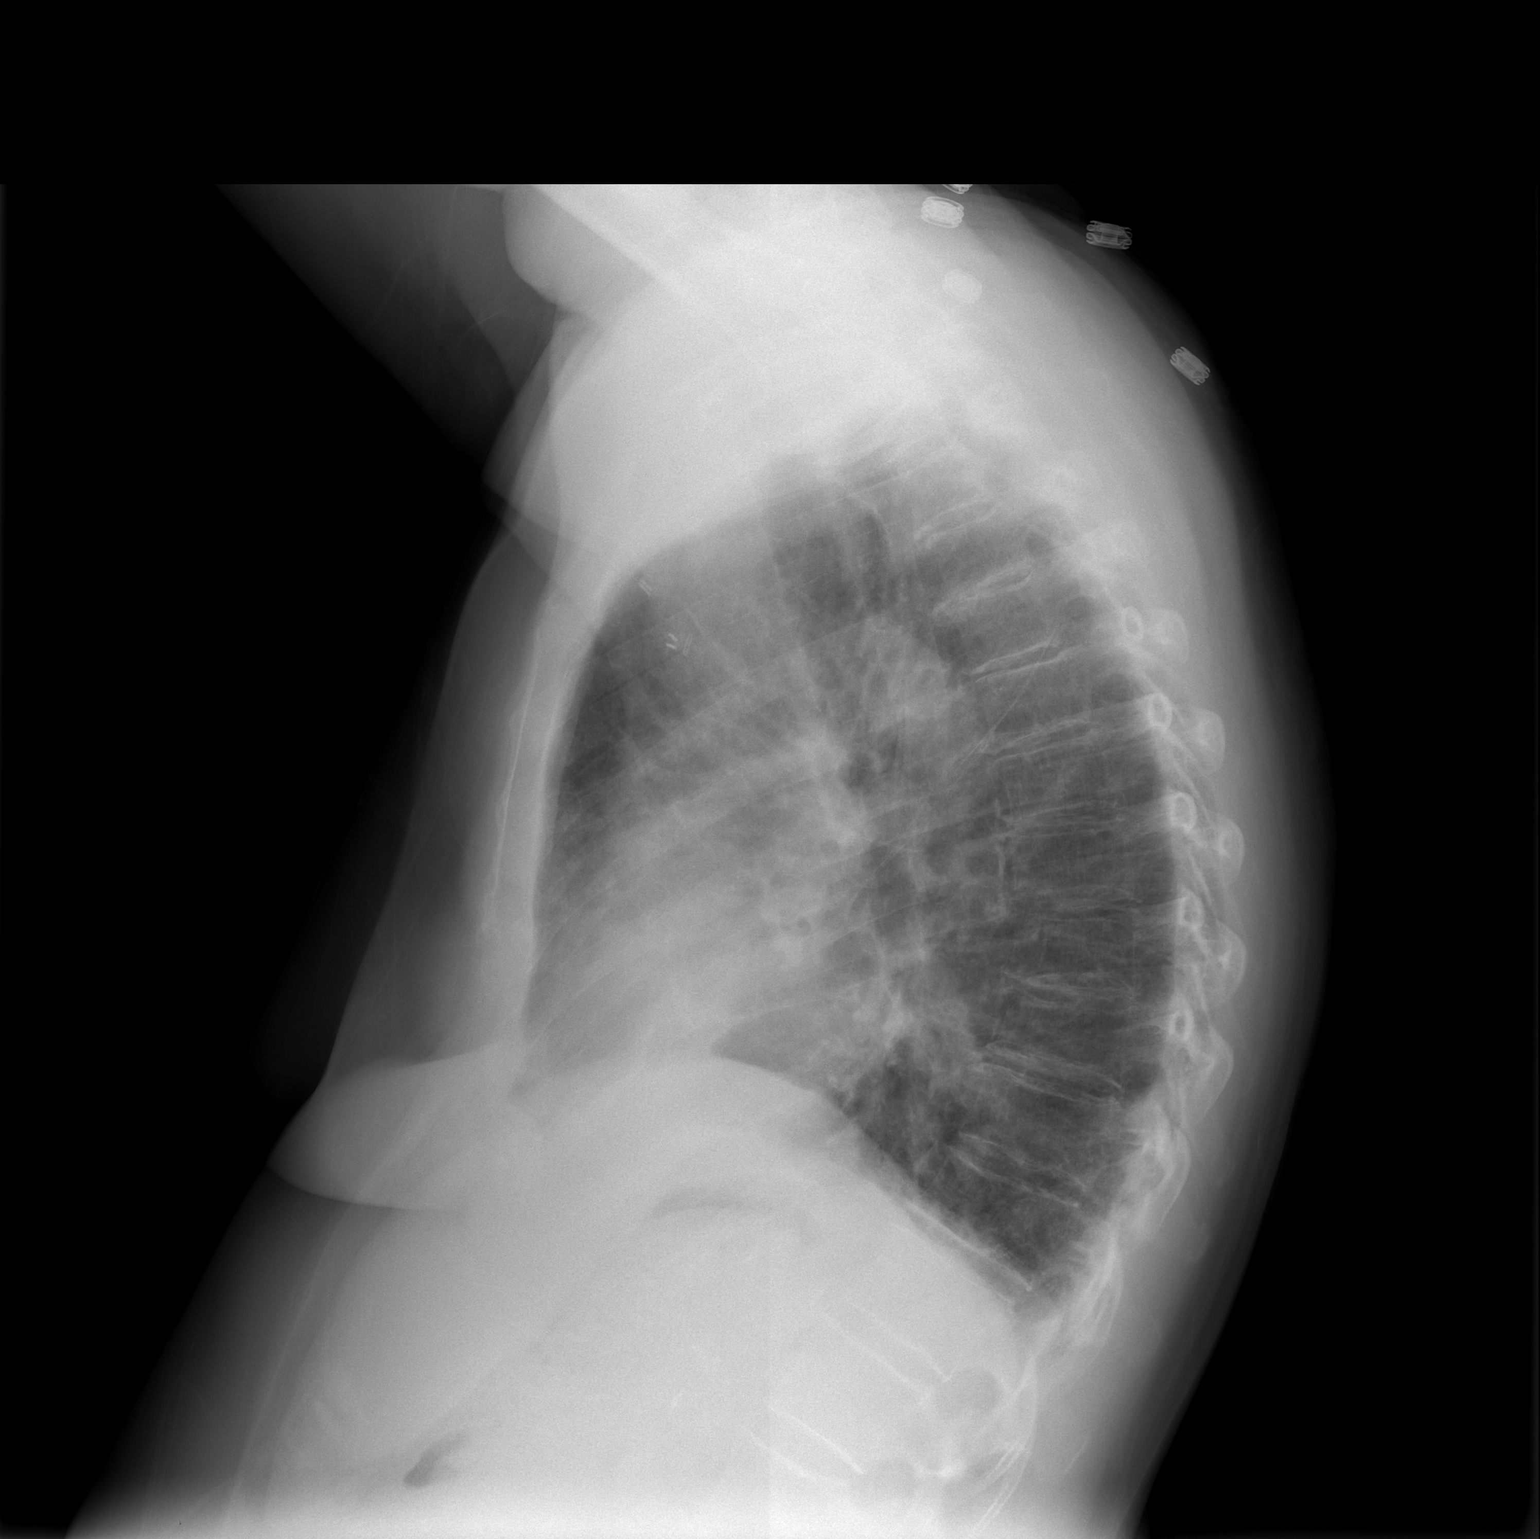

[2 of 2 positions shown; findings below may reference images not displayed]

FINDINGS: The heart and mediastinal contours are stable.  Increased
interstitial markings are less apparent on today's exam. Increased
linear markings in the posterior segment of the left lower lobe are
associated with posterior retraction of the major fissure
suggesting this is likely due to atelectasis rather than an area of
focal bronchopneumonia.  This appears unchanged in comparison with
prior exam.  No other focal infiltrates or signs of congestive
failure seen.

Bony structures appear intact.
IMPRESSION: Findings most compatible with left lower lobe volume loss.
Improvement in interstitial prominence suggests interval
improvement in interstitial edema.

## 2013-05-27 NOTE — Addendum Note (Signed)
Addended by: Tammi Sou on: 05/27/2013 04:56 PM   Modules accepted: Orders, Medications

## 2013-08-08 ENCOUNTER — Other Ambulatory Visit: Payer: Self-pay | Admitting: Family Medicine

## 2013-08-08 NOTE — Telephone Encounter (Signed)
Please refill times 3 and schedule her an appt in early fall (she will need a reminder prior to visit as well since she tends to forget easily and has no family close to remind her) Thanks

## 2013-08-08 NOTE — Telephone Encounter (Signed)
Electronic refill request, no recent/future appt., please advise  

## 2013-08-08 NOTE — Telephone Encounter (Signed)
Called pt's home # and POA [name] said we would have to set up appt. through [facility] because that's where pt lives. [facility] and they gave me Transportation # [phone] to call and set up appt., called and no one answered so left voicemail requesting them to call me back to get appt scheduled  Med refilled

## 2013-08-13 ENCOUNTER — Other Ambulatory Visit: Payer: Self-pay | Admitting: Family Medicine

## 2013-08-20 NOTE — Telephone Encounter (Addendum)
Tiffany from [facility] left a vm on my phone stating that she was returning my call.  When I spoke to her, she said she was given my extension.  I saw that [place] had called and scheduled a follow up appt for her in early fall per Dr. Marliss Coots instructions.

## 2013-09-25 ENCOUNTER — Ambulatory Visit: Payer: Medicare Other | Admitting: Family Medicine

## 2013-09-27 ENCOUNTER — Encounter: Payer: Self-pay | Admitting: Family Medicine

## 2013-09-27 ENCOUNTER — Ambulatory Visit (INDEPENDENT_AMBULATORY_CARE_PROVIDER_SITE_OTHER): Payer: Medicare Other | Admitting: Family Medicine

## 2013-09-27 VITALS — BP 138/72 | HR 86 | Temp 98.2°F | Ht 60.0 in | Wt 149.8 lb

## 2013-09-27 DIAGNOSIS — Z23 Encounter for immunization: Secondary | ICD-10-CM

## 2013-09-27 DIAGNOSIS — E785 Hyperlipidemia, unspecified: Secondary | ICD-10-CM

## 2013-09-27 DIAGNOSIS — F32A Depression, unspecified: Secondary | ICD-10-CM

## 2013-09-27 DIAGNOSIS — G8929 Other chronic pain: Secondary | ICD-10-CM

## 2013-09-27 DIAGNOSIS — F329 Major depressive disorder, single episode, unspecified: Secondary | ICD-10-CM

## 2013-09-27 DIAGNOSIS — F0390 Unspecified dementia without behavioral disturbance: Secondary | ICD-10-CM

## 2013-09-27 DIAGNOSIS — M549 Dorsalgia, unspecified: Secondary | ICD-10-CM

## 2013-09-27 DIAGNOSIS — F3289 Other specified depressive episodes: Secondary | ICD-10-CM

## 2013-09-27 DIAGNOSIS — E119 Type 2 diabetes mellitus without complications: Secondary | ICD-10-CM

## 2013-09-27 DIAGNOSIS — I10 Essential (primary) hypertension: Secondary | ICD-10-CM

## 2013-09-27 LAB — CBC WITH DIFFERENTIAL/PLATELET
BASOS ABS: 0.1 10*3/uL (ref 0.0–0.1)
Basophils Relative: 0.9 % (ref 0.0–3.0)
Eosinophils Absolute: 0.3 10*3/uL (ref 0.0–0.7)
Eosinophils Relative: 3.3 % (ref 0.0–5.0)
HCT: 37.7 % (ref 36.0–46.0)
HEMOGLOBIN: 12.5 g/dL (ref 12.0–15.0)
LYMPHS PCT: 24 % (ref 12.0–46.0)
Lymphs Abs: 2.3 10*3/uL (ref 0.7–4.0)
MCHC: 33.2 g/dL (ref 30.0–36.0)
MCV: 87.9 fl (ref 78.0–100.0)
Monocytes Absolute: 1 10*3/uL (ref 0.1–1.0)
Monocytes Relative: 10.5 % (ref 3.0–12.0)
NEUTROS ABS: 5.8 10*3/uL (ref 1.4–7.7)
Neutrophils Relative %: 61.3 % (ref 43.0–77.0)
PLATELETS: 293 10*3/uL (ref 150.0–400.0)
RBC: 4.29 Mil/uL (ref 3.87–5.11)
RDW: 15.4 % (ref 11.5–15.5)
WBC: 9.4 10*3/uL (ref 4.0–10.5)

## 2013-09-27 LAB — LIPID PANEL
CHOL/HDL RATIO: 6
Cholesterol: 182 mg/dL (ref 0–200)
HDL: 28.2 mg/dL — ABNORMAL LOW (ref >39.00)
NONHDL: 153.8
Triglycerides: 215 mg/dL — ABNORMAL HIGH (ref 0.0–149.0)
VLDL: 43 mg/dL — ABNORMAL HIGH (ref 0.0–40.0)

## 2013-09-27 LAB — COMPREHENSIVE METABOLIC PANEL
ALBUMIN: 3.6 g/dL (ref 3.5–5.2)
ALT: 19 U/L (ref 0–35)
AST: 24 U/L (ref 0–37)
Alkaline Phosphatase: 87 U/L (ref 39–117)
BUN: 18 mg/dL (ref 6–23)
CALCIUM: 9.2 mg/dL (ref 8.4–10.5)
CO2: 28 meq/L (ref 19–32)
Chloride: 103 mEq/L (ref 96–112)
Creatinine, Ser: 1.2 mg/dL (ref 0.4–1.2)
GFR: 46.8 mL/min — AB (ref >60.00)
Glucose, Bld: 103 mg/dL — ABNORMAL HIGH (ref 70–99)
POTASSIUM: 4 meq/L (ref 3.5–5.1)
SODIUM: 137 meq/L (ref 135–145)
TOTAL PROTEIN: 7.9 g/dL (ref 6.0–8.3)
Total Bilirubin: 0.3 mg/dL (ref 0.2–1.2)

## 2013-09-27 LAB — LDL CHOLESTEROL, DIRECT: Direct LDL: 138.7 mg/dL

## 2013-09-27 LAB — HEMOGLOBIN A1C: Hgb A1c MFr Bld: 6.9 % — ABNORMAL HIGH (ref 4.6–6.5)

## 2013-09-27 LAB — TSH: TSH: 1.17 u[IU]/mL (ref 0.35–4.50)

## 2013-09-27 MED ORDER — HYDROCODONE-ACETAMINOPHEN 5-325 MG PO TABS: 1.0000 | ORAL_TABLET | Freq: Two times a day (BID) | ORAL | Status: DC | PRN

## 2013-09-27 NOTE — Progress Notes (Signed)
Pre visit review using our clinic review tool, if applicable. No additional management support is needed unless otherwise documented below in the visit note. 

## 2013-09-27 NOTE — Assessment & Plan Note (Signed)
Due for A1C 10 lb wt loss-pt states appetite is good and she eats at her residence/regular meals  More concerned about nutrition now  No meds currently for DM

## 2013-09-27 NOTE — Patient Instructions (Signed)
Labs today Flu shot today  Use pain medicine only when you really need it  Use your walker at all times to prevent falls Call us if you decide that you want to schedule a mammogram

## 2013-09-27 NOTE — Assessment & Plan Note (Signed)
Lab today  Diet controlled at her residence  No meds

## 2013-09-27 NOTE — Assessment & Plan Note (Signed)
Overall stable on aricept and namenda  Pt able to follow conversation today and mood is good  She enjoys living at [place] as well

## 2013-09-27 NOTE — Assessment & Plan Note (Signed)
bp in fair control at this time  BP Readings from Last 1 Encounters:  09/27/13 138/72   No changes needed Disc lifstyle change with low sodium diet and exercise  On losartan now and doing well  Lab today

## 2013-09-27 NOTE — Assessment & Plan Note (Signed)
Sertraline helps -will continue this  Her mood is very good today  Socialization has helped also

## 2013-09-27 NOTE — Progress Notes (Signed)
Subjective:    Patient ID: Courtney Castillo, female    DOB: 20-Jun-1927, 80 y.o.   MRN: [id number]  HPI Here for f/u of chronic medical problems and also due for lab work   Had a good summer Gets out when she can  Likes to [place] [employer]  Feels lousy at times No falls recently  She does use her walker all the time  Does not see family very often  She has not seen Environmental consultant    Lives at [facility] - still likes it  Here by herself today Note from residence - needs pain med - for chronic back and leg pain - per pt she has good days and bad days  Used to have hydrocodone- acet 10-325 From Dr Trevor Iha is down 10 lb - she eats " what they put in front of her"  She eats socially  This tends to fluctuate   bp is stable today  No cp or palpitations or headaches or edema  No side effects to medicines  BP Readings from Last 3 Encounters:  09/27/13 138/72  08/15/12 138/78  02/13/12 136/58      Due for A1C for DM Lab Results  Component Value Date   HGBA1C 6.7* 08/15/2012   no meds- diet control   Hyperlipidemia Diet control Lab Results  Component Value Date   CHOL 221* 08/15/2012   HDL 26.70* 08/15/2012   LDLCALC 88 12/22/2010   LDLDIRECT 136.2 08/15/2012   TRIG 346.0* 08/15/2012   CHOLHDL 8 08/15/2012     Memory - she does not noticing a big decline herself but that is hard to tell- knows she has memory loss/dementia  Mood is generally fairly positive - she thinks stable   Has hx of breast cancer  Declines further mammograms or breast cancer screening at this time   Patient Active Problem List   Diagnosis Date Noted  . Chronic back pain 09/27/2013  . History of fall 08/15/2012  . Laceration of face 08/15/2012  . Skin tear of upper arm without complication 08/65/7846  . Gum laceration 08/17/2011  . Aphthous ulcer of mouth 08/17/2011  . Overactive bladder 04/06/2011  . Muscle weakness (generalized) 02/21/2011  . Depression (emotion) 02/21/2011  . Pain  in both knees 12/21/2010  . Knee pain 07/26/2010  . Chest wall pain 05/12/2010  . Breast cancer 05/12/2010  . Back pain 05/12/2010  . ANEMIA 09/26/2007  . Dementia 09/17/2007  . DIZZINESS 02/20/2007  . POLYMYALGIA RHEUMATICA 08/22/2006  . ARTHROPATHY NOS, MULTIPLE SITES 07/19/2006  . DIABETES MELLITUS, TYPE II 07/11/2006  . HYPERLIPIDEMIA 07/11/2006  . CATARACTS 07/11/2006  . HYPERTENSION 07/11/2006  . ALLERGIC RHINITIS 07/11/2006  . REACTIVE AIRWAY DISEASE 07/11/2006  . GERD 07/11/2006  . DIVERTICULOSIS, COLON 07/11/2006  . OSTEOARTHRITIS 07/11/2006  . HX, PERSONAL, ARTHRITIS 07/10/2006  . Hx Breast cancer (IDC) Right, Stage one 12/18/1998    Class: Stage 1   Past Medical History  Diagnosis Date  . Diabetes mellitus     type II  . Diverticulosis of colon   . Hyperlipidemia   . Hypertension   . Arthritis     osteoarthritis knees  . GERD (gastroesophageal reflux disease)     hiatal hernia  . Allergy     allergic rhinitis/ vasometer  . Lumbar spinal stenosis 05/2007  . 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 Anemia   .  Cancer     Hx of breast CA & recurrent breast CA 11/11  . Dementia 2012  . Anxiety    Past Surgical History  Procedure Laterality Date  . Carpal tunnel release    . Eye surgery      cataract extraction  . Abdominal hysterectomy  03/1997    BSO stage 1B adenocarcinoma of endometruim  . Mastectomy w/ nodes partial  12/14/2009    IDC Right, on  . Incisional breast biopsy  10/16/2009  . Shoulder surgery    . Back surgery  2009  . Tympanoplasty  1996  . Mastectomy partial / lumpectomy  12/28/1998    NL lumpectomy  . Mastectomy partial / lumpectomy  01/07/2010    Re-excision for + margin   History  Substance Use Topics  . Smoking status: Never Smoker   . Smokeless tobacco: Never Used  . Alcohol Use: No   Family History  Problem Relation Age of Onset  . Diabetes Mother   . Heart disease Mother     MI   . Kidney disease Father   . Hypertension Father    Allergies  Allergen Reactions  . Sulfamethoxazole-Trimethoprim     REACTION: Unknown reaction it has been years since patient took it and she only knows it was a Sulfa drug she took.   Current Outpatient Prescriptions on File Prior to Visit  Medication Sig Dispense Refill  . aspirin 81 MG tablet Take 1 tablet (81 mg total) by mouth daily. with meal  30 tablet  11  . Blood Glucose Calibration (CARESENS CONTROL A) SOLN Used as directed  1 each  11  . Blood Glucose Monitoring Suppl ([place]) DEVI Use to check blood sugar once daily  1 Device  0  . CLEVER CHEK AUTO-CODE VOICE test strip TEST ONCE DAILY  100 each  2  . donepezil (ARICEPT) 10 MG tablet Take 1 tablet (10 mg total) by mouth at bedtime.  30 tablet  5  . memantine (NAMENDA) 5 MG tablet Take 1 tablet (5 mg total) by mouth 2 (two) times daily.  60 tablet  6  . Multiple Vitamin (MULTIVITAMIN) tablet Take 1 tablet by mouth daily.  30 tablet  11  . oxybutynin (DITROPAN-XL) 5 MG 24 hr tablet Take 5 mg by mouth daily.      . sertraline (ZOLOFT) 50 MG tablet Take 1 tablet (50 mg total) by mouth daily.  30 tablet  3  . travoprost, benzalkonium, (TRAVATAN) 0.004 % ophthalmic solution Place 1 drop into both eyes 2 (two) times daily.  2.5 mL  5  . acetaminophen (TYLENOL) 325 MG tablet Take 650 mg by mouth every 6 (six) hours as needed.      . Alum & Mag Hydroxide-Simeth (MAGIC MOUTHWASH) SOLN Take 5 mLs by mouth 3 (three) times daily. As needed for pain, swish and swallow  100 mL  0  . Calcium-Vitamin A-Vitamin D (LIQUID CALCIUM PO) Take 120 mLs by mouth 2 (two) times daily.      . feeding supplement (ENSURE IMMUNE HEALTH) LIQD Drink entire contents of 237 MLs [1-can] by mouth twice daily.  60 Bottle  11  . LITE TOUCH LANCETS MISC Test blood sugar once daily for DM 250.0  100 each  3  . meclizine (ANTIVERT) 25 MG tablet Take 0.5 tablets (12.5 mg total) by mouth 2 (two) times  daily as needed for dizziness.  30 tablet  5   No current facility-administered medications on file  prior to visit.    Review of Systems Review of Systems  Constitutional: Negative for fever, appetite change, fatigue and unexpected weight change.  Eyes: Negative for pain and visual disturbance.  Respiratory: Negative for cough and shortness of breath.   Cardiovascular: Negative for cp or palpitations    Gastrointestinal: Negative for nausea, diarrhea and constipation.  Genitourinary: Negative for urgency and frequency.  Skin: Negative for pallor or rash   Neurological: Negative for weakness, light-headedness, numbness and headaches.  Hematological: Negative for adenopathy. Does not bruise/bleed easily.  Psychiatric/Behavioral: Negative for dysphoric mood. Pos for anxiety at times , pos for memory loss/dementia         Objective:   Physical Exam  Constitutional: She appears well-developed and well-nourished. No distress.  HENT:  Head: Normocephalic and atraumatic.  Right Ear: External ear normal.  Left Ear: External ear normal.  Nose: Nose normal.  Mouth/Throat: Oropharynx is clear and moist.  Eyes: Conjunctivae and EOM are normal. Pupils are equal, round, and reactive to light. Right eye exhibits no discharge. Left eye exhibits no discharge. No scleral icterus.  Neck: Normal range of motion. Neck supple. No JVD present. Carotid bruit is not present. No thyromegaly present.  Cardiovascular: Normal rate, regular rhythm, normal heart sounds and intact distal pulses.  Exam reveals no gallop.   Pulmonary/Chest: Effort normal and breath sounds normal. No respiratory distress. She has no wheezes. She has no rales.  Abdominal: Soft. Bowel sounds are normal. She exhibits no distension and no mass. There is no tenderness.  Musculoskeletal: She exhibits no edema and no tenderness.  No lumbar spinal tenderness today  Fair rom of spine , limited rom of knees  No acute joint swelling     Lymphadenopathy:    She has no cervical adenopathy.  Neurological: She is alert. She has normal reflexes. No cranial nerve deficit. She exhibits normal muscle tone. Coordination normal.  Skin: Skin is warm and dry. No rash noted. No erythema. No pallor.  Psychiatric: She has a normal mood and affect. Cognition and memory are impaired. She exhibits abnormal recent memory.          Assessment & Plan:   Problem List Items Addressed This Visit     Cardiovascular and Mediastinum   HYPERTENSION - Primary      bp in fair control at this time  BP Readings from Last 1 Encounters:  09/27/13 138/72   No changes needed Disc lifstyle change with low sodium diet and exercise  On losartan now and doing well  Lab today     Relevant Medications      losartan (COZAAR) 50 MG tablet   Other Relevant Orders      CBC with Differential (Completed)      Comprehensive metabolic panel (Completed)      TSH (Completed)      Lipid panel (Completed)     Endocrine   DIABETES MELLITUS, TYPE II     Due for A1C 10 lb wt loss-pt states appetite is good and she eats at her residence/regular meals  More concerned about nutrition now  No meds currently for DM    Relevant Medications      losartan (COZAAR) 50 MG tablet   Other Relevant Orders      Hemoglobin A1c (Completed)     Nervous and Auditory   Dementia     Overall stable on aricept and namenda  Pt able to follow conversation today and mood is good  She enjoys living at  [facility] as well       Other   HYPERLIPIDEMIA     Lab today  Diet controlled at her residence  No meds      Relevant Medications      losartan (COZAAR) 50 MG tablet   Other Relevant Orders      Lipid panel (Completed)   Depression (emotion)     Sertraline helps -will continue this  Her mood is very good today  Socialization has helped also     Chronic back pain     norco 5-325 px - will monitor how much she needs to use  Read note from [facility] re: this   Prev on higher dose from Dr Rudene Christians  Want to prevent falls-disc using walker Pain was not bad today    Relevant Medications      NORCO 5-325 MG PO TABS    Other Visit Diagnoses   Need for prophylactic vaccination and inoculation against influenza        Relevant Orders       Flu Vaccine QUAD 36+ mos PF IM (Fluarix Quad PF) (Completed)

## 2013-09-27 NOTE — Assessment & Plan Note (Signed)
norco 5-325 px - will monitor how much she needs to use  Read note from [facility] re: this  Prev on higher dose from Dr Rudene Christians  Want to prevent falls-disc using walker Pain was not bad today

## 2013-10-04 ENCOUNTER — Telehealth: Payer: Self-pay | Admitting: Family Medicine

## 2013-10-04 MED ORDER — ATORVASTATIN CALCIUM 10 MG PO TABS: 10.0000 mg | ORAL_TABLET | Freq: Every day | ORAL | Status: AC

## 2013-10-04 NOTE — Telephone Encounter (Signed)
Message copied by Abner Greenspan on Fri Oct 04, 2013  2:04 PM ------      Message from: Tammi Sou      Created: Fri Oct 04, 2013 10:53 AM       Nephew notified of lab results and Dr. Marliss Coots comment. Nephew said they agree with starting Lipitor, pt is at [facility] and would need a paper Rx faxed to them before they could give her the medication ------

## 2013-10-04 NOTE — Telephone Encounter (Signed)
Thanks  Will print px -in IN box  Let me know if any side eff or problems Please check fasting lipid/ast/alt in about 6 weeks for hyperlipidemia

## 2013-10-09 NOTE — Telephone Encounter (Signed)
Rx sent to [facility] [place] notified she needs to f/u for labs in 6 weeks and order sent to [facility] to have pt come in for labs

## 2013-11-17 ENCOUNTER — Telehealth: Payer: Self-pay | Admitting: Family Medicine

## 2013-11-17 DIAGNOSIS — E785 Hyperlipidemia, unspecified: Secondary | ICD-10-CM

## 2013-11-17 NOTE — Telephone Encounter (Signed)
-----   Message from Ellamae Sia sent at 11/13/2013 11:55 AM EDT ----- Regarding: Lab orders for Monday, 11.2.15 Lab orders for a f/u appt

## 2013-11-18 ENCOUNTER — Other Ambulatory Visit (INDEPENDENT_AMBULATORY_CARE_PROVIDER_SITE_OTHER): Payer: Medicare Other

## 2013-11-18 DIAGNOSIS — E785 Hyperlipidemia, unspecified: Secondary | ICD-10-CM

## 2013-11-18 LAB — ALT: ALT: 15 U/L (ref 0–35)

## 2013-11-18 LAB — LIPID PANEL
Cholesterol: 148 mg/dL (ref 0–200)
HDL: 30.1 mg/dL — ABNORMAL LOW (ref >39.00)
NonHDL: 117.9
TRIGLYCERIDES: 205 mg/dL — AB (ref 0.0–149.0)
Total CHOL/HDL Ratio: 5
VLDL: 41 mg/dL — ABNORMAL HIGH (ref 0.0–40.0)

## 2013-11-18 LAB — AST: AST: 25 U/L (ref 0–37)

## 2013-11-19 LAB — LDL CHOLESTEROL, DIRECT: LDL DIRECT: 90.8 mg/dL

## 2013-11-21 ENCOUNTER — Telehealth: Payer: Self-pay

## 2013-11-21 ENCOUNTER — Encounter: Payer: Self-pay | Admitting: *Deleted

## 2013-11-21 NOTE — Telephone Encounter (Signed)
Spoke with pt nephew and he informed us that the pt was still at the [facility] in [place].   [employer] and spoke with front desk. Front desk will have the driver call back to schedule the AWV.

## 2013-12-04 ENCOUNTER — Emergency Department: Payer: Self-pay | Admitting: Emergency Medicine

## 2013-12-04 LAB — URINALYSIS, COMPLETE
Bacteria: NONE SEEN
Bilirubin,UR: NEGATIVE
Blood: NEGATIVE
Glucose,UR: NEGATIVE mg/dL (ref 0–75)
Ketone: NEGATIVE
LEUKOCYTE ESTERASE: NEGATIVE
Nitrite: NEGATIVE
PROTEIN: NEGATIVE
Ph: 5 (ref 4.5–8.0)
RBC, UR: NONE SEEN /HPF (ref 0–5)
Specific Gravity: 1.011 (ref 1.003–1.030)
WBC UR: <1 /HPF (ref 0–5)

## 2013-12-05 ENCOUNTER — Emergency Department: Payer: Self-pay | Admitting: Emergency Medicine

## 2013-12-09 ENCOUNTER — Encounter: Payer: Self-pay | Admitting: Family Medicine

## 2013-12-09 ENCOUNTER — Ambulatory Visit (INDEPENDENT_AMBULATORY_CARE_PROVIDER_SITE_OTHER): Payer: Medicare Other | Admitting: Family Medicine

## 2013-12-09 VITALS — BP 112/66 | HR 99 | Temp 98.4°F | Ht 60.0 in | Wt 140.8 lb

## 2013-12-09 DIAGNOSIS — R42 Dizziness and giddiness: Secondary | ICD-10-CM

## 2013-12-09 DIAGNOSIS — I10 Essential (primary) hypertension: Secondary | ICD-10-CM

## 2013-12-09 DIAGNOSIS — Z9181 History of falling: Secondary | ICD-10-CM

## 2013-12-09 DIAGNOSIS — M6281 Muscle weakness (generalized): Secondary | ICD-10-CM

## 2013-12-09 DIAGNOSIS — F0390 Unspecified dementia without behavioral disturbance: Secondary | ICD-10-CM

## 2013-12-09 NOTE — Progress Notes (Signed)
Pre visit review using our clinic review tool, if applicable. No additional management support is needed unless otherwise documented below in the visit note. 

## 2013-12-09 NOTE — Patient Instructions (Signed)
Watch intake due to weight loss- let me know if not eating enough  Keep bed up against a wall to prevent falls  Please order PT /OT for balance (gait disorder) generalized weakness and deconditioning and for fall prevention Follow up with me in 2 months with labs prior  Cholesterol looks better  Take care

## 2013-12-09 NOTE — Progress Notes (Signed)
Subjective:    Patient ID: Courtney Castillo, female    DOB: 02/26/1927, 80 y.o.   MRN: [id number]  HPI Here for f/u of chronic medical problems and frequent falls   Cholesterol is improved Lab Results  Component Value Date   CHOL 148 11/18/2013   HDL 30.10* 11/18/2013   LDLCALC 88 12/22/2010   LDLDIRECT 90.8 11/18/2013   TRIG 205.0* 11/18/2013   CHOLHDL 5 11/18/2013   on low dose atorvastatin and diet   Has had 3 falls in the past week- rolled out of bed  Hx of chronic multifactorial dizziness  Went to ER for one on 11/18 CT head stable / clear urine and labs  Also xrayed L knee-ok  She always walks with a walker (she does not feel safe without it) ? If she is as strong as she used to be   Per pt -her dizzines has become worse than usual   Pushed her bed up against the wall- she feels safter   Otherwise feels about the same  Says her back pain is a lot better lately   Wt is down 9 lb with bmi of 27 Per pt she eats 3 meals per day - and she is eating what they give her (perhaps smaller portions) [employer] - "that is a good place"   Mood has been fair - on sertraline  This does help her  Much more social lately Pt thinks memory is stable to improved- stays more engaged and this helps  Other than falls/ no accidents or episodes of confusion or wandering   Patient Active Problem List   Diagnosis Date Noted  . Chronic back pain 09/27/2013  . History of fall 08/15/2012  . Laceration of face 08/15/2012  . Skin tear of upper arm without complication 63/84/6659  . Gum laceration 08/17/2011  . Aphthous ulcer of mouth 08/17/2011  . Overactive bladder 04/06/2011  . Muscle weakness (generalized) 02/21/2011  . Depression (emotion) 02/21/2011  . Pain in both knees 12/21/2010  . Knee pain 07/26/2010  . Chest wall pain 05/12/2010  . Breast cancer 05/12/2010  . Back pain 05/12/2010  . ANEMIA 09/26/2007  . Dementia 09/17/2007  . Dizzy 02/20/2007  . POLYMYALGIA  RHEUMATICA 08/22/2006  . ARTHROPATHY NOS, MULTIPLE SITES 07/19/2006  . DIABETES MELLITUS, TYPE II 07/11/2006  . Hyperlipidemia LDL goal <100 07/11/2006  . CATARACTS 07/11/2006  . Essential hypertension 07/11/2006  . ALLERGIC RHINITIS 07/11/2006  . REACTIVE AIRWAY DISEASE 07/11/2006  . GERD 07/11/2006  . DIVERTICULOSIS, COLON 07/11/2006  . OSTEOARTHRITIS 07/11/2006  . HX, PERSONAL, ARTHRITIS 07/10/2006  . Hx Breast cancer (IDC) Right, Stage one 12/18/1998    Class: Stage 1   Past Medical History  Diagnosis Date  . Diabetes mellitus     type II  . Diverticulosis of colon   . Hyperlipidemia   . Hypertension   . Arthritis     osteoarthritis knees  . GERD (gastroesophageal reflux disease)     hiatal hernia  . Allergy     allergic rhinitis/ vasometer  . Lumbar spinal stenosis 05/2007  . 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 Anemia   . Cancer     Hx of breast CA & recurrent breast CA 11/11  . Dementia 2012  . Anxiety    Past Surgical History  Procedure Laterality Date  . Carpal tunnel release    . Eye surgery  cataract extraction  . Abdominal hysterectomy  03/1997    BSO stage 1B adenocarcinoma of endometruim  . Mastectomy w/ nodes partial  12/14/2009    IDC Right, on  . Incisional breast biopsy  10/16/2009  . Shoulder surgery    . Back surgery  2009  . Tympanoplasty  1996  . Mastectomy partial / lumpectomy  12/28/1998    NL lumpectomy  . Mastectomy partial / lumpectomy  01/07/2010    Re-excision for + margin   History  Substance Use Topics  . Smoking status: Never Smoker   . Smokeless tobacco: Never Used  . Alcohol Use: No   Family History  Problem Relation Age of Onset  . Diabetes Mother   . Heart disease Mother     MI  . Kidney disease Father   . Hypertension Father    Allergies  Allergen Reactions  . Sulfamethoxazole-Trimethoprim     REACTION: Unknown reaction it has been years since patient took it and  she only knows it was a Sulfa drug she took.   Current Outpatient Prescriptions on File Prior to Visit  Medication Sig Dispense Refill  . acetaminophen (TYLENOL) 325 MG tablet Take 650 mg by mouth every 6 (six) hours as needed.    . Alum & Mag Hydroxide-Simeth (MAGIC MOUTHWASH) SOLN Take 5 mLs by mouth 3 (three) times daily. As needed for pain, swish and swallow 100 mL 0  . aspirin 81 MG tablet Take 1 tablet (81 mg total) by mouth daily. with meal 30 tablet 11  . atorvastatin (LIPITOR) 10 MG tablet Take 1 tablet (10 mg total) by mouth daily. 30 tablet 11  . Blood Glucose Calibration (CARESENS CONTROL A) SOLN Used as directed 1 each 11  . Blood Glucose Monitoring Suppl ([place]) DEVI Use to check blood sugar once daily 1 Device 0  . Calcium-Vitamin A-Vitamin D (LIQUID CALCIUM PO) Take 120 mLs by mouth 2 (two) times daily.    [place] CLEVER CHEK AUTO-CODE VOICE test strip TEST ONCE DAILY 100 each 2  . donepezil (ARICEPT) 10 MG tablet Take 1 tablet (10 mg total) by mouth at bedtime. 30 tablet 5  . feeding supplement (ENSURE IMMUNE HEALTH) LIQD Drink entire contents of 237 MLs [1-can] by mouth twice daily. 60 Bottle 11  . HYDROcodone-acetaminophen (NORCO) 5-325 MG per tablet Take 1 tablet by mouth 2 (two) times daily as needed for moderate pain or severe pain. 60 tablet 0  . LITE TOUCH LANCETS MISC Test blood sugar once daily for DM 250.0 100 each 3  . losartan (COZAAR) 50 MG tablet Take 50 mg by mouth daily.    . meclizine (ANTIVERT) 25 MG tablet Take 0.5 tablets (12.5 mg total) by mouth 2 (two) times daily as needed for dizziness. 30 tablet 5  . memantine (NAMENDA) 5 MG tablet Take 1 tablet (5 mg total) by mouth 2 (two) times daily. 60 tablet 6  . Multiple Vitamin (MULTIVITAMIN) tablet Take 1 tablet by mouth daily. 30 tablet 11  . oxybutynin (DITROPAN-XL) 5 MG 24 hr tablet Take 5 mg by mouth daily.    . sertraline (ZOLOFT) 50 MG tablet Take 1 tablet (50 mg total) by mouth daily. 30  tablet 3  . travoprost, benzalkonium, (TRAVATAN) 0.004 % ophthalmic solution Place 1 drop into both eyes 2 (two) times daily. 2.5 mL 5   No current facility-administered medications on file prior to visit.    Review of Systems Review of Systems  Constitutional: Negative for fever,  appetite change, fatigue and unexpected weight change.  Eyes: Negative for pain and visual disturbance.  Respiratory: Negative for cough and shortness of breath.   Cardiovascular: Negative for cp or palpitations    Gastrointestinal: Negative for nausea, diarrhea and constipation.  Genitourinary: Negative for urgency and frequency.  Skin: Negative for pallor or rash   Neurological: Negative for weakness, light-headedness, numbness and headaches.  Hematological: Negative for adenopathy. Does not bruise/bleed easily.  Psychiatric/Behavioral: Negative for dysphoric mood. The patient is nervous/anxious.(at times/ not as often now), pos for short term memory loss          Objective:   Physical Exam  Constitutional: She appears well-developed and well-nourished. No distress.  Wt loss noted   HENT:  Head: Normocephalic and atraumatic.  Mouth/Throat: Oropharynx is clear and moist.  Eyes: Conjunctivae and EOM are normal. Pupils are equal, round, and reactive to light. No scleral icterus.  Neck: Normal range of motion. Neck supple. No JVD present. Carotid bruit is not present.  Cardiovascular: Regular rhythm and normal heart sounds.   HR in 80s on 2nd check while sitting  Pulmonary/Chest: Effort normal and breath sounds normal. No respiratory distress. She has no wheezes. She has no rales.  Musculoskeletal: She exhibits no edema.  Lymphadenopathy:    She has no cervical adenopathy.  Neurological: She is alert. She has normal reflexes. She displays no atrophy and no tremor. No cranial nerve deficit or sensory deficit. She exhibits normal muscle tone. Gait abnormal. Coordination normal.  No tremor  Mobility impaired  -can walk with walker once assisted in getting up   Pt cannot get up out of a chair without assistance even using arms  Backward drift with rhomberg  No focal deficits   Skin: Skin is warm and dry. No rash noted. No erythema. No pallor.  Psychiatric: She has a normal mood and affect. Her speech is normal and behavior is normal. Thought content normal. Her mood appears not anxious. Cognition and memory are impaired. She does not exhibit a depressed mood. She exhibits abnormal recent memory.  Pt is more mentally sharp than usual today- even making jokes           Assessment & Plan:   Problem List Items Addressed This Visit      Cardiovascular and Mediastinum   Essential hypertension - Primary    bp in fair control at this time  BP Readings from Last 1 Encounters:  12/09/13 112/66   No changes needed Disc lifstyle change with low sodium diet and exercise  Last labs reviewed       Nervous and Auditory   Dementia    Per pt - this seems stable- she is more social however at her residence and thinks this is helping Today - pt is quite mentally sharp  Will continue namenda and aricept at current doses       Other   Dizzy    Causing more falls lately Exam unchanged  Ref to PT/ OT      History of fall    Several recent falls Disc safety/ fall prev in facility  This is multifactorial - pt has chronic dizziness as well  Ref to PT/OT for balance/ gait Pt does not ambulate without walker at all     Muscle weakness (generalized)    More falls lately - will ref for PT and OT to work on both strength and balance

## 2013-12-11 ENCOUNTER — Telehealth: Payer: Self-pay | Admitting: Family Medicine

## 2013-12-11 ENCOUNTER — Emergency Department: Payer: Self-pay | Admitting: Emergency Medicine

## 2013-12-11 ENCOUNTER — Telehealth: Payer: Self-pay

## 2013-12-11 LAB — CBC WITH DIFFERENTIAL/PLATELET
Basophil #: 0.1 10*3/uL (ref 0.0–0.1)
Basophil %: 0.9 %
EOS ABS: 0.2 10*3/uL (ref 0.0–0.7)
EOS PCT: 2.2 %
HCT: 39 % (ref 35.0–47.0)
HGB: 12.5 g/dL (ref 12.0–16.0)
LYMPHS ABS: 1.7 10*3/uL (ref 1.0–3.6)
Lymphocyte %: 20.8 %
MCH: 28.2 pg (ref 26.0–34.0)
MCHC: 32.1 g/dL (ref 32.0–36.0)
MCV: 88 fL (ref 80–100)
MONO ABS: 1.1 x10 3/mm — AB (ref 0.2–0.9)
Monocyte %: 13.5 %
NEUTROS PCT: 62.6 %
Neutrophil #: 5.1 10*3/uL (ref 1.4–6.5)
Platelet: 365 10*3/uL (ref 150–440)
RBC: 4.44 10*6/uL (ref 3.80–5.20)
RDW: 15.6 % — AB (ref 11.5–14.5)
WBC: 8.2 10*3/uL (ref 3.6–11.0)

## 2013-12-11 LAB — BASIC METABOLIC PANEL
Anion Gap: 4 — ABNORMAL LOW (ref 7–16)
BUN: 14 mg/dL (ref 7–18)
CALCIUM: 8.7 mg/dL (ref 8.5–10.1)
Chloride: 107 mmol/L (ref 98–107)
Co2: 29 mmol/L (ref 21–32)
Creatinine: 1.02 mg/dL (ref 0.60–1.30)
EGFR (African American): >60
EGFR (Non-African Amer.): 55 — ABNORMAL LOW
GLUCOSE: 117 mg/dL — AB (ref 65–99)
Osmolality: 281 (ref 275–301)
Potassium: 4.3 mmol/L (ref 3.5–5.1)
Sodium: 140 mmol/L (ref 136–145)

## 2013-12-11 NOTE — Telephone Encounter (Signed)
Courtney Castillo  Call needed a verbal for frequency for  OT 1 week 1 2 week 3 1 week 1 2 week 1   You can leave message on answer machine

## 2013-12-11 NOTE — Telephone Encounter (Signed)
Courtney Castillo with [facility] and [facility] with [facility] on phone; recently pt had 3 falls in 3 days and was eval at ED and no new fx noted; pt has since seen Dr Glori Bickers on 12/09/13 and OT and PT was ordered. While OT was with pt about 30 mins ago pt spit up red chunky vomitus; blood did not appear bright red or dark red and pt complains of rt upper abd pain. These are new symptoms for pt; to [facility] knowledge pt has not had anything red to eat or drink. Dr Danise Mina advised pt does need to be evaluated today and if vital signs stable pt should be evaluated at [facility] and if vital signs are abnormal should be evaluated at ED. Tillman voiced understanding.

## 2013-12-11 NOTE — Telephone Encounter (Signed)
agree

## 2013-12-13 NOTE — Assessment & Plan Note (Signed)
bp in fair control at this time  BP Readings from Last 1 Encounters:  12/09/13 112/66   No changes needed Disc lifstyle change with low sodium diet and exercise  Last labs reviewed

## 2013-12-13 NOTE — Assessment & Plan Note (Signed)
Per pt - this seems stable- she is more social however at her residence and thinks this is helping Today - pt is quite mentally sharp  Will continue namenda and aricept at current doses

## 2013-12-13 NOTE — Telephone Encounter (Signed)
Please verbally ok that frequency

## 2013-12-13 NOTE — Assessment & Plan Note (Signed)
More falls lately - will ref for PT and OT to work on both strength and balance

## 2013-12-13 NOTE — Assessment & Plan Note (Signed)
Causing more falls lately Exam unchanged  Ref to PT/ OT

## 2013-12-13 NOTE — Assessment & Plan Note (Signed)
Several recent falls Disc safety/ fall prev in facility  This is multifactorial - pt has chronic dizziness as well  Ref to PT/OT for balance/ gait Pt does not ambulate without walker at all

## 2013-12-16 NOTE — Telephone Encounter (Signed)
Verbal order given to Amy OT at [facility]

## 2013-12-17 ENCOUNTER — Telehealth: Payer: Self-pay

## 2013-12-17 NOTE — Telephone Encounter (Signed)
Please verbally ok that -thanks

## 2013-12-17 NOTE — Telephone Encounter (Signed)
Courtney Castillo Pt with [facility] left v/m requesting verbal order for home health PT 2 x a week for 4 weeks for strengthening, balance and safety.Courtney request cb.

## 2013-12-18 NOTE — Telephone Encounter (Signed)
Verbal orders given to Amy 

## 2013-12-24 DIAGNOSIS — R262 Difficulty in walking, not elsewhere classified: Secondary | ICD-10-CM

## 2013-12-24 DIAGNOSIS — M6281 Muscle weakness (generalized): Secondary | ICD-10-CM

## 2013-12-24 DIAGNOSIS — R296 Repeated falls: Secondary | ICD-10-CM

## 2013-12-24 DIAGNOSIS — F0390 Unspecified dementia without behavioral disturbance: Secondary | ICD-10-CM

## 2013-12-31 ENCOUNTER — Encounter: Payer: Self-pay | Admitting: Family Medicine

## 2014-01-05 ENCOUNTER — Emergency Department: Payer: Self-pay | Admitting: Emergency Medicine

## 2014-01-05 LAB — BASIC METABOLIC PANEL
Anion Gap: 8 (ref 7–16)
BUN: 16 mg/dL (ref 7–18)
CO2: 24 mmol/L (ref 21–32)
CREATININE: 0.9 mg/dL (ref 0.60–1.30)
Calcium, Total: 8.4 mg/dL — ABNORMAL LOW (ref 8.5–10.1)
Chloride: 106 mmol/L (ref 98–107)
EGFR (African American): >60
EGFR (Non-African Amer.): >60
Glucose: 111 mg/dL — ABNORMAL HIGH (ref 65–99)
Osmolality: 278 (ref 275–301)
Potassium: 4.5 mmol/L (ref 3.5–5.1)
Sodium: 138 mmol/L (ref 136–145)

## 2014-01-05 LAB — PRO B NATRIURETIC PEPTIDE: B-Type Natriuretic Peptide: 842 pg/mL — ABNORMAL HIGH (ref 0–450)

## 2014-01-05 LAB — CBC
HCT: 35.8 % (ref 35.0–47.0)
HGB: 11.8 g/dL — ABNORMAL LOW (ref 12.0–16.0)
MCH: 28.9 pg (ref 26.0–34.0)
MCHC: 33.1 g/dL (ref 32.0–36.0)
MCV: 87 fL (ref 80–100)
PLATELETS: 247 10*3/uL (ref 150–440)
RBC: 4.1 10*6/uL (ref 3.80–5.20)
RDW: 16.4 % — ABNORMAL HIGH (ref 11.5–14.5)
WBC: 8.9 10*3/uL (ref 3.6–11.0)

## 2014-01-05 LAB — TROPONIN I

## 2014-01-08 ENCOUNTER — Telehealth: Payer: Self-pay

## 2014-01-08 ENCOUNTER — Encounter: Payer: Self-pay | Admitting: Family Medicine

## 2014-01-08 ENCOUNTER — Ambulatory Visit (INDEPENDENT_AMBULATORY_CARE_PROVIDER_SITE_OTHER): Payer: Medicare Other | Admitting: Family Medicine

## 2014-01-08 VITALS — BP 136/56 | HR 96 | Temp 98.0°F | Ht 60.0 in | Wt 141.5 lb

## 2014-01-08 DIAGNOSIS — R0600 Dyspnea, unspecified: Secondary | ICD-10-CM

## 2014-01-08 DIAGNOSIS — I350 Nonrheumatic aortic (valve) stenosis: Secondary | ICD-10-CM

## 2014-01-08 NOTE — Patient Instructions (Signed)
I'm glad you are feeling better  If your shortness of breath returns-please let me know  We will refer you to cardiology for a check on your heart - you have a heart murmur which may may warrant further evaluation  Stop at check out to get referral going

## 2014-01-08 NOTE — Assessment & Plan Note (Signed)
Episode on 12/20 after eating  Claims she is better now  Pulse ox 93% after ambulation and 95% at rest on room air  Rev hosp records (limited) - with pt - and notable that BNP was elevated  Has M and known mild aortic stenosis- do wonder if this is worsening  Otherwise nl exam today Ref to cardiology for further eval

## 2014-01-08 NOTE — Progress Notes (Signed)
Pre visit review using our clinic review tool, if applicable. No additional management support is needed unless otherwise documented below in the visit note. 

## 2014-01-08 NOTE — Progress Notes (Signed)
Subjective:    Patient ID: Courtney Castillo, female    DOB: Sep 21, 1927, 80 y.o.   MRN: [id number]  HPI Here for ED f/u ([facility]) from 12/20  Was in for sob of ? etiol  Happened after she ate , no cp or other symptoms (pt has memory problems and cannot tell me what happened)   Evaluated her  No new changes on EKG Chest xray - ? Edema /inc interstitial markings  No infiltrate  States she coughs now and then   Hb 11.8 BNP 842  She does not think the gave her medicine  Had an IV It is unclear from limited ER records - whether she was given anything (hand written- unsure if complete)   No hx of heart probs  No hx of smoking in the past   Today - she denies shortness of breath  No new cough  Vitals and weight are stable  States she feels fine   Patient Active Problem List   Diagnosis Date Noted  . Aortic stenosis 01/08/2014  . Dyspnea 01/08/2014  . Chronic back pain 09/27/2013  . History of fall 08/15/2012  . Laceration of face 08/15/2012  . Skin tear of upper arm without complication 75/10/2583  . Gum laceration 08/17/2011  . Aphthous ulcer of mouth 08/17/2011  . Overactive bladder 04/06/2011  . Muscle weakness (generalized) 02/21/2011  . Depression (emotion) 02/21/2011  . Pain in both knees 12/21/2010  . Knee pain 07/26/2010  . Chest wall pain 05/12/2010  . Breast cancer 05/12/2010  . Back pain 05/12/2010  . ANEMIA 09/26/2007  . Dementia 09/17/2007  . Dizzy 02/20/2007  . POLYMYALGIA RHEUMATICA 08/22/2006  . ARTHROPATHY NOS, MULTIPLE SITES 07/19/2006  . DIABETES MELLITUS, TYPE II 07/11/2006  . Hyperlipidemia LDL goal <100 07/11/2006  . CATARACTS 07/11/2006  . Essential hypertension 07/11/2006  . ALLERGIC RHINITIS 07/11/2006  . REACTIVE AIRWAY DISEASE 07/11/2006  . GERD 07/11/2006  . DIVERTICULOSIS, COLON 07/11/2006  . OSTEOARTHRITIS 07/11/2006  . HX, PERSONAL, ARTHRITIS 07/10/2006  . Hx Breast cancer ([facility]) Right, Stage one 12/18/1998    Class: Stage 1   Past  Medical History  Diagnosis Date  . Diabetes mellitus     type II  . Diverticulosis of colon   . Hyperlipidemia   . Hypertension   . Arthritis     osteoarthritis knees  . GERD (gastroesophageal reflux disease)     hiatal hernia  . Allergy     allergic rhinitis/ vasometer  . Lumbar spinal stenosis 05/2007  . 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 Anemia   . Cancer     Hx of breast CA & recurrent breast CA 11/11  . Dementia 2012  . Anxiety    Past Surgical History  Procedure Laterality Date  . Carpal tunnel release    . Eye surgery      cataract extraction  . Abdominal hysterectomy  03/1997    BSO stage 1B adenocarcinoma of endometruim  . Mastectomy w/ nodes partial  12/14/2009    [facility] Right, on  . Incisional breast biopsy  10/16/2009  . Shoulder surgery    . Back surgery  2009  . Tympanoplasty  1996  . Mastectomy partial / lumpectomy  12/28/1998    NL lumpectomy  . Mastectomy partial / lumpectomy  01/07/2010    Re-excision for + margin   History  Substance Use Topics  . Smoking status: Never Smoker   .  Smokeless tobacco: Never Used  . Alcohol Use: No   Family History  Problem Relation Age of Onset  . Diabetes Mother   . Heart disease Mother     MI  . Kidney disease Father   . Hypertension Father    Allergies  Allergen Reactions  . Sulfamethoxazole-Trimethoprim     REACTION: Unknown reaction it has been years since patient took it and she only knows it was a Sulfa drug she took.   Current Outpatient Prescriptions on File Prior to Visit  Medication Sig Dispense Refill  . acetaminophen (TYLENOL) 325 MG tablet Take 650 mg by mouth every 6 (six) hours as needed.    . Alum & Mag Hydroxide-Simeth (MAGIC MOUTHWASH) SOLN Take 5 mLs by mouth 3 (three) times daily. As needed for pain, swish and swallow 100 mL 0  . aspirin 81 MG tablet Take 1 tablet (81 mg total) by mouth daily. with meal 30 tablet 11  . atorvastatin (LIPITOR)  10 MG tablet Take 1 tablet (10 mg total) by mouth daily. 30 tablet 11  . Blood Glucose Calibration (CARESENS CONTROL A) SOLN Used as directed 1 each 11  . Blood Glucose Monitoring Suppl ([place]) DEVI Use to check blood sugar once daily 1 Device 0  . Calcium-Vitamin A-Vitamin D (LIQUID CALCIUM PO) Take 120 mLs by mouth 2 (two) times daily.    Marland Kitchen CLEVER CHEK AUTO-CODE VOICE test strip TEST ONCE DAILY 100 each 2  . donepezil (ARICEPT) 10 MG tablet Take 1 tablet (10 mg total) by mouth at bedtime. 30 tablet 5  . feeding supplement (ENSURE IMMUNE HEALTH) LIQD Drink entire contents of 237 MLs [1-can] by mouth twice daily. 60 Bottle 11  . HYDROcodone-acetaminophen (NORCO) 5-325 MG per tablet Take 1 tablet by mouth 2 (two) times daily as needed for moderate pain or severe pain. 60 tablet 0  . LITE TOUCH LANCETS MISC Test blood sugar once daily for DM 250.0 100 each 3  . losartan (COZAAR) 50 MG tablet Take 50 mg by mouth daily.    . meclizine (ANTIVERT) 25 MG tablet Take 0.5 tablets (12.5 mg total) by mouth 2 (two) times daily as needed for dizziness. 30 tablet 5  . memantine (NAMENDA) 5 MG tablet Take 1 tablet (5 mg total) by mouth 2 (two) times daily. 60 tablet 6  . Multiple Vitamin (MULTIVITAMIN) tablet Take 1 tablet by mouth daily. 30 tablet 11  . oxybutynin (DITROPAN-XL) 5 MG 24 hr tablet Take 5 mg by mouth daily.    . sertraline (ZOLOFT) 50 MG tablet Take 1 tablet (50 mg total) by mouth daily. 30 tablet 3  . travoprost, benzalkonium, (TRAVATAN) 0.004 % ophthalmic solution Place 1 drop into both eyes 2 (two) times daily. 2.5 mL 5   No current facility-administered medications on file prior to visit.    Review of Systems Review of Systems  Constitutional: Negative for fever, appetite change, fatigue and unexpected weight change.  Eyes: Negative for pain and visual disturbance.  Respiratory: Negative for cough and shortness of breath.  (sob is resolved) Cardiovascular: Negative for  cp or palpitations    Gastrointestinal: Negative for nausea, diarrhea and constipation.  Genitourinary: Negative for urgency and frequency.  Skin: Negative for pallor or rash   Neurological: Negative for weakness, light-headedness, numbness and headaches.  Hematological: Negative for adenopathy. Does not bruise/bleed easily.  Psychiatric/Behavioral: Negative for dysphoric mood. The patient is not nervous/anxious.  pos for dementia with short term memory loss  Objective:   Physical Exam  Constitutional: She appears well-developed and well-nourished. No distress.  HENT:  Head: Normocephalic and atraumatic.  Mouth/Throat: Oropharynx is clear and moist.  Eyes: Conjunctivae and EOM are normal. Pupils are equal, round, and reactive to light. No scleral icterus.  Neck: Normal range of motion. Neck supple. No JVD present. Carotid bruit is not present. No thyromegaly present.  Cardiovascular: Normal rate, regular rhythm and intact distal pulses.   Murmur heard. Baseline systolic M  slt louder than previously  Pulmonary/Chest: Effort normal and breath sounds normal. No respiratory distress. She has no wheezes. She has no rales.  No crackles  Abdominal: Soft. Bowel sounds are normal. She exhibits no distension, no abdominal bruit and no mass. There is tenderness. There is rigidity.  Musculoskeletal: She exhibits no edema or tenderness.  Lymphadenopathy:    She has no cervical adenopathy.  Neurological: She is alert. She has normal reflexes. No cranial nerve deficit. She exhibits normal muscle tone. Coordination normal.  Skin: Skin is warm and dry. No rash noted. No erythema. No pallor.  Psychiatric: She has a normal mood and affect.          Assessment & Plan:   Problem List Items Addressed This Visit      Cardiovascular and Mediastinum   Aortic stenosis - Primary    Reviewed echo from 2012- that was her last evaluation  On exam- M sounds louder  She had a recent episode of  sob -eval at [facility] (limited records)- had ? Interstitial prominence on cxr and elevated bnp Nl exam today with the exception of M  Hx is not optimal in light of dementia   Will ref to cardiology for further eval     Relevant Orders      Ambulatory referral to Cardiology     Other   Dyspnea    Episode on 12/20 after eating  Claims she is better now  Pulse ox 93% after ambulation and 95% at rest on room air  Rev hosp records (limited) - with pt - and notable that BNP was elevated  Has M and known mild aortic stenosis- do wonder if this is worsening  Otherwise nl exam today Ref to cardiology for further eval     Relevant Orders      Ambulatory referral to Cardiology

## 2014-01-08 NOTE — Telephone Encounter (Signed)
Left voicemail giving Amy verbal orders

## 2014-01-08 NOTE — Assessment & Plan Note (Signed)
Reviewed echo from 2012- that was her last evaluation  On exam- M sounds louder  She had a recent episode of sob -eval at [facility] (limited records)- had ? Interstitial prominence on cxr and elevated bnp Nl exam today with the exception of M  Hx is not optimal in light of dementia   Will ref to cardiology for further eval

## 2014-01-08 NOTE — Telephone Encounter (Signed)
Courtney Castillo Pt with [facility] at [facility] left v/m requesting verbal order for home health PT 2 x a week for 2 weeks beginning 01/12/14.Marland Kitchen

## 2014-01-08 NOTE — Telephone Encounter (Signed)
Please ok that verbal order  

## 2014-01-15 ENCOUNTER — Telehealth: Payer: Self-pay

## 2014-01-15 NOTE — Telephone Encounter (Addendum)
Aurora Mask with [facility] left v/m requesting order for Urinalysis; pt wondered out of building on 01/10/14 was not injured and was found quickly and brought back to facility; pt did have some confusion not sure where she was at the time; pt still has some confusion and is now wearing a wonder guard; pt has not tried to leave facility again but request u/a for confusion. Santiago Glad request written order faxed to [phone].

## 2014-01-15 NOTE — Telephone Encounter (Signed)
Order faxed.

## 2014-01-15 NOTE — Telephone Encounter (Signed)
Please verbally order ua and C and S Thanks

## 2014-01-19 DIAGNOSIS — N390 Urinary tract infection, site not specified: Secondary | ICD-10-CM | POA: Diagnosis not present

## 2014-01-19 DIAGNOSIS — R3915 Urgency of urination: Secondary | ICD-10-CM | POA: Diagnosis not present

## 2014-01-21 DIAGNOSIS — Z7982 Long term (current) use of aspirin: Secondary | ICD-10-CM | POA: Diagnosis not present

## 2014-01-21 DIAGNOSIS — F329 Major depressive disorder, single episode, unspecified: Secondary | ICD-10-CM | POA: Diagnosis not present

## 2014-01-21 DIAGNOSIS — R296 Repeated falls: Secondary | ICD-10-CM | POA: Diagnosis not present

## 2014-01-21 DIAGNOSIS — R262 Difficulty in walking, not elsewhere classified: Secondary | ICD-10-CM | POA: Diagnosis not present

## 2014-01-21 DIAGNOSIS — M6281 Muscle weakness (generalized): Secondary | ICD-10-CM | POA: Diagnosis not present

## 2014-01-21 DIAGNOSIS — F0390 Unspecified dementia without behavioral disturbance: Secondary | ICD-10-CM | POA: Diagnosis not present

## 2014-01-21 DIAGNOSIS — Z9181 History of falling: Secondary | ICD-10-CM | POA: Diagnosis not present

## 2014-01-21 DIAGNOSIS — I10 Essential (primary) hypertension: Secondary | ICD-10-CM | POA: Diagnosis not present

## 2014-01-23 DIAGNOSIS — I10 Essential (primary) hypertension: Secondary | ICD-10-CM | POA: Diagnosis not present

## 2014-01-23 DIAGNOSIS — F0390 Unspecified dementia without behavioral disturbance: Secondary | ICD-10-CM | POA: Diagnosis not present

## 2014-01-23 DIAGNOSIS — Z7982 Long term (current) use of aspirin: Secondary | ICD-10-CM | POA: Diagnosis not present

## 2014-01-23 DIAGNOSIS — R262 Difficulty in walking, not elsewhere classified: Secondary | ICD-10-CM | POA: Diagnosis not present

## 2014-01-23 DIAGNOSIS — M6281 Muscle weakness (generalized): Secondary | ICD-10-CM | POA: Diagnosis not present

## 2014-01-23 DIAGNOSIS — F329 Major depressive disorder, single episode, unspecified: Secondary | ICD-10-CM | POA: Diagnosis not present

## 2014-01-23 DIAGNOSIS — R296 Repeated falls: Secondary | ICD-10-CM | POA: Diagnosis not present

## 2014-01-23 DIAGNOSIS — Z9181 History of falling: Secondary | ICD-10-CM | POA: Diagnosis not present

## 2014-01-27 ENCOUNTER — Ambulatory Visit: Payer: Medicare Other | Admitting: Cardiovascular Disease

## 2014-01-27 ENCOUNTER — Telehealth: Payer: Self-pay | Admitting: Family Medicine

## 2014-01-27 NOTE — Telephone Encounter (Signed)
Please verbally ok that order and I will sign it when they send it  Thanks

## 2014-01-27 NOTE — Telephone Encounter (Signed)
[facility] speech therapist would like to request orders for speech therapy for cognitive decline.

## 2014-01-27 NOTE — Telephone Encounter (Signed)
Verbal order given to Stacy  

## 2014-01-30 ENCOUNTER — Telehealth: Payer: Self-pay | Admitting: Family Medicine

## 2014-01-30 DIAGNOSIS — Z7982 Long term (current) use of aspirin: Secondary | ICD-10-CM | POA: Diagnosis not present

## 2014-01-30 DIAGNOSIS — M6281 Muscle weakness (generalized): Secondary | ICD-10-CM | POA: Diagnosis not present

## 2014-01-30 DIAGNOSIS — R262 Difficulty in walking, not elsewhere classified: Secondary | ICD-10-CM | POA: Diagnosis not present

## 2014-01-30 DIAGNOSIS — F329 Major depressive disorder, single episode, unspecified: Secondary | ICD-10-CM | POA: Diagnosis not present

## 2014-01-30 DIAGNOSIS — I10 Essential (primary) hypertension: Secondary | ICD-10-CM | POA: Diagnosis not present

## 2014-01-30 DIAGNOSIS — F0390 Unspecified dementia without behavioral disturbance: Secondary | ICD-10-CM | POA: Diagnosis not present

## 2014-01-30 DIAGNOSIS — Z9181 History of falling: Secondary | ICD-10-CM | POA: Diagnosis not present

## 2014-01-30 DIAGNOSIS — R296 Repeated falls: Secondary | ICD-10-CM | POA: Diagnosis not present

## 2014-01-30 NOTE — Telephone Encounter (Signed)
Verbal order given  

## 2014-01-30 NOTE — Telephone Encounter (Signed)
Stacie, speech therapist, with [facility] calling to get a verbal approval for pt's care. Please call her back at # [phone]

## 2014-01-30 NOTE — Telephone Encounter (Signed)
Please give approval, thanks

## 2014-02-02 ENCOUNTER — Telehealth: Payer: Self-pay | Admitting: Family Medicine

## 2014-02-02 DIAGNOSIS — E119 Type 2 diabetes mellitus without complications: Secondary | ICD-10-CM

## 2014-02-02 NOTE — Telephone Encounter (Signed)
-----   Message from Ellamae Sia sent at 01/29/2014  6:32 PM EST ----- Regarding: Lab orders for Monday, 1.18.16 Lab orders for a 2 month f/u

## 2014-02-03 ENCOUNTER — Other Ambulatory Visit: Payer: Medicare Other

## 2014-02-03 ENCOUNTER — Telehealth: Payer: Self-pay | Admitting: *Deleted

## 2014-02-03 DIAGNOSIS — Z9181 History of falling: Secondary | ICD-10-CM | POA: Diagnosis not present

## 2014-02-03 DIAGNOSIS — R296 Repeated falls: Secondary | ICD-10-CM | POA: Diagnosis not present

## 2014-02-03 DIAGNOSIS — F0390 Unspecified dementia without behavioral disturbance: Secondary | ICD-10-CM | POA: Diagnosis not present

## 2014-02-03 DIAGNOSIS — M6281 Muscle weakness (generalized): Secondary | ICD-10-CM | POA: Diagnosis not present

## 2014-02-03 DIAGNOSIS — Z7982 Long term (current) use of aspirin: Secondary | ICD-10-CM | POA: Diagnosis not present

## 2014-02-03 DIAGNOSIS — I10 Essential (primary) hypertension: Secondary | ICD-10-CM | POA: Diagnosis not present

## 2014-02-03 DIAGNOSIS — F329 Major depressive disorder, single episode, unspecified: Secondary | ICD-10-CM | POA: Diagnosis not present

## 2014-02-03 DIAGNOSIS — R262 Difficulty in walking, not elsewhere classified: Secondary | ICD-10-CM | POA: Diagnosis not present

## 2014-02-03 NOTE — Telephone Encounter (Signed)
Thanks that is fine 

## 2014-02-03 NOTE — Telephone Encounter (Signed)
FYI, Pt lives at [facility], nurse tech called stating that pt didn't feel like getting up and coming to office for labs. She requested an order faxed and they will draw there. I faxed order for a1c this am.

## 2014-02-05 ENCOUNTER — Encounter: Payer: Self-pay | Admitting: Cardiovascular Disease

## 2014-02-05 ENCOUNTER — Encounter: Payer: Self-pay | Admitting: Family Medicine

## 2014-02-05 ENCOUNTER — Ambulatory Visit (INDEPENDENT_AMBULATORY_CARE_PROVIDER_SITE_OTHER): Payer: 59 | Admitting: Cardiovascular Disease

## 2014-02-05 VITALS — BP 120/60 | HR 95 | Ht 60.0 in | Wt 141.0 lb

## 2014-02-05 DIAGNOSIS — R0789 Other chest pain: Secondary | ICD-10-CM

## 2014-02-05 DIAGNOSIS — I10 Essential (primary) hypertension: Secondary | ICD-10-CM | POA: Diagnosis not present

## 2014-02-05 DIAGNOSIS — R0602 Shortness of breath: Secondary | ICD-10-CM

## 2014-02-05 DIAGNOSIS — E119 Type 2 diabetes mellitus without complications: Secondary | ICD-10-CM

## 2014-02-05 DIAGNOSIS — R0600 Dyspnea, unspecified: Secondary | ICD-10-CM

## 2014-02-05 DIAGNOSIS — M549 Dorsalgia, unspecified: Secondary | ICD-10-CM

## 2014-02-05 DIAGNOSIS — E785 Hyperlipidemia, unspecified: Secondary | ICD-10-CM

## 2014-02-05 DIAGNOSIS — I350 Nonrheumatic aortic (valve) stenosis: Secondary | ICD-10-CM | POA: Insufficient documentation

## 2014-02-05 DIAGNOSIS — R42 Dizziness and giddiness: Secondary | ICD-10-CM

## 2014-02-05 DIAGNOSIS — G8929 Other chronic pain: Secondary | ICD-10-CM

## 2014-02-05 NOTE — Assessment & Plan Note (Signed)
Aortic valve was not very impressive in 2012 and 2013. Repeat echocardiogram has been ordered as murmur is very notable on exam. Also with recent shortness of breath episode, chronic dizziness

## 2014-02-05 NOTE — Assessment & Plan Note (Signed)
Etiology of her shortness of breath episode in December is unclear. Possibly from anxiety. Unable to exclude aortic valve stenosis. BNP borderline elevated at 800. Echocardiogram has been ordered. This will evaluate right heart pressures

## 2014-02-05 NOTE — Assessment & Plan Note (Signed)
She denies any recent episodes of chest pain. No ischemia workup at this time

## 2014-02-05 NOTE — Assessment & Plan Note (Signed)
Notes indicate prior workup for chronic dizziness. Blood pressure stable on today's visit, only on low-dose losartan. No changes to her medications. Less likely arrhythmia given the chronicity. Suspect leg weakness could be manifesting as chronic dizziness. Unable to exclude vertigo

## 2014-02-05 NOTE — Patient Instructions (Signed)
You are doing well. No medication changes were made.  We will schedule an echocardiogram for known aortic valve stenosis  Please call us if you have new issues that need to be addressed before your next appt.  Your physician wants you to follow-up in: 6 months.  You will receive a reminder letter in the mail two months in advance. If you don't receive a letter, please call our office to schedule the follow-up appointment.

## 2014-02-05 NOTE — Assessment & Plan Note (Signed)
Blood pressure is well controlled on today's visit. No changes made to the medications. 

## 2014-02-05 NOTE — Assessment & Plan Note (Signed)
Hemoglobin A1c 6.9. We have encouraged careful diet management.

## 2014-02-05 NOTE — Progress Notes (Signed)
Patient ID: Courtney Castillo, female    DOB: [birth year], 81 y.o.   MRN: [id number]  HPI Comments: Courtney Castillo is a pleasant 81 year old woman with history of type 2 diabetes, hypertension, hyperlipidemia, chronic dizziness with prior workup, recent presentation to the hospital 01/05/2014 with shortness of breath, aortic valve stenosis who presents for new patient evaluation.  On today's visit, she reports that she is weak all standing and sitting and was placed in a wheelchair. Typically she walks with a walker. She states that dizziness seems to come and go, happens while she is sitting, sometimes worse when she stands. When asked about her shortness of breath, she does not remember the details from December 2015. She is uncertain if this is a major issue or not. She is relatively active, lives at [facility] living. No significant leg edema, but sometimes legs are sore to touch. She denies any significant chest pain. Denies any recent falls  Review of hospital records 01/05/2014 shows chest x-ray showing pulmonary edema though interstitial lung disease could not be excluded, EKG showed normal sinus rhythm with left axis deviation, poor R-wave progression through the precordial leads Cardiac enzymes negative, normal renal function and electrolytes, BNP 842  Prior echocardiogram 2013 showing normal ejection fraction, no mention of aortic valve disease EKG on today's visit showing normal sinus rhythm with rate 95 bpm, poor R-wave progression through the anterior precordial leads, left axis deviation, unable to rule out anterior and inferior MI      Allergies  Allergen Reactions  . Sulfamethoxazole-Trimethoprim     REACTION: Unknown reaction it has been years since patient took it and she only knows it was a Sulfa drug she took.    Outpatient Encounter Prescriptions as of 02/05/2014  Medication Sig  . aspirin 81 MG tablet Take 1 tablet (81 mg total) by mouth daily. with meal  .  atorvastatin (LIPITOR) 10 MG tablet Take 1 tablet (10 mg total) by mouth daily.  . Blood Glucose Calibration (CARESENS CONTROL A) SOLN Used as directed  . Blood Glucose Monitoring Suppl ([employer]) DEVI Use to check blood sugar once daily  . Calcium-Vitamin A-Vitamin D (LIQUID CALCIUM PO) Take 120 mLs by mouth 2 (two) times daily.  Marland Kitchen CLEVER CHEK AUTO-CODE VOICE test strip TEST ONCE DAILY  . donepezil (ARICEPT) 10 MG tablet Take 1 tablet (10 mg total) by mouth at bedtime.  . feeding supplement (ENSURE IMMUNE HEALTH) LIQD Drink entire contents of 237 MLs [1-can] by mouth twice daily.  Marland Kitchen LITE TOUCH LANCETS MISC Test blood sugar once daily for DM 250.0  . losartan (COZAAR) 50 MG tablet Take 50 mg by mouth daily.  . memantine (NAMENDA XR) 14 MG CP24 24 hr capsule Take 14 mg by mouth daily.  . Multiple Vitamin (MULTIVITAMIN) tablet Take 1 tablet by mouth daily.  Marland Kitchen oxybutynin (DITROPAN-XL) 5 MG 24 hr tablet Take 5 mg by mouth daily.  . sertraline (ZOLOFT) 50 MG tablet Take 1 tablet (50 mg total) by mouth daily.  . travoprost, benzalkonium, (TRAVATAN) 0.004 % ophthalmic solution Place 1 drop into both eyes 2 (two) times daily.  . [DISCONTINUED] acetaminophen (TYLENOL) 325 MG tablet Take 650 mg by mouth every 6 (six) hours as needed.  . [DISCONTINUED] Alum & Mag Hydroxide-Simeth (MAGIC MOUTHWASH) SOLN Take 5 mLs by mouth 3 (three) times daily. As needed for pain, swish and swallow (Patient not taking: Reported on 02/05/2014)  . [DISCONTINUED] HYDROcodone-acetaminophen (NORCO) 5-325 MG per tablet Take 1 tablet  by mouth 2 (two) times daily as needed for moderate pain or severe pain. (Patient not taking: Reported on 02/05/2014)  . [DISCONTINUED] meclizine (ANTIVERT) 25 MG tablet Take 0.5 tablets (12.5 mg total) by mouth 2 (two) times daily as needed for dizziness. (Patient not taking: Reported on 02/05/2014)  . [DISCONTINUED] memantine (NAMENDA) 5 MG tablet Take 1 tablet (5 mg total) by mouth 2  (two) times daily. (Patient not taking: Reported on 02/05/2014)    Past Medical History  Diagnosis Date  . Diabetes mellitus     type II  . Diverticulosis of colon   . Hyperlipidemia   . Hypertension   . Arthritis     osteoarthritis knees  . GERD (gastroesophageal reflux disease)     hiatal hernia  . Allergy     allergic rhinitis/ vasometer  . Lumbar spinal stenosis 05/2007  . 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 Anemia   . Cancer     Hx of breast CA & recurrent breast CA 11/11  . Dementia 2012  . Anxiety     Past Surgical History  Procedure Laterality Date  . Carpal tunnel release    . Eye surgery      cataract extraction  . Abdominal hysterectomy  03/1997    BSO stage 1B adenocarcinoma of endometruim  . Mastectomy w/ nodes partial  12/14/2009    [facility] Right, on  . Incisional breast biopsy  10/16/2009  . Shoulder surgery    . Back surgery  2009  . Tympanoplasty  1996  . Mastectomy partial / lumpectomy  12/28/1998    NL lumpectomy  . Mastectomy partial / lumpectomy  01/07/2010    Re-excision for + margin    Social History  reports that she has never smoked. She has never used smokeless tobacco. She reports that she does not drink alcohol or use illicit drugs.  Family History family history includes Diabetes in her mother; Heart disease in her mother; Hypertension in her father; Kidney disease in her father.  Review of Systems  Constitutional: Negative.   Eyes: Negative.   Respiratory: Negative.   Cardiovascular: Negative.   Gastrointestinal: Negative.   Musculoskeletal: Positive for gait problem.  Skin: Negative.   Neurological: Negative.   Hematological: Negative.   Psychiatric/Behavioral: Negative.   All other systems reviewed and are negative.   BP 120/60 mmHg  Pulse 95  Ht 5' (1.524 m)  Wt 141 lb (63.957 kg)  BMI 27.54 kg/m2  LMP 01/18/2000  Physical Exam  Constitutional: She is oriented to person,  place, and time. She appears well-developed and well-nourished.  HENT:  Head: Normocephalic.  Nose: Nose normal.  Mouth/Throat: Oropharynx is clear and moist.  Eyes: Conjunctivae are normal. Pupils are equal, round, and reactive to light.  Neck: Normal range of motion. Neck supple. No JVD present.  Cardiovascular: Normal rate, regular rhythm, S1 normal, S2 normal, normal heart sounds and intact distal pulses.  Exam reveals no gallop and no friction rub.   No murmur heard. Pulmonary/Chest: Effort normal and breath sounds normal. No respiratory distress. She has no wheezes. She has no rales. She exhibits no tenderness.  Abdominal: Soft. Bowel sounds are normal. She exhibits no distension. There is no tenderness.  Musculoskeletal: Normal range of motion. She exhibits no edema or tenderness.  Lymphadenopathy:    She has no cervical adenopathy.  Neurological: She is alert and oriented to person, place, and time. Coordination normal.  Skin: Skin is warm and dry. No rash noted. No erythema.  Psychiatric: She has a normal mood and affect. Her behavior is normal. Judgment and thought content normal.    Assessment and Plan  Nursing note and vitals reviewed.

## 2014-02-05 NOTE — Assessment & Plan Note (Signed)
Cholesterol is at goal on the current lipid regimen. No changes to the medications were made.  

## 2014-02-06 DIAGNOSIS — M6281 Muscle weakness (generalized): Secondary | ICD-10-CM | POA: Diagnosis not present

## 2014-02-06 DIAGNOSIS — R262 Difficulty in walking, not elsewhere classified: Secondary | ICD-10-CM | POA: Diagnosis not present

## 2014-02-06 DIAGNOSIS — Z7982 Long term (current) use of aspirin: Secondary | ICD-10-CM | POA: Diagnosis not present

## 2014-02-06 DIAGNOSIS — F0390 Unspecified dementia without behavioral disturbance: Secondary | ICD-10-CM | POA: Diagnosis not present

## 2014-02-06 DIAGNOSIS — Z9181 History of falling: Secondary | ICD-10-CM | POA: Diagnosis not present

## 2014-02-06 DIAGNOSIS — F329 Major depressive disorder, single episode, unspecified: Secondary | ICD-10-CM | POA: Diagnosis not present

## 2014-02-06 DIAGNOSIS — I10 Essential (primary) hypertension: Secondary | ICD-10-CM | POA: Diagnosis not present

## 2014-02-06 DIAGNOSIS — R296 Repeated falls: Secondary | ICD-10-CM | POA: Diagnosis not present

## 2014-02-10 ENCOUNTER — Ambulatory Visit: Payer: Medicare Other | Admitting: Family Medicine

## 2014-02-10 ENCOUNTER — Telehealth: Payer: Self-pay | Admitting: *Deleted

## 2014-02-10 NOTE — Telephone Encounter (Signed)
Yes-please ok that verbal order Thanks

## 2014-02-10 NOTE — Telephone Encounter (Signed)
Speech therapist calls requesting verbal order for continuation of speech therapy. Is this ok?

## 2014-02-10 NOTE — Telephone Encounter (Signed)
Verbal order to continue speech therapy given to [place] at [facility].

## 2014-02-12 ENCOUNTER — Encounter: Payer: Self-pay | Admitting: Family Medicine

## 2014-02-12 ENCOUNTER — Ambulatory Visit (INDEPENDENT_AMBULATORY_CARE_PROVIDER_SITE_OTHER): Payer: 59 | Admitting: Family Medicine

## 2014-02-12 VITALS — BP 122/68 | HR 101 | Temp 97.8°F | Ht 59.0 in | Wt 141.4 lb

## 2014-02-12 DIAGNOSIS — I350 Nonrheumatic aortic (valve) stenosis: Secondary | ICD-10-CM | POA: Diagnosis not present

## 2014-02-12 DIAGNOSIS — I10 Essential (primary) hypertension: Secondary | ICD-10-CM | POA: Diagnosis not present

## 2014-02-12 DIAGNOSIS — F0390 Unspecified dementia without behavioral disturbance: Secondary | ICD-10-CM

## 2014-02-12 DIAGNOSIS — R42 Dizziness and giddiness: Secondary | ICD-10-CM | POA: Diagnosis not present

## 2014-02-12 DIAGNOSIS — F329 Major depressive disorder, single episode, unspecified: Secondary | ICD-10-CM | POA: Diagnosis not present

## 2014-02-12 DIAGNOSIS — E119 Type 2 diabetes mellitus without complications: Secondary | ICD-10-CM | POA: Diagnosis not present

## 2014-02-12 DIAGNOSIS — R32 Unspecified urinary incontinence: Secondary | ICD-10-CM | POA: Diagnosis not present

## 2014-02-12 DIAGNOSIS — Z7982 Long term (current) use of aspirin: Secondary | ICD-10-CM | POA: Diagnosis not present

## 2014-02-12 DIAGNOSIS — Z9181 History of falling: Secondary | ICD-10-CM | POA: Diagnosis not present

## 2014-02-12 LAB — COMPREHENSIVE METABOLIC PANEL
ALT: 12 U/L (ref 0–35)
AST: 22 U/L (ref 0–37)
Albumin: 3.9 g/dL (ref 3.5–5.2)
Alkaline Phosphatase: 78 U/L (ref 39–117)
BUN: 17 mg/dL (ref 6–23)
CALCIUM: 9.7 mg/dL (ref 8.4–10.5)
CO2: 29 meq/L (ref 19–32)
CREATININE: 1.05 mg/dL (ref 0.40–1.20)
Chloride: 105 mEq/L (ref 96–112)
GFR: 52.97 mL/min — AB (ref >60.00)
GLUCOSE: 120 mg/dL — AB (ref 70–99)
Potassium: 4.6 mEq/L (ref 3.5–5.1)
SODIUM: 139 meq/L (ref 135–145)
TOTAL PROTEIN: 7.8 g/dL (ref 6.0–8.3)
Total Bilirubin: 0.5 mg/dL (ref 0.2–1.2)

## 2014-02-12 LAB — HEMOGLOBIN A1C: Hgb A1c MFr Bld: 6.9 % — ABNORMAL HIGH (ref 4.6–6.5)

## 2014-02-12 NOTE — Progress Notes (Signed)
Pre visit review using our clinic review tool, if applicable. No additional management support is needed unless otherwise documented below in the visit note. 

## 2014-02-12 NOTE — Progress Notes (Signed)
Subjective:    Patient ID: Courtney Castillo, female    DOB: Jul 26, 1927, 81 y.o.   MRN: [id number]  HPI Here for f/u of chronic medical problems   Dr Rockey Situ saw her  No further shortness of breath  Has appt echo on 2/1 - to investigate murmur / AS  Still dizzy-this is ongoing   Last visit had some falls-none since  Has had PT/OT -working with her for balance/ strength and speech  She forgets quickly   She feels like her memory is about the same  On aricept and namenda She states she has been eating with people  Has a boyfriend - at her home - Courtney Castillo - she likes him - spends time with him (she says)   Wt is stable with bmi is 28 Appetite is very good (she thinks too good)  She walks with her walker quite a bit   Due for A1C check  Lab Results  Component Value Date   HGBA1C 6.9* 09/27/2013    Patient Active Problem List   Diagnosis Date Noted  . Aortic valve stenosis 02/05/2014  . Dyspnea 01/08/2014  . Chronic back pain 09/27/2013  . History of fall 08/15/2012  . Laceration of face 08/15/2012  . Skin tear of upper arm without complication 74/12/8784  . Gum laceration 08/17/2011  . Aphthous ulcer of mouth 08/17/2011  . Overactive bladder 04/06/2011  . Muscle weakness (generalized) 02/21/2011  . Depression (emotion) 02/21/2011  . Pain in both knees 12/21/2010  . Knee pain 07/26/2010  . Chest wall pain 05/12/2010  . Breast cancer 05/12/2010  . Back pain 05/12/2010  . ANEMIA 09/26/2007  . Dementia 09/17/2007  . Dizzy 02/20/2007  . POLYMYALGIA RHEUMATICA 08/22/2006  . ARTHROPATHY NOS, MULTIPLE SITES 07/19/2006  . Diabetes type 2, controlled 07/11/2006  . Hyperlipidemia LDL goal <100 07/11/2006  . CATARACTS 07/11/2006  . Essential hypertension 07/11/2006  . ALLERGIC RHINITIS 07/11/2006  . REACTIVE AIRWAY DISEASE 07/11/2006  . GERD 07/11/2006  . DIVERTICULOSIS, COLON 07/11/2006  . OSTEOARTHRITIS 07/11/2006  . HX, PERSONAL, ARTHRITIS 07/10/2006  . Hx Breast cancer  ([facility]) Right, Stage one 12/18/1998    Class: Stage 1   Past Medical History  Diagnosis Date  . Diabetes mellitus     type II  . Diverticulosis of colon   . Hyperlipidemia   . Hypertension   . Arthritis     osteoarthritis knees  . GERD (gastroesophageal reflux disease)     hiatal hernia  . Allergy     allergic rhinitis/ vasometer  . Lumbar spinal stenosis 05/2007  . 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 Anemia   . Cancer     Hx of breast CA & recurrent breast CA 11/11  . Dementia 2012  . Anxiety    Past Surgical History  Procedure Laterality Date  . Carpal tunnel release    . Eye surgery      cataract extraction  . Abdominal hysterectomy  03/1997    BSO stage 1B adenocarcinoma of endometruim  . Mastectomy w/ nodes partial  12/14/2009    IDC Right, on  . Incisional breast biopsy  10/16/2009  . Shoulder surgery    . Back surgery  2009  . Tympanoplasty  1996  . Mastectomy partial / lumpectomy  12/28/1998    NL lumpectomy  . Mastectomy partial / lumpectomy  01/07/2010    Re-excision for + margin   History  Substance Use Topics  . Smoking status: Never Smoker   . Smokeless tobacco: Never Used  . Alcohol Use: No   Family History  Problem Relation Age of Onset  . Diabetes Mother   . Heart disease Mother     MI  . Kidney disease Father   . Hypertension Father    Allergies  Allergen Reactions  . Sulfamethoxazole-Trimethoprim     REACTION: Unknown reaction it has been years since patient took it and she only knows it was a Sulfa drug she took.   Current Outpatient Prescriptions on File Prior to Visit  Medication Sig Dispense Refill  . aspirin 81 MG tablet Take 1 tablet (81 mg total) by mouth daily. with meal 30 tablet 11  . atorvastatin (LIPITOR) 10 MG tablet Take 1 tablet (10 mg total) by mouth daily. 30 tablet 11  . Blood Glucose Calibration (CARESENS CONTROL A) SOLN Used as directed 1 each 11  . Blood Glucose  Monitoring Suppl ([place]) DEVI Use to check blood sugar once daily 1 Device 0  . Calcium-Vitamin A-Vitamin D (LIQUID CALCIUM PO) Take 120 mLs by mouth 2 (two) times daily.    Marland Kitchen CLEVER CHEK AUTO-CODE VOICE test strip TEST ONCE DAILY 100 each 2  . donepezil (ARICEPT) 10 MG tablet Take 1 tablet (10 mg total) by mouth at bedtime. 30 tablet 5  . feeding supplement (ENSURE IMMUNE HEALTH) LIQD Drink entire contents of 237 MLs [1-can] by mouth twice daily. 60 Bottle 11  . LITE TOUCH LANCETS MISC Test blood sugar once daily for DM 250.0 100 each 3  . losartan (COZAAR) 50 MG tablet Take 50 mg by mouth daily.    . memantine (NAMENDA XR) 14 MG CP24 24 hr capsule Take 14 mg by mouth daily.    . Multiple Vitamin (MULTIVITAMIN) tablet Take 1 tablet by mouth daily. 30 tablet 11  . oxybutynin (DITROPAN-XL) 5 MG 24 hr tablet Take 5 mg by mouth daily.    . sertraline (ZOLOFT) 50 MG tablet Take 1 tablet (50 mg total) by mouth daily. 30 tablet 3  . travoprost, benzalkonium, (TRAVATAN) 0.004 % ophthalmic solution Place 1 drop into both eyes 2 (two) times daily. 2.5 mL 5   No current facility-administered medications on file prior to visit.     Review of Systems Review of Systems  Constitutional: Negative for fever, appetite change, fatigue and unexpected weight change.  Eyes: Negative for pain and visual disturbance.  Respiratory: Negative for cough and shortness of breath.   Cardiovascular: Negative for cp or palpitations    Gastrointestinal: Negative for nausea, diarrhea and constipation.  Genitourinary: Negative for urgency and frequency.  MSK pos for chronic back pain and mobility issues  Skin: Negative for pallor or rash   Neurological: Negative for weakness,  numbness and headaches. pos for chronic dizziness/ ataxia Hematological: Negative for adenopathy. Does not bruise/bleed easily.  Psychiatric/Behavioral: Negative for dysphoric mood. The patient is  nervous/anxious.           Objective:   Physical Exam  Constitutional: She appears well-developed and well-nourished. No distress.  overwt and well appearing    HENT:  Head: Normocephalic and atraumatic.  Mouth/Throat: Oropharynx is clear and moist.  Eyes: Conjunctivae and EOM are normal. Pupils are equal, round, and reactive to light. No scleral icterus.  Neck: Normal range of motion. Neck supple. No JVD present. Carotid bruit is not present. No thyromegaly present.  Cardiovascular: Normal rate, regular rhythm and intact distal pulses.  Exam reveals no gallop.   Murmur heard. Loud systolic M  Pulmonary/Chest: Effort normal and breath sounds normal. No respiratory distress. She has no wheezes. She has no rales.  Abdominal: Soft. Bowel sounds are normal. She exhibits no distension and no mass. There is no tenderness.  Musculoskeletal: She exhibits no edema or tenderness.  Lymphadenopathy:    She has no cervical adenopathy.  Neurological: She is alert. She has normal reflexes. No cranial nerve deficit. She exhibits normal muscle tone. Coordination abnormal.  Walks fairly well with walker   Skin: Skin is warm and dry. No rash noted. No erythema. No pallor.  Psychiatric: She has a normal mood and affect.          Assessment & Plan:   Problem List Items Addressed This Visit      Cardiovascular and Mediastinum   Aortic valve stenosis    Pt has seen cardiology and has echocardiogram upcoming  No further episodes of sob       Essential hypertension - Primary    bp in fair control at this time  BP Readings from Last 1 Encounters:  02/12/14 122/68   No changes needed Disc lifstyle change with low sodium diet and exercise   Will continue to follow  Chem labs today      Relevant Orders   Comprehensive metabolic panel (Completed)     Endocrine   Diabetes type 2, controlled    Due for A1C check On DM (limited sugar) diet at her nursing home  Has gained wt with inc po intake        Relevant  Orders   Hemoglobin A1c (Completed)     Nervous and Auditory   Dementia    Overall progressing with poor short term memory  However mood remains good  Has good appetite and also stays active and social at her residence  On aricept and namenda without problems         Other   Dizzy    Chronic and multifactorial  Has been worked up more than once  Doing well with PT - walking with walker and no falls recently  Rev meds  will continue to follow

## 2014-02-12 NOTE — Patient Instructions (Addendum)
Labs today for chemistry and diabetes  Stay active  You have appt for a heart test (echocardiogram) on 02/17/14 You will see me on 03/28/14 Take care of yourself  Keep using your walker to prevent falls

## 2014-02-13 ENCOUNTER — Telehealth: Payer: Self-pay

## 2014-02-13 ENCOUNTER — Telehealth: Payer: Self-pay | Admitting: Family Medicine

## 2014-02-13 ENCOUNTER — Encounter: Payer: Self-pay | Admitting: *Deleted

## 2014-02-13 NOTE — Telephone Encounter (Signed)
emmi emailed °

## 2014-02-13 NOTE — Assessment & Plan Note (Signed)
Chronic and multifactorial  Has been worked up more than once  Doing well with PT - walking with walker and no falls recently  Rev meds  will continue to follow

## 2014-02-13 NOTE — Telephone Encounter (Signed)
Please verbally ok that Thanks

## 2014-02-13 NOTE — Telephone Encounter (Signed)
Apolonio Schneiders nurse with [facility] left v/m requesting verbal order for home health nursing 1 x a week for 3 weeks. Please advise.

## 2014-02-13 NOTE — Assessment & Plan Note (Signed)
Overall progressing with poor short term memory  However mood remains good  Has good appetite and also stays active and social at her residence  On aricept and namenda without problems

## 2014-02-13 NOTE — Assessment & Plan Note (Signed)
Due for A1C check On DM (limited sugar) diet at her nursing home  Has gained wt with inc po intake

## 2014-02-13 NOTE — Assessment & Plan Note (Signed)
Pt has seen cardiology and has echocardiogram upcoming  No further episodes of sob

## 2014-02-13 NOTE — Assessment & Plan Note (Signed)
bp in fair control at this time  BP Readings from Last 1 Encounters:  02/12/14 122/68   No changes needed Disc lifstyle change with low sodium diet and exercise   Will continue to follow  Chem labs today

## 2014-02-14 NOTE — Telephone Encounter (Signed)
Apolonio Schneiders notified as instructed and Apolonio Schneiders voiced understanding.

## 2014-02-17 ENCOUNTER — Other Ambulatory Visit: Payer: Self-pay

## 2014-02-17 ENCOUNTER — Other Ambulatory Visit (INDEPENDENT_AMBULATORY_CARE_PROVIDER_SITE_OTHER): Payer: 59

## 2014-02-17 DIAGNOSIS — R42 Dizziness and giddiness: Secondary | ICD-10-CM

## 2014-02-17 DIAGNOSIS — R0602 Shortness of breath: Secondary | ICD-10-CM

## 2014-02-17 DIAGNOSIS — I350 Nonrheumatic aortic (valve) stenosis: Secondary | ICD-10-CM

## 2014-02-17 DIAGNOSIS — R0789 Other chest pain: Secondary | ICD-10-CM | POA: Diagnosis not present

## 2014-02-17 DIAGNOSIS — R0902 Hypoxemia: Secondary | ICD-10-CM | POA: Diagnosis not present

## 2014-02-18 DIAGNOSIS — R32 Unspecified urinary incontinence: Secondary | ICD-10-CM | POA: Diagnosis not present

## 2014-02-18 DIAGNOSIS — F0390 Unspecified dementia without behavioral disturbance: Secondary | ICD-10-CM | POA: Diagnosis not present

## 2014-02-18 DIAGNOSIS — I10 Essential (primary) hypertension: Secondary | ICD-10-CM | POA: Diagnosis not present

## 2014-02-18 DIAGNOSIS — Z9181 History of falling: Secondary | ICD-10-CM | POA: Diagnosis not present

## 2014-02-18 DIAGNOSIS — I350 Nonrheumatic aortic (valve) stenosis: Secondary | ICD-10-CM | POA: Diagnosis not present

## 2014-02-18 DIAGNOSIS — F329 Major depressive disorder, single episode, unspecified: Secondary | ICD-10-CM | POA: Diagnosis not present

## 2014-02-18 DIAGNOSIS — Z7982 Long term (current) use of aspirin: Secondary | ICD-10-CM | POA: Diagnosis not present

## 2014-02-20 DIAGNOSIS — F0390 Unspecified dementia without behavioral disturbance: Secondary | ICD-10-CM | POA: Diagnosis not present

## 2014-02-20 DIAGNOSIS — I10 Essential (primary) hypertension: Secondary | ICD-10-CM | POA: Diagnosis not present

## 2014-02-20 DIAGNOSIS — R32 Unspecified urinary incontinence: Secondary | ICD-10-CM | POA: Diagnosis not present

## 2014-02-20 DIAGNOSIS — I350 Nonrheumatic aortic (valve) stenosis: Secondary | ICD-10-CM | POA: Diagnosis not present

## 2014-02-20 DIAGNOSIS — F329 Major depressive disorder, single episode, unspecified: Secondary | ICD-10-CM | POA: Diagnosis not present

## 2014-02-20 DIAGNOSIS — Z7982 Long term (current) use of aspirin: Secondary | ICD-10-CM | POA: Diagnosis not present

## 2014-02-20 DIAGNOSIS — Z9181 History of falling: Secondary | ICD-10-CM | POA: Diagnosis not present

## 2014-02-21 ENCOUNTER — Telehealth: Payer: Self-pay

## 2014-02-21 DIAGNOSIS — I350 Nonrheumatic aortic (valve) stenosis: Secondary | ICD-10-CM | POA: Diagnosis not present

## 2014-02-21 DIAGNOSIS — R32 Unspecified urinary incontinence: Secondary | ICD-10-CM | POA: Diagnosis not present

## 2014-02-21 DIAGNOSIS — I10 Essential (primary) hypertension: Secondary | ICD-10-CM | POA: Diagnosis not present

## 2014-02-21 DIAGNOSIS — F329 Major depressive disorder, single episode, unspecified: Secondary | ICD-10-CM | POA: Diagnosis not present

## 2014-02-21 DIAGNOSIS — Z9181 History of falling: Secondary | ICD-10-CM | POA: Diagnosis not present

## 2014-02-21 DIAGNOSIS — Z7982 Long term (current) use of aspirin: Secondary | ICD-10-CM | POA: Diagnosis not present

## 2014-02-21 DIAGNOSIS — F0390 Unspecified dementia without behavioral disturbance: Secondary | ICD-10-CM | POA: Diagnosis not present

## 2014-02-21 NOTE — Telephone Encounter (Signed)
Santiago Glad with [facility] left v/m at 5:01 requesting cb; called [facility] back and was transferred to [employer]; phone rang multiple times with no answer and no v/m.

## 2014-02-21 NOTE — Telephone Encounter (Signed)
I do not see recent norco px either - perhaps it came from elsewhere?

## 2014-02-21 NOTE — Telephone Encounter (Signed)
Aurora Mask with [facility] in [place] left v/m that rx for Norco sent in few weeks ago did not have a quantity on rx Santiago Glad said pharmacy did not f/u on) and request another prescription for Norco.I do not see Norco recently prescribed on med list. Crystal cannot find Santiago Glad and will give karen message to cb.

## 2014-02-24 NOTE — Telephone Encounter (Signed)
Spoken wit Santiago Glad with [facility]. Notified her of Dr. Marliss Coots comments. Santiago Glad will check with patient's other doctor.

## 2014-02-25 DIAGNOSIS — Z9181 History of falling: Secondary | ICD-10-CM | POA: Diagnosis not present

## 2014-02-25 DIAGNOSIS — I350 Nonrheumatic aortic (valve) stenosis: Secondary | ICD-10-CM | POA: Diagnosis not present

## 2014-02-25 DIAGNOSIS — Z7982 Long term (current) use of aspirin: Secondary | ICD-10-CM | POA: Diagnosis not present

## 2014-02-25 DIAGNOSIS — F0390 Unspecified dementia without behavioral disturbance: Secondary | ICD-10-CM | POA: Diagnosis not present

## 2014-02-25 DIAGNOSIS — R32 Unspecified urinary incontinence: Secondary | ICD-10-CM | POA: Diagnosis not present

## 2014-02-25 DIAGNOSIS — I10 Essential (primary) hypertension: Secondary | ICD-10-CM | POA: Diagnosis not present

## 2014-02-25 DIAGNOSIS — F329 Major depressive disorder, single episode, unspecified: Secondary | ICD-10-CM | POA: Diagnosis not present

## 2014-02-27 DIAGNOSIS — F329 Major depressive disorder, single episode, unspecified: Secondary | ICD-10-CM | POA: Diagnosis not present

## 2014-02-27 DIAGNOSIS — I350 Nonrheumatic aortic (valve) stenosis: Secondary | ICD-10-CM | POA: Diagnosis not present

## 2014-02-27 DIAGNOSIS — F0390 Unspecified dementia without behavioral disturbance: Secondary | ICD-10-CM | POA: Diagnosis not present

## 2014-02-27 DIAGNOSIS — Z7982 Long term (current) use of aspirin: Secondary | ICD-10-CM | POA: Diagnosis not present

## 2014-02-27 DIAGNOSIS — I10 Essential (primary) hypertension: Secondary | ICD-10-CM | POA: Diagnosis not present

## 2014-02-27 DIAGNOSIS — Z9181 History of falling: Secondary | ICD-10-CM | POA: Diagnosis not present

## 2014-02-27 DIAGNOSIS — R32 Unspecified urinary incontinence: Secondary | ICD-10-CM | POA: Diagnosis not present

## 2014-03-05 DIAGNOSIS — F0390 Unspecified dementia without behavioral disturbance: Secondary | ICD-10-CM | POA: Diagnosis not present

## 2014-03-05 DIAGNOSIS — F329 Major depressive disorder, single episode, unspecified: Secondary | ICD-10-CM | POA: Diagnosis not present

## 2014-03-05 DIAGNOSIS — I350 Nonrheumatic aortic (valve) stenosis: Secondary | ICD-10-CM | POA: Diagnosis not present

## 2014-03-05 DIAGNOSIS — R32 Unspecified urinary incontinence: Secondary | ICD-10-CM | POA: Diagnosis not present

## 2014-03-05 DIAGNOSIS — Z7982 Long term (current) use of aspirin: Secondary | ICD-10-CM | POA: Diagnosis not present

## 2014-03-05 DIAGNOSIS — I10 Essential (primary) hypertension: Secondary | ICD-10-CM | POA: Diagnosis not present

## 2014-03-05 DIAGNOSIS — Z9181 History of falling: Secondary | ICD-10-CM | POA: Diagnosis not present

## 2014-03-06 DIAGNOSIS — F0390 Unspecified dementia without behavioral disturbance: Secondary | ICD-10-CM | POA: Diagnosis not present

## 2014-03-06 DIAGNOSIS — F329 Major depressive disorder, single episode, unspecified: Secondary | ICD-10-CM

## 2014-03-06 DIAGNOSIS — I10 Essential (primary) hypertension: Secondary | ICD-10-CM

## 2014-03-06 DIAGNOSIS — I350 Nonrheumatic aortic (valve) stenosis: Secondary | ICD-10-CM

## 2014-03-07 DIAGNOSIS — I350 Nonrheumatic aortic (valve) stenosis: Secondary | ICD-10-CM | POA: Diagnosis not present

## 2014-03-07 DIAGNOSIS — F0390 Unspecified dementia without behavioral disturbance: Secondary | ICD-10-CM | POA: Diagnosis not present

## 2014-03-07 DIAGNOSIS — F329 Major depressive disorder, single episode, unspecified: Secondary | ICD-10-CM | POA: Diagnosis not present

## 2014-03-07 DIAGNOSIS — Z7982 Long term (current) use of aspirin: Secondary | ICD-10-CM | POA: Diagnosis not present

## 2014-03-07 DIAGNOSIS — R32 Unspecified urinary incontinence: Secondary | ICD-10-CM | POA: Diagnosis not present

## 2014-03-07 DIAGNOSIS — I10 Essential (primary) hypertension: Secondary | ICD-10-CM | POA: Diagnosis not present

## 2014-03-07 DIAGNOSIS — Z9181 History of falling: Secondary | ICD-10-CM | POA: Diagnosis not present

## 2014-03-25 ENCOUNTER — Telehealth: Payer: Self-pay

## 2014-03-25 NOTE — Telephone Encounter (Signed)
So I assume he means she does not qualify for medicaid and thus memory care would not be affordable ?   Please advise him to see if he can talk to a social worker - [facility] may have one or they could give him a number.  She does need to be in a facility that can provide memory care for safety.   Please have him check on that and send this back to me so I can call him-thanks

## 2014-03-25 NOTE — Telephone Encounter (Signed)
Spoke with [name], he said they checked the patients urine and it was fine.  Advised him to contact a Education officer, museum about memory care.  He will do this and will contact us if he needs anything further.

## 2014-03-25 NOTE — Telephone Encounter (Signed)
Ok - I will look forward from hearing from him after he speaks with someone in social work

## 2014-03-25 NOTE — Telephone Encounter (Signed)
[name] pts nephew left v/m;pt is presently at nursing home in [place]; pts confusion and memory is worsening; pt puts her clothes on backwards and walks around looking for her father. Pt gets too much money for memory care. [name] request cb with what is next step.

## 2014-03-28 ENCOUNTER — Ambulatory Visit: Payer: Medicare Other | Admitting: Family Medicine

## 2014-04-04 ENCOUNTER — Encounter: Payer: Self-pay | Admitting: Family Medicine

## 2014-04-04 ENCOUNTER — Ambulatory Visit (INDEPENDENT_AMBULATORY_CARE_PROVIDER_SITE_OTHER): Payer: Medicare Other | Admitting: Family Medicine

## 2014-04-04 VITALS — BP 120/70 | HR 100 | Temp 98.0°F | Wt 140.0 lb

## 2014-04-04 DIAGNOSIS — F0390 Unspecified dementia without behavioral disturbance: Secondary | ICD-10-CM

## 2014-04-04 DIAGNOSIS — E119 Type 2 diabetes mellitus without complications: Secondary | ICD-10-CM

## 2014-04-04 DIAGNOSIS — I10 Essential (primary) hypertension: Secondary | ICD-10-CM | POA: Diagnosis not present

## 2014-04-04 MED ORDER — MEMANTINE HCL ER 21 MG PO CP24: 21.0000 mg | ORAL_CAPSULE | Freq: Every day | ORAL | Status: AC

## 2014-04-04 NOTE — Progress Notes (Signed)
Subjective:    Patient ID: Courtney Castillo, female    DOB: May 23, 1927, 81 y.o.   MRN: [id number]  HPI Here for f/u of chronic problems    Per pt - a little tired/nothing new  Her POA called - and is looking into memory care for her dementia   bp is stable today  No cp or palpitations or headaches or edema  No side effects to medicines  BP Readings from Last 3 Encounters:  04/04/14 120/70  02/12/14 122/68  02/05/14 120/60     Wt is stable -no change  Appetite is great , loves to eat   DM2 is stable Lab Results  Component Value Date   HGBA1C 6.9* 02/12/2014    No change at all   No falls since last time   ua recent was ok   Echocardiogram - showed moderate AS (it worsened from mild)-will continue to watch No syncope occ mild sob-per pt overall improved   Dementia- aware her memory is worse with time  Is on 14 mg of namenda xr right now  On aricept  Tolerating both  Her nephew is discussing care options with social work -re: need for memory care for safety   Pt has no c/o today  Patient Active Problem List   Diagnosis Date Noted  . Aortic valve stenosis 02/05/2014  . Dyspnea 01/08/2014  . Chronic back pain 09/27/2013  . History of fall 08/15/2012  . Laceration of face 08/15/2012  . Skin tear of upper arm without complication 42/39/5320  . Gum laceration 08/17/2011  . Aphthous ulcer of mouth 08/17/2011  . Overactive bladder 04/06/2011  . Muscle weakness (generalized) 02/21/2011  . Depression (emotion) 02/21/2011  . Pain in both knees 12/21/2010  . Knee pain 07/26/2010  . Chest wall pain 05/12/2010  . Breast cancer 05/12/2010  . Back pain 05/12/2010  . ANEMIA 09/26/2007  . Dementia 09/17/2007  . Dizzy 02/20/2007  . POLYMYALGIA RHEUMATICA 08/22/2006  . ARTHROPATHY NOS, MULTIPLE SITES 07/19/2006  . Diabetes type 2, controlled 07/11/2006  . Hyperlipidemia LDL goal <100 07/11/2006  . CATARACTS 07/11/2006  . Essential hypertension 07/11/2006  . ALLERGIC  RHINITIS 07/11/2006  . REACTIVE AIRWAY DISEASE 07/11/2006  . GERD 07/11/2006  . DIVERTICULOSIS, COLON 07/11/2006  . OSTEOARTHRITIS 07/11/2006  . HX, PERSONAL, ARTHRITIS 07/10/2006  . Hx Breast cancer ([facility]) Right, Stage one 12/18/1998    Class: Stage 1   Past Medical History  Diagnosis Date  . Diabetes mellitus     type II  . Diverticulosis of colon   . Hyperlipidemia   . Hypertension   . Arthritis     osteoarthritis knees  . GERD (gastroesophageal reflux disease)     hiatal hernia  . Allergy     allergic rhinitis/ vasometer  . Lumbar spinal stenosis 05/2007  . 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 Anemia   . Cancer     Hx of breast CA & recurrent breast CA 11/11  . Dementia 2012  . Anxiety    Past Surgical History  Procedure Laterality Date  . Carpal tunnel release    . Eye surgery      cataract extraction  . Abdominal hysterectomy  03/1997    BSO stage 1B adenocarcinoma of endometruim  . Mastectomy w/ nodes partial  12/14/2009    ID[facility] Right, on  . Incisional breast biopsy  10/16/2009  . Shoulder surgery    .  Back surgery  2009  . Tympanoplasty  1996  . Mastectomy partial / lumpectomy  12/28/1998    NL lumpectomy  . Mastectomy partial / lumpectomy  01/07/2010    Re-excision for + margin   History  Substance Use Topics  . Smoking status: Never Smoker   . Smokeless tobacco: Never Used  . Alcohol Use: No   Family History  Problem Relation Age of Onset  . Diabetes Mother   . Heart disease Mother     MI  . Kidney disease Father   . Hypertension Father    Allergies  Allergen Reactions  . Sulfamethoxazole-Trimethoprim     REACTION: Unknown reaction it has been years since patient took it and she only knows it was a Sulfa drug she took.   Current Outpatient Prescriptions on File Prior to Visit  Medication Sig Dispense Refill  . aspirin 81 MG tablet Take 1 tablet (81 mg total) by mouth daily. with meal 30 tablet 11    . atorvastatin (LIPITOR) 10 MG tablet Take 1 tablet (10 mg total) by mouth daily. 30 tablet 11  . Blood Glucose Calibration (CARESENS CONTROL A) SOLN Used as directed 1 each 11  . Blood Glucose Monitoring Suppl ([place]) DEVI Use to check blood sugar once daily 1 Device 0  . Calcium-Vitamin A-Vitamin D (LIQUID CALCIUM PO) Take 120 mLs by mouth 2 (two) times daily.    Marland Kitchen CLEVER CHEK AUTO-CODE VOICE test strip TEST ONCE DAILY 100 each 2  . donepezil (ARICEPT) 10 MG tablet Take 1 tablet (10 mg total) by mouth at bedtime. 30 tablet 5  . feeding supplement (ENSURE IMMUNE HEALTH) LIQD Drink entire contents of 237 MLs [1-can] by mouth twice daily. 60 Bottle 11  . LITE TOUCH LANCETS MISC Test blood sugar once daily for DM 250.0 100 each 3  . losartan (COZAAR) 50 MG tablet Take 50 mg by mouth daily.    . Multiple Vitamin (MULTIVITAMIN) tablet Take 1 tablet by mouth daily. 30 tablet 11  . oxybutynin (DITROPAN-XL) 5 MG 24 hr tablet Take 5 mg by mouth daily.    . sertraline (ZOLOFT) 50 MG tablet Take 1 tablet (50 mg total) by mouth daily. 30 tablet 3  . travoprost, benzalkonium, (TRAVATAN) 0.004 % ophthalmic solution Place 1 drop into both eyes 2 (two) times daily. 2.5 mL 5   No current facility-administered medications on file prior to visit.     Review of Systems Review of Systems  Constitutional: Negative for fever, appetite change, and unexpected weight change. pos for occ fatigue  Eyes: Negative for pain and visual disturbance.  Respiratory: Negative for cough and shortness of breath.   Cardiovascular: Negative for cp or palpitations    Gastrointestinal: Negative for nausea, diarrhea and constipation.  Genitourinary: Negative for urgency and frequency.  Skin: Negative for pallor or rash   Neurological: Negative for weakness, numbness and headaches. pos for chronic multifactorial dizziness with prev work up  Hematological: Negative for adenopathy. Does not bruise/bleed easily.   Psychiatric/Behavioral: Negative for dysphoric mood. The patient is anxious at times, pos for dementia which is worsening        Objective:   Physical Exam  Constitutional: She appears well-developed and well-nourished. No distress.  Frail appearing elderly female who walks well with walker    HENT:  Head: Normocephalic and atraumatic.  Right Ear: External ear normal.  Left Ear: External ear normal.  Nose: Nose normal.  Mouth/Throat: Oropharynx is clear and moist.  Eyes: Conjunctivae and EOM are normal. Pupils are equal, round, and reactive to light. Right eye exhibits no discharge. Left eye exhibits no discharge. No scleral icterus.  Neck: Normal range of motion. Neck supple. No JVD present. Carotid bruit is not present. No thyromegaly present.  Cardiovascular: Normal rate, regular rhythm and intact distal pulses.  Exam reveals no gallop.   Murmur heard. Pulmonary/Chest: Effort normal and breath sounds normal. No respiratory distress. She has no wheezes. She has no rales. She exhibits no tenderness.  Abdominal: Soft. Bowel sounds are normal. She exhibits no distension, no abdominal bruit and no mass. There is no tenderness.  Genitourinary: No breast swelling, tenderness, discharge or bleeding.  Musculoskeletal: Normal range of motion. She exhibits no edema or tenderness.  Lymphadenopathy:    She has no cervical adenopathy.  Neurological: She is alert. She has normal reflexes. No cranial nerve deficit. She exhibits normal muscle tone. Coordination normal.  Skin: Skin is warm and dry. No rash noted. No erythema. No pallor.  Psychiatric: She has a normal mood and affect. Her mood appears not anxious. Her affect is not blunt and not labile. Her speech is tangential. She is not agitated, not withdrawn and not actively hallucinating. Thought content is not paranoid. Cognition and memory are impaired. She does not exhibit a depressed mood. She expresses no homicidal and no suicidal ideation.  She exhibits abnormal recent memory.  Baseline dementia  Speech is tangential today Pleasant and content today          Assessment & Plan:   Problem List Items Addressed This Visit      Cardiovascular and Mediastinum   Essential hypertension - Primary    bp in fair control at this time  BP Readings from Last 1 Encounters:  04/04/14 120/70   No changes needed Disc lifstyle change with low sodium diet and exercise  Labs reviewed         Endocrine   Diabetes type 2, controlled    Lab Results  Component Value Date   HGBA1C 6.9* 02/12/2014   Appetite is ok despite dementia  Goes to dining room for meals and not given sweets or deserts  Enc activity-she walks with walker  Needs prompting in light of dementia  Disc foot care-needs help trimming toenails - did work on R great toenail today which is quite thick and long        Nervous and Auditory   Dementia    This is gradually worsening  On aricept  Will inc her namenda xr to mg21 daily and watch closely for side eff Her POA/ nephew [name] is disc opt for further care with social work- for safety pt will need a memory care unit in the future  Pt is aware her memory is getting worse  F/u planned        Relevant Medications   Memantine HCl ER 21 MG CP24

## 2014-04-04 NOTE — Patient Instructions (Signed)
Please increase your namenda xr to 21 mg daily  Have the staff at [facility] alert me if any problems or side effects Can someone  there help you trim toenails ? We worked on the right great toenail today and made a little progress Follow up in 3 months

## 2014-04-04 NOTE — Progress Notes (Signed)
Pre visit review using our clinic review tool, if applicable. No additional management support is needed unless otherwise documented below in the visit note. 

## 2014-04-06 NOTE — Assessment & Plan Note (Signed)
This is gradually worsening  On aricept  Will inc her namenda xr to mg21 daily and watch closely for side eff Her POA/ nephew [name] is disc opt for further care with social work- for safety pt will need a memory care unit in the future  Pt is aware her memory is getting worse  F/u planned

## 2014-04-06 NOTE — Assessment & Plan Note (Addendum)
Lab Results  Component Value Date   HGBA1C 6.9* 02/12/2014   Appetite is ok despite dementia  Goes to dining room for meals and not given sweets or deserts  Enc activity-she walks with walker  Needs prompting in light of dementia  Disc foot care-needs help trimming toenails - did work on R great toenail today which is quite thick and long

## 2014-04-06 NOTE — Assessment & Plan Note (Signed)
bp in fair control at this time  BP Readings from Last 1 Encounters:  04/04/14 120/70   No changes needed Disc lifstyle change with low sodium diet and exercise  Labs reviewed

## 2014-04-18 DIAGNOSIS — J189 Pneumonia, unspecified organism: Secondary | ICD-10-CM | POA: Diagnosis not present

## 2014-04-18 DIAGNOSIS — F339 Major depressive disorder, recurrent, unspecified: Secondary | ICD-10-CM | POA: Diagnosis not present

## 2014-04-18 DIAGNOSIS — I10 Essential (primary) hypertension: Secondary | ICD-10-CM | POA: Diagnosis not present

## 2014-04-18 DIAGNOSIS — F0390 Unspecified dementia without behavioral disturbance: Secondary | ICD-10-CM | POA: Diagnosis not present

## 2014-04-18 DIAGNOSIS — R262 Difficulty in walking, not elsewhere classified: Secondary | ICD-10-CM | POA: Diagnosis not present

## 2014-04-18 DIAGNOSIS — M6281 Muscle weakness (generalized): Secondary | ICD-10-CM | POA: Diagnosis not present

## 2014-05-06 NOTE — Discharge Summary (Signed)
PATIENT NAME:  Courtney Castillo, Courtney Castillo MR#:  [id number] DATE OF BIRTH:  12-29-1927  DATE OF ADMISSION:  11/24/2011 DATE OF DISCHARGE:  11/25/2011  DISCHARGE DIAGNOSES:  1. Systemic inflammatory response syndrome secondary to pneumonia, resolving.  2. Hypertension.  3. Dementia.  4. Depression.   DISCHARGE MEDICATIONS:  1. Amlodipine 5 mg daily.  2. Aspirin 81 mg daily.  3. Diovan 80 mg, 1-1/2 tablets in the morning. 4. Oxybutynin 5 mg p.o. daily.  5. Zoloft 50 mg p.o. daily.  6. Travatan eye drops in each eye 2 times a day. 7. Aricept 10 mg daily.  8. MPAP 325 mg,  2 tablets every 6 hours as needed.  9. Mintox oral suspension, 5 mL 3 times a day. 10. Levofloxacin 500 mg daily for 7 days.   DIET: Low sodium diet.   CONSULTATIONS: Physical Therapy consult.   HOSPITAL COURSE: The patient is an 81 year old female with hypertension, dementia, admitted because of fever, cough, and vomiting.  Look at the History and Physical for full details. The patient did admit to cough, feeling her heart, and vomiting. The patient complained of some abdominal pain. When she came she  also was tachycardic with heart rate up to 114, blood pressure 158/68, and O2 saturations 96%.  Chest x-ray on admission showed haziness in the lower regions bilaterally, likely secondary to pneumonia with pleural effusion. The patient's EKG shows sinus tachycardia with 1 or 2 beats per minute. She was admitted for pneumonia and SIRS due to pneumonia, started on IV Levaquin, IV fluids. She had one episode of vomiting, but that resolved after admission starting IV fluids, IV antibiotics. The patient's blood cultures have been negative. She is afebrile since admission, and white count initially was at 15.8, but this morning we repeated the white count, which is 11.6. The patient's echocardiogram also is done because of elevated BNP on admission. The patient did not have any signs of congestive heart even though the x-ray showed congestive  heart failure. The patient is not hypoxic, has no wheezing, and BNP was 6175 on admission. Echocardiogram showed normal ejection fraction. The patient had abdominal pain when she came, so ultrasound was done which showed mobile gallstones with no evidence of cholecystitis. The patient also had a CAT scan of the abdomen and pelvis that  showed gallbladder only partially distended with radiodense stones. The patient has no evidence of hepatobiliary abnormality. Surgical consult was not requested because she was tolerating the diet, and nausea and vomiting were not present anymore. The patient is from [facility]. She will be going back there once she is seen by Physical Therapy to see if she can go back there or not. The patient will be going to [facility] if okay with Physical Therapy. The patient will continue the Levaquin.   DISCHARGE VITAL SIGNS:  Discharge vitals are stable.   TIME SPENT:   Time spent on discharge preparation was more than 30 minutes.   ____________________________ Epifanio Lesches, MD sk:cbb D: 11/25/2011 14:26:36 ET T: 11/26/2011 16:06:08 ET JOB#: [id number]  cc: Epifanio Lesches, MD, <Dictator> Epifanio Lesches MD ELECTRONICALLY SIGNED 12/13/2011 11:51

## 2014-05-06 NOTE — H&P (Signed)
PATIENT NAME:  Courtney Castillo, Courtney Castillo MR#:  [id number] DATE OF BIRTH:  12-Jul-1927  DATE OF ADMISSION:  11/24/2011  PRIMARY CARE PHYSICIAN: Dr. Loura Pardon  REFERRING PHYSICIAN: Dr. Elyn Peers  CHIEF COMPLAINT: Cough, feeling hot, vomiting.   HISTORY OF PRESENT ILLNESS: Courtney Castillo is an 81 year old pleasant Caucasian female which lives at assisted living facility was brought to the hospital for evaluation of being found vomiting. She was also complaining of dry cough and she is feeling hot. Evaluation here reveals that the patient is tachycardiac and she has bilateral lung fine rales. Patient has mild to moderate dementia and did not give me adequate history other than she has arthritis in her knees and sometimes they give up but she did report to me that she vomited once and she agrees with me that she does have cough lately. She denies having any chest pain. Denies abdominal pain but she states that she had belly pain earlier when they were examining her.   REVIEW OF SYSTEMS: CONSTITUTIONAL: Unsure if she had fever or not. She tells me she felt hot. No chills. She has mild fatigue. EYES: No blurring of vision. No double vision. ENT: No hearing impairment. No sore throat. No dysphagia. CARDIOVASCULAR: No chest pain. Has mild shortness of breath. No edema. No syncope. RESPIRATORY: She has dry cough. No chest pain. She has mild shortness of breath. No hemoptysis. GASTROINTESTINAL: No abdominal pain. But earlier had little tenderness as she says ; however, she is nontender now. No diarrhea. No hematochezia. No melena. She had nausea and vomiting earlier. GENITOURINARY: No dysuria. No frequency of urination. MUSCULOSKELETAL: No joint pain or swelling other than her bilateral knee joint pain, this is chronic. No muscular pain or swelling. INTEGUMENTARY: No skin rash. No ulcers. NEUROLOGY: No focal weakness. No seizure activity. No headache. PSYCHIATRY: No anxiety or depression. ENDOCRINE: No polyuria or polydipsia. No  heat or cold intolerance.   PAST MEDICAL HISTORY:  1. Mild dementia. 2. Non-insulin-dependent diabetes mellitus, on no treatment.  3. Chronic kidney disease.  4. Hyperlipidemia.  5. Glaucoma. 6. Anemia. 7. Hypertension. 8. History of breast cancer.   PAST SURGICAL HISTORY:  1. Right mastectomy.  2. Hysterectomy.   SOCIAL HABITS: Nonsmoker. No history of alcohol or drug abuse.   SOCIAL HISTORY: She is widowed. Lives at assisted nursing facility alone.   FAMILY HISTORY: Unobtainable from the patient due to dementia but according to the records there is a family history of diabetes.   ADMISSION MEDICATIONS:  1. Amlodipine 5 mg a day. 2. Aspirin 81 mg a day. 3. Diovan 80 mg taking 1-1/2 tablets, that is 120 mg once a day. 4. Donepezil 10 mg a day.  5. Tylenol p.r.n.  6. Mintox oral suspension p.r.n. for pain.  7. Oxybutynin 5 mg once a day. 8. Sertraline 50 mg once a day.  9. Vitamin once a day.  10. Travatan 0.004% ophthalmic drops one drop in each eye twice a day.   ALLERGIES: Sulfa.   PHYSICAL EXAMINATION:  VITAL SIGNS: Blood pressure 158/68, respiratory rate 20, pulse 114, temperature 98.6, oxygen saturation 96%.   GENERAL APPEARANCE: Elderly female lying in bed in no acute distress.   HEAD AND NECK: No pallor. No icterus. No cyanosis.   ENT: Hearing was normal. Nasal mucosa, lips, tongue were normal. She is edentulous. Had dentures.   EYES: Eye examination revealed normal eyelids and conjunctivae. Pupils about 6 mm, equal and reactive to light.   NECK: Supple. Trachea at midline. No thyromegaly.  No masses.   HEART: Normal S1, S2. No S3 or S4. There is systolic murmur grade 2/6 at the apex and left sternal border. No carotid bruits.   RESPIRATORY: Normal breathing pattern without use of accessory muscles. There are fine rales 1/3 on lower lung zone bilaterally.   ABDOMEN: Soft without tenderness. No hepatosplenomegaly. No masses. No hernias. Murphy's sign was  negative.   MUSCULOSKELETAL: No joint swelling. No clubbing.   SKIN: No ulcers. No subcutaneous nodules.   NEUROLOGIC: Cranial nerves II through XII are intact. No focal motor deficit.   PSYCHIATRIC: Patient is alert, oriented to the place and to the people. Mood and affect were normal.   LABORATORY, DIAGNOSTIC, AND RADIOLOGICAL DATA: Chest x-ray showed haziness at the lower zone bilaterally either secondary to pneumonia versus pleural effusion. EKG showed sinus tachycardia at rate of 102 per minute. Incomplete right bundle branch block. Poor progression of R waves in the anterior chest leads. Left axis deviation. Ultrasound of the abdomen showed cholelithiasis without features of cholecystitis. No gallbladder wall thickening. CAT scan of the abdomen showed suspected cholelithiasis. Evidence of diverticulosis. Serum glucose 174, BUN 32, creatinine 1.07, sodium 138, potassium 4.2. Lipase 106, normal function tests. Troponin 0.03. CBC showed white count 15,000, hemoglobin 10, hematocrit 32, platelet count 246. Urinalysis was unremarkable. ABG showed pH 7.47, pCO2 33, pO2 108.   ASSESSMENT:  1. Pneumonia versus pleural effusion, however, the clinical data suggests pneumonia given the leukocytosis, tachycardia, especially she gave history of vomiting which may result in aspiration.  2. Vomiting. 3. Sinus tachycardia.  4. Leukocytosis.  5. Cholelithiasis without features of cholecystitis. 6. Diverticulosis.  7. Dementia which is mild.  8. Systemic hypertension.  9. Diabetes mellitus type 2 controlled on diet.  10. History of chronic kidney disease.  11. Hyperlipidemia. 12. Glaucoma. 13. Normocytic normochromic anemia.  14. History of breast cancer status post right mastectomy.   PLAN: Will admit the patient. Blood cultures x2 unfortunately were not done, I will order that. Intravenous Levaquin. Obtain echocardiogram to ensure no underlying congestive heart failure given her shortness of breath  and the possibility of pleural effusion. I will check BNP. Check troponin. I just got the results now was, it was normal at 0.03. Continue the rest of home medications. Regarding LIVING WILL and CODE STATUS, I could not have certainty from the patient given her dementia.   TIME SPENT EVALUATING THIS PATIENT: Took more than one hour.   ____________________________ Clovis Pu. Lenore Manner, MD amd:cms D: 11/24/2011 05:21:28 ET T: 11/24/2011 09:18:57 ET JOB#: [id number]  cc: Clovis Pu. Lenore Manner, MD, <Dictator> Marne A. Glori Bickers, MD Clovis Pu Kentrell Guettler MD ELECTRONICALLY SIGNED 12/01/2011 22:41

## 2014-05-09 NOTE — Discharge Summary (Signed)
PATIENT NAME:  Courtney Castillo, Courtney Castillo MR#:  [id number] DATE OF BIRTH:  10-22-1927  DATE OF ADMISSION:  11/16/2012 DATE OF DISCHARGE:  11/20/2012  DISCHARGE DIAGNOSES: 1.  Fall and back pain, L1 compression fracture, status post kyphoplasty on 11/19/2012.  2.  Hypertension.  3.  Diabetes.  4.  Weakness.   CONDITION ON DISCHARGE:  Stable.   CODE STATUS:  FULL CODE.   MEDICATIONS ON DISCHARGE: 1.  Aspirin 81 mg once a day.  2.  Oxybutynin 5 mg 24-hour tablet once a day.  3.  Sertraline 50 mg once a day.  4.  Travatan 0.004% ophthalmic solution each eye 2 times a day.  5.  Donepezil 10 mg oral tablet once a day.  6.  Namenda 5 mg oral tablet 2 times a day.  7.  Acetaminophen and oxycodone 325/5 mg oral tablet every four hours for five days as needed for pain.  8.  Valsartan 80 mg oral tablet once a day.  9.  Docusate sodium 100 mg oral capsule 2 times a day as needed for constipation.   HOME HEALTH ON DISCHARGE:  Yes.  Advised to have physical therapy and nurse on discharge.   DIET:  Low sodium.  Diet consistency regular.    ACTIVITY:  As tolerated.   TIMEFRAME TO FOLLOW-UP:  Within 1 to 2 weeks in ortho clinic with Dr. Hessie Knows.    HISTORY OF PRESENT ILLNESS:  By Dr. Lunette Stands admitted on 31st of October.  An 81 year old female resident of assisted living facility with dementia, hypertension, hyperlipidemia and breast cancer was presented with severe abdomen and back pain for 3 to 4 days, 10 out of 10 and so she was brought to the Emergency Room.  The patient was brought to the Emergency Room two days ago.  CT with abdomen and pelvis was done, was unremarkable.  Lab work was also unremarkable and so she was discharged back to nursing home.  Continued to have severe pain, but no fever.  Nausea, vomiting was not there, received multiple doses of medication for pain in the ER, but did not get relieved.  WBC count was slightly up at 14,000 and so she was admitted for further management.    HOSPITAL COURSE AND STAY:  On MRI she was found have a compression fracture of L1 vertebra and so orthopedic consult was called in and Dr. Rudene Christians suggested to do kyphoplasty which was done on 3rd of November and the patient had significant improvement in her pain.  After that we discharged her with nurse and physical therapy services to be arranged at her facility.   OTHER MEDICAL ISSUES:  1.  Hypertension, which was well-controlled with Valsartan.  2.  Diabetes which was controlled, was not on any medication.  3.  Dementia.  We continued her home medication what she was taking before.  4.  DVT prophylaxis as provided by Lovenox while she was in the hospital.   [place]:  Dr. Hessie Knows for orthopedic.  IMPORTANT LABORATORY RESULTS IN THE HOSPITAL:  On 31st of October on presentation urinalysis was negative.  Creatinine was 1.04.  White cell count was 14.9 and hemoglobin was 11.2.  Blood culture was no growth.  MRI lumbar spine showed compression fracture of L1 without spinal canal stenosis.   Total time spent in this discharge 40 minutes.   ____________________________ Ceasar Lund Anselm Jungling, MD vgv:ea D: 11/24/2012 16:15:33 ET T: 11/25/2012 01:05:15 ET JOB#: [id number]  cc: Ceasar Lund. Anselm Jungling, MD, <Dictator> Legrand Como  Arnell Asal, MD Vaughan Basta MD ELECTRONICALLY SIGNED 12/03/2012 8:12

## 2014-05-09 NOTE — H&P (Signed)
PATIENT NAME:  Courtney Castillo, Courtney Castillo MR#:  [id number] DATE OF BIRTH:  1927/06/23  DATE OF ADMISSION:  11/16/2012  PRIMARY CARE PHYSICIAN: Nonlocal.   REFERRING PHYSICIAN: Dr. Marta Antu.   CHIEF COMPLAINT: Back pain.   HISTORY OF PRESENT ILLNESS: Courtney Castillo is an 81 year old pleasant white female who is a resident of an assisted living facility. Also has history of dementia, hypertension, hyperlipidemia, previous history of breast cancer. Has been experiencing severe abdominal pain, back pain for the last 3 to 4 days. The patient states it is severe in intensity, 10/10. Concerning this, the patient was brought to the Emergency Department 2 days back. Underwent a CT abdomen and pelvis which was completely unremarkable. The patient's lab work was also unremarkable. Concerning this, the patient was discharged back to nursing home. As the patient continues to have severe pain, presented to the primary care physician's office who referred her to the Emergency Department to be admitted until further evaluation in regards to the pain is identified. The patient denies having any fever. Denies having any nausea, vomiting. The patient could not tell if she had any bowel movements. The patient received multiple doses of pain medications and continued to be in severe distress. The patient developed mild respiratory distress after given the Dilaudid. The patient's pain is especially worse with any movement. Denies having any fall. The patient's WBC count slightly went up compared to 2 days back from normal to 14,000. Other lab workup is completely unremarkable.    PAST MEDICAL HISTORY:  1. Hypertension.  2. Hyperlipidemia.  3. Diabetes mellitus, noninsulin dependent.  4. Chronic kidney disease.  5. Hyperlipidemia.  6. Glaucoma.  7. Anemia.  8. Hypertension.  9. History of breast cancer.   PAST SURGICAL HISTORY:  1. Right mastectomy.  2. Hysterectomy.   ALLERGIES: SULFA.   HOME MEDICATIONS:  1. Valsartan  120 mg daily.  2. Travatan ophthalmic drops.  3. Multivitamin once a day.  4. Sertraline 50 mg a day.  5. Oxycodone 5 mg every 6 hours as needed.  6. Oxybutynin 5 mg once a day.  7. Namenda 5 mg 2 times a day.  8. Acetaminophen 2 tablets every 6 hours as needed.  9. Donepezil 10 mg once a day.  10. Aspirin 81 mg once a day.  11. Amlodipine 5 mg once a day.   SOCIAL HISTORY: No history of smoking, drinking alcohol or using illicit drugs. Lives in an assisted living facility. Independent of ADLs.   FAMILY HISTORY: Could not be obtained secondary to the patient's dementia.   REVIEW OF SYSTEMS:  CONSTITUTIONAL: Experiences generalized weakness.  EYES: No change in vision.  ENT: No change in hearing.  RESPIRATORY: Has mild cough. No productive sputum.  CARDIOVASCULAR: No chest pain, palpitations.  GASTROINTESTINAL: No nausea or vomiting. Has abdominal pain in the right lower quadrant.  GENITOURINARY: No dysuria or hematuria.  ENDOCRINE: No polyuria or polydipsia.  HEMATOLOGIC: No easy bruising or bleeding.  SKIN: No rash or lesions.  MUSCULOSKELETAL: Has back pain.  NEUROLOGIC: No  or numbness in any part of the body.   PHYSICAL EXAMINATION:  GENERAL: This is a well-built, well-nourished, age-appropriate female lying down in the bed, not in distress.  VITAL SIGNS: Temperature 100, pulse 83, blood pressure 118/50, respiratory rate of 16, oxygen saturation 95% on room air.  HEENT: Head normocephalic, atraumatic. There is no scleral icterus. Conjunctivae normal. Pupils equal and react to light. Extraocular movements are intact. Mucous membranes moist. No pharyngeal erythema.  NECK: Supple.  No lymphadenopathy. No JVD. No carotid bruit.  CHEST: Has no focal tenderness.  LUNGS: Bilaterally clear to auscultation.  HEART: S1, S2 regular. No murmurs are heard. No pedal edema. Pulses 2+.  ABDOMEN: Bowel sounds present. Soft. The patient's tenderness is very nonspecific. Experiences severe pain  palpating in the suprapubic area and the right lower quadrant. Also, the patient has focal tenderness in the lumbar area.  NEUROLOGIC: The patient is alert, oriented to place and person but not to time. No apparent cranial nerve abnormalities. Motor 5/5 in upper and lower extremities.  SKIN: No rash or lesions.  MUSCULOSKELETAL: Severe pain with any movement of the back.   LABORATORIES: CMP is completely within normal limits. CBC: WBC of 14.9, hemoglobin 11.2, platelet count of 276. UA negative for nitrites and leukocyte esterase.   CT abdomen and pelvis done from 11/13/2012 showed atherosclerotic disease. No hydronephrosis or nephrolithiasis. No focal renal lesion. Urinary bladder appears unremarkable. Colonic diverticulosis without any diverticulitis. Chronic changes in the lung bases.   ASSESSMENT AND PLAN: Courtney Castillo is an 81 year old female who comes to the Emergency Department with right lower quadrant pain and back pain.  1. Back pain and abdominal pain: Seems to be more musculoskeletal in nature. Will obtain MRI of the lumbar spine. CT abdomen and pelvis is unremarkable. Will also obtain lactic acid, CRP, sedimentation rate concerning about the patient's elevated white blood cell count. The patient currently does not have any indication for any antibiotics. Continue with the pain medication with Percocet as well as Toradol.  2. Hypertension: Currently well controlled.  3. Diabetes mellitus: Currently not on any medications.  4. Dementia: Will continue the home medications.  5. Keep the patient on deep vein thrombosis prophylaxis with Lovenox.   TIME SPENT: 40 minutes.   ____________________________ Monica Becton, MD pv:gb D: 11/16/2012 04:38:55 ET T: 11/16/2012 05:43:37 ET JOB#: [id number]  cc: Monica Becton, MD, <Dictator> Monica Becton MD ELECTRONICALLY SIGNED 11/17/2012 3:23

## 2014-05-09 NOTE — Consult Note (Signed)
PATIENT NAME:  Courtney Castillo, PITONES MR#:  [id number] DATE OF BIRTH:  06/03/1927  DATE OF CONSULTATION:  11/18/2012  CONSULTING PHYSICIAN: Laurene Footman, M.D.   REASON FOR CONSULTATION: Back pain, possible compression fracture.   HISTORY OF PRESENT ILLNESS: The patient is an 81 year old who was admitted on 11/16/2012. She was seen at that time and quite confused secondary, apparently,  to pain medication. Subsequently she is doing much better mentally. Yesterday she had an MRI that confirmed an acute L1 compression fracture. She has been unable to ambulate secondary to this compression fracture. She denies any numbness or tingling in the legs. She has a history of diabetes as well as some kidney disease. No history of prior compression fracture or fracture from osteoporosis. She lives in an assisted living facility   REVIEW OF SYSTEMS: Was reviewed along with her prior current medications and past history. Her initial CT showed what appeared to be an L1 fracture, age-indeterminate. Subsequent MRI confirms this to be an acute fracture with significant compression.   RECOMMENDATION: For kyphoplasty. After discussion with the patient, risks, benefits, and possible complications. Bone model and kyphoplasty brochure were reviewed with her. I think this is likely to get her ambulatory and can do this tomorrow. We will hold anticoagulation preoperatively and give perioperative antibiotics. The risks, benefits, and possible complications were discussed with the patient.  ____________________________ Laurene Footman, MD mjm:sg D: 11/18/2012 09:53:56 ET T: 11/18/2012 10:34:12 ET JOB#: [id number]  cc: Laurene Footman, MD, <Dictator>  Laurene Footman MD ELECTRONICALLY SIGNED 11/18/2012 14:36

## 2014-05-09 NOTE — Consult Note (Signed)
Brief Consult Note: Diagnosis: compression fracture in confused patient.   Comments: will continue to follow, hope we can get MRI, awaiting nephew o contact.  Electronic Signatures: Laurene Footman (MD)  (Signed 31-Oct-14 13:09)  Authored: Brief Consult Note   Last Updated: 31-Oct-14 13:09 by Laurene Footman (MD)

## 2014-05-09 NOTE — Op Note (Signed)
PATIENT NAME:  Courtney Castillo, Courtney Castillo MR#:  [id number] DATE OF BIRTH:  1927/02/11  DATE OF PROCEDURE:  11/19/2012  PREOPERATIVE DIAGNOSIS: L1 compression fracture.   POSTOPERATIVE DIAGNOSIS: L1 compression fracture.   PROCEDURE: L1 biopsy and kyphoplasty.   ANESTHESIA: MAC.   SURGEON: Hessie Knows, M.D.   DESCRIPTION OF PROCEDURE: The patient was brought to the operating room, and after adequate anesthesia was obtained, the patient was placed in a prone position and the back was well visualized on both AP and lateral projections with L1 localized. After this had been obtained, the timeout procedure carried out, and the skin was prepped with alcohol and 1% Xylocaine 5 mL infiltrated on both sides.   Next, the back was prepped and draped in the usual sterile fashion. Repeat timeout procedure carried out. A spinal needle was then used to inject a mixture of 1% Xylocaine,  0.5% Sensorcaine with epinephrine into a tract from the pedicle out to the skin, This was carried out on both sides. A small stab incision was made and the trocar introduced to the pedicle and then advanced, checking on both AP and lateral imaging to make sure that neural foramen was not entered.   After the posterior wall of the vertebral body was passed, a biopsy was obtained of the vertebral body. Drilling then carried out. The left side was approached in the same fashion except without the biopsy obtained. The drilling was carried out on the left side. Balloons were inserted and inflated with approximately 3 mL on both sides. Bone cement was then placed on the right. The balloon was let down on the left and approximately 3 mL placed on both sides with at least partial correction of the pre-existing deformity. There were no complications and after the good visualization in AP and lateral projections with final images saved, the trocars were removed. Prior to imaging, the wounds were closed with Dermabond. The patient was placed back in prone  position in stable condition. There were no complications.   SPECIMENS: L1 vertebral body.   ESTIMATED BLOOD LOSS: Minimal.   ____________________________ Laurene Footman, MD mjm:np D: 11/19/2012 18:03:13 ET T: 11/19/2012 19:57:23 ET JOB#: [id number]  cc: Laurene Footman, MD, <Dictator> Laurene Footman MD ELECTRONICALLY SIGNED 11/20/2012 7:21

## 2014-05-10 NOTE — Op Note (Signed)
PATIENT NAME:  Courtney Castillo, Courtney Castillo MR#:  [id number] DATE OF BIRTH:  03-01-1927  DATE OF PROCEDURE:  03/28/2013  PREOPERATIVE DIAGNOSIS:  T12 compression fracture.   POSTOPERATIVE DIAGNOSIS:  T12 compression fracture.   PROCEDURE:  T12 kyphoplasty and vertebral body biopsy.   ANESTHESIA:  MAC.   SURGEON:  Hessie Knows, M.D.   DESCRIPTION OF PROCEDURE:  The patient was brought to the Operating Room and after adequate anesthesia was obtained, the patient was placed prone.  C-arm was brought in and good visualization on both AP and lateral projections was obtained.  After the timeout procedure was completed, the skin was prepped with alcohol and 5 mL of 1% Xylocaine were infiltrated on the left and right sides for placement of subsequent trocars.  The back was then prepped and draped in the usual sterile fashion.  Repeat timeout procedure was carried out.  After repeat timeout procedure spinal needle was used to give local down to the pedicle on the right side.  A small skin incision was made and a trocar advanced in an extrapedicular position.  Biopsy obtained and then drilling.  A balloon was then placed and it crossed the midline so only one sided stick was required.  The balloon was inflated to about 4 mL and cement was used to fill the cavity.  The cavity filled well without extravasation.  There was partial correction of deformity.  After this had been completed, the trocar was removed, permanent C-arm views were obtained.  The wound was covered with Dermabond and a Band-Aid.  The patient was sent to the recovery room in stable condition.   ESTIMATED BLOOD LOSS:  Minimal.   COMPLICATIONS:  None.   SPECIMEN:  T12 vertebral body biopsy.    ____________________________ Laurene Footman, MD mjm:ea D: 03/28/2013 18:49:59 ET T: 03/29/2013 06:16:58 ET JOB#: [id number]  cc: Laurene Footman, MD, <Dictator> Laurene Footman MD ELECTRONICALLY SIGNED 03/29/2013 10:58

## 2014-05-11 NOTE — H&P (Signed)
PATIENT NAME:  Courtney Castillo, Courtney Castillo MR#:  [id number] DATE OF BIRTH:  03-15-1927  DATE OF ADMISSION:  02/28/2011  PRIMARY CARE PHYSICIAN: Dr. Glori Bickers   CHIEF COMPLAINT: Poor p.o. intake.   HISTORY OF PRESENT ILLNESS: This is an 81 year old female who lives at assisted living home. They had been noting she has had decreased p.o. intake last few days. Today her blood pressure was running in the low 17P systolically. She was sent over to the ER for evaluation. She tells me that she has had nausea and vomiting over the last couple of days but is currently not nauseated. I did speak with her health care power of attorney who said she had a similar incident about a month ago, was admitted to [facility] except she didn't have nausea and vomiting. She had been given some fluids here but still looks clinically dehydrated. Blood pressure is a little bit better with a liter of fluids. We are going to go ahead and observe her overnight and give her more hydration. She denies any abdominal pain.   PAST MEDICAL HISTORY:  1. Mild dementia.  2. Glaucoma.  3. Hyperlipidemia.  4. Chronic kidney disease.  5. Non-insulin-dependent diabetes.  6. Anemia.  7. Hypertension.  8. Breast cancer.   SURGICAL HISTORY:  1. Right mastectomy. 2. Hysterectomy.   ALLERGIES: Sulfa.   CURRENT MEDICATIONS:  1. Allegra 60 mg daily p.r.n.  2. Mobic 15 mg daily p.r.n.  3. Tylenol 650 every six hours p.r.n.  4. Protonix 40 mg daily.  5. Klonopin 0.5 mg b.i.d. p.r.n.  6. Zoloft 50 mg at bedtime.  7. Amlodipine 5 mg daily.  8. Aspirin 81 mg daily.  9. Diovan 120 mg daily.  10. VESIcare 5 mg daily.  11. Travatan 0.04% one drop each eye b.i.d.  12. Aricept 10 mg at bedtime.  13. Multivitamin daily.  14. Tramadol 50 mg t.i.d. p.r.n.  15. Meclizine 12.5 mg b.i.d. p.r.n.   SOCIAL HISTORY: Does not smoke. Does not drink alcohol. She lives in an assisted living facility.   FAMILY HISTORY: Significant for diabetes.    REVIEW OF SYSTEMS: CONSTITUTIONAL: No fever or chills. EYES: No blurred vision. ENT: No hearing loss. CARDIOVASCULAR: No chest pain. PULMONARY: No shortness of breath. GASTROINTESTINAL: She has had nausea, vomiting. GENITOURINARY: No dysuria. ENDOCRINE: No heat or cold intolerance. INTEGUMENT: No rash. MUSCULOSKELETAL: Occasional joint pain. NEUROLOGICAL: No numbness or weakness.   PHYSICAL EXAMINATION:  VITAL SIGNS: Temperature 98, pulse 102, respirations 24, blood pressure 110/46.   GENERAL: This is a well-nourished white female who looks clinically dehydrated.   HEENT: The pupils are equal, round, reactive to light. Sclerae is nonicteric. The oral mucosa is dry. Oropharynx is clear. Nasopharynx is clear.   NECK: Supple. No JVD, lymphadenopathy. No thyromegaly.   CARDIOVASCULAR: Regular rate and rhythm. There is a 2/6 systolic murmur.   LUNGS: Clear to auscultation. No dullness to percussion. She is not using accessory muscles.   ABDOMEN: Soft, nontender, nondistended. No hepatosplenomegaly. No masses.   EXTREMITIES: There is no edema.   NEUROLOGIC: Cranial nerves II through XII are intact. She is alert and oriented x4.   SKIN: Moist with no rash.   LABORATORY, DIAGNOSTIC AND RADIOLOGICAL DATA: BUN 32, creatinine 3.57, sodium 132, potassium 4.6, hemoglobin 10.1, white blood cells 7.5.   ASSESSMENT AND PLAN:  1. Dehydration. Likely secondary to nausea and vomiting and poor p.o. intake. Will go ahead and gently hydrate her overnight, make sure she is taking p.o.  2.  Nausea and vomiting. I suspect this is viral. She has no abdominal pain and no other symptoms. Will go ahead and start her on soft diet and see how she responds to this.  3. Hypotension. Blood pressure systolically around 90 coming in. Will go ahead and hydrate her. Hold her blood pressure medicines if necessary. Give her more IV fluids.  4. Chronic kidney disease. I am not sure what her baseline is. We are sending for  records from the recent admission at [facility] see if we can get a better handle on what her baseline is. She appears to have stage III or IV chronic kidney disease.  5. Diabetes. Will continue her current medications.   TIME SPENT ON ADMISSION: 45 minutes.  ____________________________ Baxter Hire, MD jdj:cms D: 02/28/2011 17:27:05 ET T: 02/28/2011 17:49:22 ET JOB#: [id number]  cc: Baxter Hire, MD, <Dictator> Marne A. Glori Bickers, MD  Baxter Hire MD ELECTRONICALLY SIGNED 03/02/2011 12:59

## 2014-05-11 NOTE — Discharge Summary (Signed)
PATIENT NAME:  Courtney Castillo, CHARRIER MR#:  [id number] DATE OF BIRTH:  06/11/1927  DATE OF ADMISSION:  02/28/2011 DATE OF DISCHARGE:  03/02/2011  DISCHARGE DIAGNOSIS: Acute renal failure secondary to poor p.o. intake with nausea, vomiting, and dehydration. The patient's acute renal failure has resolved.   OTHER DIAGNOSES:  1. Mild dementia. 2. Glaucoma. 3. Hyperlipidemia. 4. Diet controlled diabetes. 5. Anemia. 6. Hypertension.   DISCHARGE MEDICATIONS:  1. Allegra 60 mg p.o. daily p.r.n.  2. Morphine 15 mg p.o. daily p.r.n. 3. Tylenol 650 mg every six hours as needed.  4. Protonix 40 mg p.o. daily.  5. Klonopin 0.5 mg p.o. b.i.d. p.r.n.  6. Zoloft 50 mg at bedtime.  7. Amlodipine 5 mg daily. 8. Aspirin 81 mg p.o. daily.  9. Diovan 120 mg p.o. daily.  10. VESIcare 5 mg p.o. daily.  11. Travatan 0.04% one drop in each eye b.i.d.  12. Aricept 10 mg p.o. at bedtime.  13. Multivitamin tablet daily.  14. Tramadol 50 mg p.o. t.i.d. p.r.n. 15. Meclizine 12.5 mg p.o. b.i.d. p.r.n.   ALLERGIES: She is allergic to sulfa.   HOSPITAL COURSE: This is an 81 year old female patient from [facility] who came in because of decreased p.o. intake along with some nausea and vomiting. The patient also had low blood pressure on admission with systolic in the 83'T. She was admitted for acute renal failure due to dehydration on observation status and started on IV fluids with normal sinus at 125 mL/h. The patient did not have any nausea, vomiting, or diarrhea since admission, however, her p.o. intake is somewhat low. Yesterday she had some abdominal pain for which abdominal CAT scan was done which showed no diverticulitis. The patient has normal abdominal CT. The patient's lab data showed initial electrolytes on admission BUN 32, creatinine 3.57. The patient's renal function improved. Today she has BUN of 13, creatinine 0.80. White count on admission was 7.5, hemoglobin 10.7, hematocrit 31.4, platelets 310. Urine  was cloudy, trace bacteria. EKG showed normal sinus with PACs, 92 beats per minute on admission. The patient's renal function is better. Her dehydration improved and did not have any nausea, vomiting, or diarrhea. She was continued on her home medications and no addition of new medications. The patient can follow-up with her primary doctor or nursing home doctor within one week.   CONDITION ON DISCHARGE: Stable.   TIME SPENT ON DISCHARGE PREPARATION: More than 30 minutes.   ____________________________ Epifanio Lesches, MD sk:drc D: 03/02/2011 12:02:22 ET T: 03/02/2011 13:10:38 ET JOB#: [id number]  cc: Epifanio Lesches, MD, <Dictator> Epifanio Lesches MD ELECTRONICALLY SIGNED 03/10/2011 9:14

## 2014-06-23 DIAGNOSIS — Z79899 Other long term (current) drug therapy: Secondary | ICD-10-CM | POA: Diagnosis not present

## 2014-06-24 DIAGNOSIS — Z5181 Encounter for therapeutic drug level monitoring: Secondary | ICD-10-CM | POA: Diagnosis not present

## 2014-07-15 ENCOUNTER — Ambulatory Visit: Payer: Medicare Other | Admitting: Family Medicine

## 2014-07-15 ENCOUNTER — Telehealth: Payer: Self-pay | Admitting: Family Medicine

## 2014-07-15 NOTE — Telephone Encounter (Signed)
Patient's nephew called and cancelled patient's appointment for tomorrow.  Patient is at [facility] and he said she'll be seeing the doctor there.

## 2014-07-15 NOTE — Telephone Encounter (Signed)
Thanks for letting me know!

## 2014-07-16 ENCOUNTER — Ambulatory Visit: Payer: Medicare Other | Admitting: Family Medicine

## 2014-07-20 DIAGNOSIS — F0390 Unspecified dementia without behavioral disturbance: Secondary | ICD-10-CM | POA: Diagnosis not present

## 2014-08-27 DIAGNOSIS — M79671 Pain in right foot: Secondary | ICD-10-CM | POA: Diagnosis not present

## 2014-08-27 DIAGNOSIS — B351 Tinea unguium: Secondary | ICD-10-CM | POA: Diagnosis not present

## 2014-08-27 DIAGNOSIS — M79672 Pain in left foot: Secondary | ICD-10-CM | POA: Diagnosis not present

## 2014-08-27 DIAGNOSIS — R262 Difficulty in walking, not elsewhere classified: Secondary | ICD-10-CM | POA: Diagnosis not present

## 2014-09-13 DIAGNOSIS — M79621 Pain in right upper arm: Secondary | ICD-10-CM | POA: Diagnosis not present

## 2014-09-13 DIAGNOSIS — R079 Chest pain, unspecified: Secondary | ICD-10-CM | POA: Diagnosis not present

## 2014-09-13 DIAGNOSIS — W19XXXA Unspecified fall, initial encounter: Secondary | ICD-10-CM | POA: Diagnosis not present

## 2014-09-13 DIAGNOSIS — M25511 Pain in right shoulder: Secondary | ICD-10-CM | POA: Diagnosis not present

## 2014-10-27 DIAGNOSIS — E785 Hyperlipidemia, unspecified: Secondary | ICD-10-CM | POA: Diagnosis not present

## 2014-11-06 DIAGNOSIS — Z23 Encounter for immunization: Secondary | ICD-10-CM | POA: Diagnosis not present

## 2015-01-03 DIAGNOSIS — F0390 Unspecified dementia without behavioral disturbance: Secondary | ICD-10-CM | POA: Diagnosis not present

## 2015-02-07 DIAGNOSIS — F0390 Unspecified dementia without behavioral disturbance: Secondary | ICD-10-CM | POA: Diagnosis not present

## 2015-02-25 DIAGNOSIS — M79672 Pain in left foot: Secondary | ICD-10-CM | POA: Diagnosis not present

## 2015-02-25 DIAGNOSIS — R262 Difficulty in walking, not elsewhere classified: Secondary | ICD-10-CM | POA: Diagnosis not present

## 2015-02-25 DIAGNOSIS — B351 Tinea unguium: Secondary | ICD-10-CM | POA: Diagnosis not present

## 2015-02-25 DIAGNOSIS — M79671 Pain in right foot: Secondary | ICD-10-CM | POA: Diagnosis not present

## 2015-03-27 DIAGNOSIS — F0390 Unspecified dementia without behavioral disturbance: Secondary | ICD-10-CM | POA: Diagnosis not present

## 2015-05-07 DIAGNOSIS — Z5181 Encounter for therapeutic drug level monitoring: Secondary | ICD-10-CM | POA: Diagnosis not present

## 2015-05-08 DIAGNOSIS — R627 Adult failure to thrive: Secondary | ICD-10-CM | POA: Diagnosis not present

## 2015-05-08 DIAGNOSIS — Z741 Need for assistance with personal care: Secondary | ICD-10-CM | POA: Diagnosis not present

## 2015-05-08 DIAGNOSIS — F0390 Unspecified dementia without behavioral disturbance: Secondary | ICD-10-CM | POA: Diagnosis not present

## 2015-05-08 DIAGNOSIS — M6281 Muscle weakness (generalized): Secondary | ICD-10-CM | POA: Diagnosis not present

## 2015-05-08 DIAGNOSIS — F339 Major depressive disorder, recurrent, unspecified: Secondary | ICD-10-CM | POA: Diagnosis not present

## 2015-05-08 DIAGNOSIS — R1312 Dysphagia, oropharyngeal phase: Secondary | ICD-10-CM | POA: Diagnosis not present

## 2015-05-11 DIAGNOSIS — R1312 Dysphagia, oropharyngeal phase: Secondary | ICD-10-CM | POA: Diagnosis not present

## 2015-05-11 DIAGNOSIS — R627 Adult failure to thrive: Secondary | ICD-10-CM | POA: Diagnosis not present

## 2015-05-11 DIAGNOSIS — F339 Major depressive disorder, recurrent, unspecified: Secondary | ICD-10-CM | POA: Diagnosis not present

## 2015-05-11 DIAGNOSIS — M6281 Muscle weakness (generalized): Secondary | ICD-10-CM | POA: Diagnosis not present

## 2015-05-11 DIAGNOSIS — F0390 Unspecified dementia without behavioral disturbance: Secondary | ICD-10-CM | POA: Diagnosis not present

## 2015-05-11 DIAGNOSIS — Z741 Need for assistance with personal care: Secondary | ICD-10-CM | POA: Diagnosis not present

## 2015-05-12 DIAGNOSIS — R1312 Dysphagia, oropharyngeal phase: Secondary | ICD-10-CM | POA: Diagnosis not present

## 2015-05-12 DIAGNOSIS — M6281 Muscle weakness (generalized): Secondary | ICD-10-CM | POA: Diagnosis not present

## 2015-05-12 DIAGNOSIS — F0390 Unspecified dementia without behavioral disturbance: Secondary | ICD-10-CM | POA: Diagnosis not present

## 2015-05-12 DIAGNOSIS — F339 Major depressive disorder, recurrent, unspecified: Secondary | ICD-10-CM | POA: Diagnosis not present

## 2015-05-12 DIAGNOSIS — Z741 Need for assistance with personal care: Secondary | ICD-10-CM | POA: Diagnosis not present

## 2015-05-12 DIAGNOSIS — R627 Adult failure to thrive: Secondary | ICD-10-CM | POA: Diagnosis not present

## 2015-05-13 DIAGNOSIS — M6281 Muscle weakness (generalized): Secondary | ICD-10-CM | POA: Diagnosis not present

## 2015-05-13 DIAGNOSIS — R627 Adult failure to thrive: Secondary | ICD-10-CM | POA: Diagnosis not present

## 2015-05-13 DIAGNOSIS — F0390 Unspecified dementia without behavioral disturbance: Secondary | ICD-10-CM | POA: Diagnosis not present

## 2015-05-13 DIAGNOSIS — F339 Major depressive disorder, recurrent, unspecified: Secondary | ICD-10-CM | POA: Diagnosis not present

## 2015-05-13 DIAGNOSIS — R1312 Dysphagia, oropharyngeal phase: Secondary | ICD-10-CM | POA: Diagnosis not present

## 2015-05-13 DIAGNOSIS — Z741 Need for assistance with personal care: Secondary | ICD-10-CM | POA: Diagnosis not present

## 2015-05-14 DIAGNOSIS — F0390 Unspecified dementia without behavioral disturbance: Secondary | ICD-10-CM | POA: Diagnosis not present

## 2015-05-14 DIAGNOSIS — M6281 Muscle weakness (generalized): Secondary | ICD-10-CM | POA: Diagnosis not present

## 2015-05-14 DIAGNOSIS — R627 Adult failure to thrive: Secondary | ICD-10-CM | POA: Diagnosis not present

## 2015-05-14 DIAGNOSIS — R1312 Dysphagia, oropharyngeal phase: Secondary | ICD-10-CM | POA: Diagnosis not present

## 2015-05-14 DIAGNOSIS — F339 Major depressive disorder, recurrent, unspecified: Secondary | ICD-10-CM | POA: Diagnosis not present

## 2015-05-14 DIAGNOSIS — Z741 Need for assistance with personal care: Secondary | ICD-10-CM | POA: Diagnosis not present

## 2015-05-15 DIAGNOSIS — R1312 Dysphagia, oropharyngeal phase: Secondary | ICD-10-CM | POA: Diagnosis not present

## 2015-05-15 DIAGNOSIS — Z741 Need for assistance with personal care: Secondary | ICD-10-CM | POA: Diagnosis not present

## 2015-05-15 DIAGNOSIS — F339 Major depressive disorder, recurrent, unspecified: Secondary | ICD-10-CM | POA: Diagnosis not present

## 2015-05-15 DIAGNOSIS — M6281 Muscle weakness (generalized): Secondary | ICD-10-CM | POA: Diagnosis not present

## 2015-05-15 DIAGNOSIS — F0390 Unspecified dementia without behavioral disturbance: Secondary | ICD-10-CM | POA: Diagnosis not present

## 2015-05-15 DIAGNOSIS — R627 Adult failure to thrive: Secondary | ICD-10-CM | POA: Diagnosis not present

## 2015-05-18 DIAGNOSIS — M6281 Muscle weakness (generalized): Secondary | ICD-10-CM | POA: Diagnosis not present

## 2015-05-18 DIAGNOSIS — F339 Major depressive disorder, recurrent, unspecified: Secondary | ICD-10-CM | POA: Diagnosis not present

## 2015-05-18 DIAGNOSIS — R627 Adult failure to thrive: Secondary | ICD-10-CM | POA: Diagnosis not present

## 2015-05-18 DIAGNOSIS — Z741 Need for assistance with personal care: Secondary | ICD-10-CM | POA: Diagnosis not present

## 2015-05-18 DIAGNOSIS — F0390 Unspecified dementia without behavioral disturbance: Secondary | ICD-10-CM | POA: Diagnosis not present

## 2015-05-18 DIAGNOSIS — R1312 Dysphagia, oropharyngeal phase: Secondary | ICD-10-CM | POA: Diagnosis not present

## 2015-05-19 DIAGNOSIS — F0390 Unspecified dementia without behavioral disturbance: Secondary | ICD-10-CM | POA: Diagnosis not present

## 2015-05-19 DIAGNOSIS — R627 Adult failure to thrive: Secondary | ICD-10-CM | POA: Diagnosis not present

## 2015-05-19 DIAGNOSIS — M6281 Muscle weakness (generalized): Secondary | ICD-10-CM | POA: Diagnosis not present

## 2015-05-19 DIAGNOSIS — R1312 Dysphagia, oropharyngeal phase: Secondary | ICD-10-CM | POA: Diagnosis not present

## 2015-05-19 DIAGNOSIS — F339 Major depressive disorder, recurrent, unspecified: Secondary | ICD-10-CM | POA: Diagnosis not present

## 2015-05-19 DIAGNOSIS — Z741 Need for assistance with personal care: Secondary | ICD-10-CM | POA: Diagnosis not present

## 2015-05-20 DIAGNOSIS — F339 Major depressive disorder, recurrent, unspecified: Secondary | ICD-10-CM | POA: Diagnosis not present

## 2015-05-20 DIAGNOSIS — F0390 Unspecified dementia without behavioral disturbance: Secondary | ICD-10-CM | POA: Diagnosis not present

## 2015-05-20 DIAGNOSIS — M6281 Muscle weakness (generalized): Secondary | ICD-10-CM | POA: Diagnosis not present

## 2015-05-20 DIAGNOSIS — R627 Adult failure to thrive: Secondary | ICD-10-CM | POA: Diagnosis not present

## 2015-05-20 DIAGNOSIS — R1312 Dysphagia, oropharyngeal phase: Secondary | ICD-10-CM | POA: Diagnosis not present

## 2015-05-20 DIAGNOSIS — Z741 Need for assistance with personal care: Secondary | ICD-10-CM | POA: Diagnosis not present

## 2015-05-21 DIAGNOSIS — R1312 Dysphagia, oropharyngeal phase: Secondary | ICD-10-CM | POA: Diagnosis not present

## 2015-05-21 DIAGNOSIS — M6281 Muscle weakness (generalized): Secondary | ICD-10-CM | POA: Diagnosis not present

## 2015-05-21 DIAGNOSIS — F0390 Unspecified dementia without behavioral disturbance: Secondary | ICD-10-CM | POA: Diagnosis not present

## 2015-05-21 DIAGNOSIS — R627 Adult failure to thrive: Secondary | ICD-10-CM | POA: Diagnosis not present

## 2015-05-21 DIAGNOSIS — F339 Major depressive disorder, recurrent, unspecified: Secondary | ICD-10-CM | POA: Diagnosis not present

## 2015-05-21 DIAGNOSIS — Z741 Need for assistance with personal care: Secondary | ICD-10-CM | POA: Diagnosis not present

## 2015-05-23 DIAGNOSIS — F339 Major depressive disorder, recurrent, unspecified: Secondary | ICD-10-CM | POA: Diagnosis not present

## 2015-05-23 DIAGNOSIS — Z741 Need for assistance with personal care: Secondary | ICD-10-CM | POA: Diagnosis not present

## 2015-05-23 DIAGNOSIS — R1312 Dysphagia, oropharyngeal phase: Secondary | ICD-10-CM | POA: Diagnosis not present

## 2015-05-23 DIAGNOSIS — F0390 Unspecified dementia without behavioral disturbance: Secondary | ICD-10-CM | POA: Diagnosis not present

## 2015-05-23 DIAGNOSIS — R627 Adult failure to thrive: Secondary | ICD-10-CM | POA: Diagnosis not present

## 2015-05-23 DIAGNOSIS — M6281 Muscle weakness (generalized): Secondary | ICD-10-CM | POA: Diagnosis not present

## 2015-05-25 DIAGNOSIS — R1312 Dysphagia, oropharyngeal phase: Secondary | ICD-10-CM | POA: Diagnosis not present

## 2015-05-25 DIAGNOSIS — F0390 Unspecified dementia without behavioral disturbance: Secondary | ICD-10-CM | POA: Diagnosis not present

## 2015-05-25 DIAGNOSIS — Z741 Need for assistance with personal care: Secondary | ICD-10-CM | POA: Diagnosis not present

## 2015-05-25 DIAGNOSIS — M6281 Muscle weakness (generalized): Secondary | ICD-10-CM | POA: Diagnosis not present

## 2015-05-25 DIAGNOSIS — R627 Adult failure to thrive: Secondary | ICD-10-CM | POA: Diagnosis not present

## 2015-05-25 DIAGNOSIS — F339 Major depressive disorder, recurrent, unspecified: Secondary | ICD-10-CM | POA: Diagnosis not present

## 2015-05-26 DIAGNOSIS — F0390 Unspecified dementia without behavioral disturbance: Secondary | ICD-10-CM | POA: Diagnosis not present

## 2015-05-26 DIAGNOSIS — M6281 Muscle weakness (generalized): Secondary | ICD-10-CM | POA: Diagnosis not present

## 2015-05-26 DIAGNOSIS — R627 Adult failure to thrive: Secondary | ICD-10-CM | POA: Diagnosis not present

## 2015-05-26 DIAGNOSIS — R1312 Dysphagia, oropharyngeal phase: Secondary | ICD-10-CM | POA: Diagnosis not present

## 2015-05-26 DIAGNOSIS — Z741 Need for assistance with personal care: Secondary | ICD-10-CM | POA: Diagnosis not present

## 2015-05-26 DIAGNOSIS — F339 Major depressive disorder, recurrent, unspecified: Secondary | ICD-10-CM | POA: Diagnosis not present

## 2015-05-27 DIAGNOSIS — R627 Adult failure to thrive: Secondary | ICD-10-CM | POA: Diagnosis not present

## 2015-05-27 DIAGNOSIS — Z741 Need for assistance with personal care: Secondary | ICD-10-CM | POA: Diagnosis not present

## 2015-05-27 DIAGNOSIS — F339 Major depressive disorder, recurrent, unspecified: Secondary | ICD-10-CM | POA: Diagnosis not present

## 2015-05-27 DIAGNOSIS — F0390 Unspecified dementia without behavioral disturbance: Secondary | ICD-10-CM | POA: Diagnosis not present

## 2015-05-27 DIAGNOSIS — M6281 Muscle weakness (generalized): Secondary | ICD-10-CM | POA: Diagnosis not present

## 2015-05-27 DIAGNOSIS — R1312 Dysphagia, oropharyngeal phase: Secondary | ICD-10-CM | POA: Diagnosis not present

## 2015-05-28 DIAGNOSIS — F0390 Unspecified dementia without behavioral disturbance: Secondary | ICD-10-CM | POA: Diagnosis not present

## 2015-05-28 DIAGNOSIS — Z741 Need for assistance with personal care: Secondary | ICD-10-CM | POA: Diagnosis not present

## 2015-05-28 DIAGNOSIS — M6281 Muscle weakness (generalized): Secondary | ICD-10-CM | POA: Diagnosis not present

## 2015-05-28 DIAGNOSIS — R1312 Dysphagia, oropharyngeal phase: Secondary | ICD-10-CM | POA: Diagnosis not present

## 2015-05-28 DIAGNOSIS — R627 Adult failure to thrive: Secondary | ICD-10-CM | POA: Diagnosis not present

## 2015-05-28 DIAGNOSIS — F339 Major depressive disorder, recurrent, unspecified: Secondary | ICD-10-CM | POA: Diagnosis not present

## 2015-05-29 DIAGNOSIS — Z741 Need for assistance with personal care: Secondary | ICD-10-CM | POA: Diagnosis not present

## 2015-05-29 DIAGNOSIS — F339 Major depressive disorder, recurrent, unspecified: Secondary | ICD-10-CM | POA: Diagnosis not present

## 2015-05-29 DIAGNOSIS — R1312 Dysphagia, oropharyngeal phase: Secondary | ICD-10-CM | POA: Diagnosis not present

## 2015-05-29 DIAGNOSIS — R627 Adult failure to thrive: Secondary | ICD-10-CM | POA: Diagnosis not present

## 2015-05-29 DIAGNOSIS — M6281 Muscle weakness (generalized): Secondary | ICD-10-CM | POA: Diagnosis not present

## 2015-05-29 DIAGNOSIS — M25511 Pain in right shoulder: Secondary | ICD-10-CM | POA: Diagnosis not present

## 2015-05-29 DIAGNOSIS — F0390 Unspecified dementia without behavioral disturbance: Secondary | ICD-10-CM | POA: Diagnosis not present

## 2015-06-01 DIAGNOSIS — F0390 Unspecified dementia without behavioral disturbance: Secondary | ICD-10-CM | POA: Diagnosis not present

## 2015-06-01 DIAGNOSIS — M6281 Muscle weakness (generalized): Secondary | ICD-10-CM | POA: Diagnosis not present

## 2015-06-01 DIAGNOSIS — R1312 Dysphagia, oropharyngeal phase: Secondary | ICD-10-CM | POA: Diagnosis not present

## 2015-06-01 DIAGNOSIS — F339 Major depressive disorder, recurrent, unspecified: Secondary | ICD-10-CM | POA: Diagnosis not present

## 2015-06-01 DIAGNOSIS — R627 Adult failure to thrive: Secondary | ICD-10-CM | POA: Diagnosis not present

## 2015-06-01 DIAGNOSIS — Z741 Need for assistance with personal care: Secondary | ICD-10-CM | POA: Diagnosis not present

## 2015-06-02 DIAGNOSIS — R1312 Dysphagia, oropharyngeal phase: Secondary | ICD-10-CM | POA: Diagnosis not present

## 2015-06-02 DIAGNOSIS — R627 Adult failure to thrive: Secondary | ICD-10-CM | POA: Diagnosis not present

## 2015-06-02 DIAGNOSIS — Z741 Need for assistance with personal care: Secondary | ICD-10-CM | POA: Diagnosis not present

## 2015-06-02 DIAGNOSIS — F339 Major depressive disorder, recurrent, unspecified: Secondary | ICD-10-CM | POA: Diagnosis not present

## 2015-06-02 DIAGNOSIS — F0390 Unspecified dementia without behavioral disturbance: Secondary | ICD-10-CM | POA: Diagnosis not present

## 2015-06-02 DIAGNOSIS — M6281 Muscle weakness (generalized): Secondary | ICD-10-CM | POA: Diagnosis not present

## 2015-06-03 DIAGNOSIS — F339 Major depressive disorder, recurrent, unspecified: Secondary | ICD-10-CM | POA: Diagnosis not present

## 2015-06-03 DIAGNOSIS — R627 Adult failure to thrive: Secondary | ICD-10-CM | POA: Diagnosis not present

## 2015-06-03 DIAGNOSIS — F0390 Unspecified dementia without behavioral disturbance: Secondary | ICD-10-CM | POA: Diagnosis not present

## 2015-06-03 DIAGNOSIS — Z741 Need for assistance with personal care: Secondary | ICD-10-CM | POA: Diagnosis not present

## 2015-06-03 DIAGNOSIS — R1312 Dysphagia, oropharyngeal phase: Secondary | ICD-10-CM | POA: Diagnosis not present

## 2015-06-03 DIAGNOSIS — M6281 Muscle weakness (generalized): Secondary | ICD-10-CM | POA: Diagnosis not present

## 2015-06-04 DIAGNOSIS — Z741 Need for assistance with personal care: Secondary | ICD-10-CM | POA: Diagnosis not present

## 2015-06-04 DIAGNOSIS — R627 Adult failure to thrive: Secondary | ICD-10-CM | POA: Diagnosis not present

## 2015-06-04 DIAGNOSIS — F0390 Unspecified dementia without behavioral disturbance: Secondary | ICD-10-CM | POA: Diagnosis not present

## 2015-06-04 DIAGNOSIS — M6281 Muscle weakness (generalized): Secondary | ICD-10-CM | POA: Diagnosis not present

## 2015-06-04 DIAGNOSIS — F339 Major depressive disorder, recurrent, unspecified: Secondary | ICD-10-CM | POA: Diagnosis not present

## 2015-06-04 DIAGNOSIS — R1312 Dysphagia, oropharyngeal phase: Secondary | ICD-10-CM | POA: Diagnosis not present

## 2015-06-05 DIAGNOSIS — M6281 Muscle weakness (generalized): Secondary | ICD-10-CM | POA: Diagnosis not present

## 2015-06-05 DIAGNOSIS — R1312 Dysphagia, oropharyngeal phase: Secondary | ICD-10-CM | POA: Diagnosis not present

## 2015-06-05 DIAGNOSIS — Z741 Need for assistance with personal care: Secondary | ICD-10-CM | POA: Diagnosis not present

## 2015-06-05 DIAGNOSIS — F339 Major depressive disorder, recurrent, unspecified: Secondary | ICD-10-CM | POA: Diagnosis not present

## 2015-06-05 DIAGNOSIS — R627 Adult failure to thrive: Secondary | ICD-10-CM | POA: Diagnosis not present

## 2015-06-05 DIAGNOSIS — F0390 Unspecified dementia without behavioral disturbance: Secondary | ICD-10-CM | POA: Diagnosis not present

## 2015-06-08 DIAGNOSIS — R1312 Dysphagia, oropharyngeal phase: Secondary | ICD-10-CM | POA: Diagnosis not present

## 2015-06-08 DIAGNOSIS — F0390 Unspecified dementia without behavioral disturbance: Secondary | ICD-10-CM | POA: Diagnosis not present

## 2015-06-08 DIAGNOSIS — M6281 Muscle weakness (generalized): Secondary | ICD-10-CM | POA: Diagnosis not present

## 2015-06-08 DIAGNOSIS — F339 Major depressive disorder, recurrent, unspecified: Secondary | ICD-10-CM | POA: Diagnosis not present

## 2015-06-08 DIAGNOSIS — Z741 Need for assistance with personal care: Secondary | ICD-10-CM | POA: Diagnosis not present

## 2015-06-08 DIAGNOSIS — R627 Adult failure to thrive: Secondary | ICD-10-CM | POA: Diagnosis not present

## 2015-06-09 DIAGNOSIS — M6281 Muscle weakness (generalized): Secondary | ICD-10-CM | POA: Diagnosis not present

## 2015-06-09 DIAGNOSIS — Z741 Need for assistance with personal care: Secondary | ICD-10-CM | POA: Diagnosis not present

## 2015-06-09 DIAGNOSIS — F339 Major depressive disorder, recurrent, unspecified: Secondary | ICD-10-CM | POA: Diagnosis not present

## 2015-06-09 DIAGNOSIS — R627 Adult failure to thrive: Secondary | ICD-10-CM | POA: Diagnosis not present

## 2015-06-09 DIAGNOSIS — R1312 Dysphagia, oropharyngeal phase: Secondary | ICD-10-CM | POA: Diagnosis not present

## 2015-06-09 DIAGNOSIS — F0390 Unspecified dementia without behavioral disturbance: Secondary | ICD-10-CM | POA: Diagnosis not present

## 2015-06-10 DIAGNOSIS — Z741 Need for assistance with personal care: Secondary | ICD-10-CM | POA: Diagnosis not present

## 2015-06-10 DIAGNOSIS — R627 Adult failure to thrive: Secondary | ICD-10-CM | POA: Diagnosis not present

## 2015-06-10 DIAGNOSIS — F0390 Unspecified dementia without behavioral disturbance: Secondary | ICD-10-CM | POA: Diagnosis not present

## 2015-06-10 DIAGNOSIS — M6281 Muscle weakness (generalized): Secondary | ICD-10-CM | POA: Diagnosis not present

## 2015-06-10 DIAGNOSIS — F339 Major depressive disorder, recurrent, unspecified: Secondary | ICD-10-CM | POA: Diagnosis not present

## 2015-06-10 DIAGNOSIS — R1312 Dysphagia, oropharyngeal phase: Secondary | ICD-10-CM | POA: Diagnosis not present

## 2015-06-11 DIAGNOSIS — R627 Adult failure to thrive: Secondary | ICD-10-CM | POA: Diagnosis not present

## 2015-06-11 DIAGNOSIS — M6281 Muscle weakness (generalized): Secondary | ICD-10-CM | POA: Diagnosis not present

## 2015-06-11 DIAGNOSIS — F0390 Unspecified dementia without behavioral disturbance: Secondary | ICD-10-CM | POA: Diagnosis not present

## 2015-06-11 DIAGNOSIS — F339 Major depressive disorder, recurrent, unspecified: Secondary | ICD-10-CM | POA: Diagnosis not present

## 2015-06-11 DIAGNOSIS — R1312 Dysphagia, oropharyngeal phase: Secondary | ICD-10-CM | POA: Diagnosis not present

## 2015-06-11 DIAGNOSIS — Z741 Need for assistance with personal care: Secondary | ICD-10-CM | POA: Diagnosis not present

## 2015-06-12 DIAGNOSIS — F339 Major depressive disorder, recurrent, unspecified: Secondary | ICD-10-CM | POA: Diagnosis not present

## 2015-06-12 DIAGNOSIS — R1312 Dysphagia, oropharyngeal phase: Secondary | ICD-10-CM | POA: Diagnosis not present

## 2015-06-12 DIAGNOSIS — R627 Adult failure to thrive: Secondary | ICD-10-CM | POA: Diagnosis not present

## 2015-06-12 DIAGNOSIS — M6281 Muscle weakness (generalized): Secondary | ICD-10-CM | POA: Diagnosis not present

## 2015-06-12 DIAGNOSIS — F0390 Unspecified dementia without behavioral disturbance: Secondary | ICD-10-CM | POA: Diagnosis not present

## 2015-06-12 DIAGNOSIS — Z741 Need for assistance with personal care: Secondary | ICD-10-CM | POA: Diagnosis not present

## 2015-06-15 DIAGNOSIS — R1312 Dysphagia, oropharyngeal phase: Secondary | ICD-10-CM | POA: Diagnosis not present

## 2015-06-15 DIAGNOSIS — M6281 Muscle weakness (generalized): Secondary | ICD-10-CM | POA: Diagnosis not present

## 2015-06-15 DIAGNOSIS — F339 Major depressive disorder, recurrent, unspecified: Secondary | ICD-10-CM | POA: Diagnosis not present

## 2015-06-15 DIAGNOSIS — R627 Adult failure to thrive: Secondary | ICD-10-CM | POA: Diagnosis not present

## 2015-06-15 DIAGNOSIS — Z741 Need for assistance with personal care: Secondary | ICD-10-CM | POA: Diagnosis not present

## 2015-06-15 DIAGNOSIS — F0390 Unspecified dementia without behavioral disturbance: Secondary | ICD-10-CM | POA: Diagnosis not present

## 2015-06-16 DIAGNOSIS — F339 Major depressive disorder, recurrent, unspecified: Secondary | ICD-10-CM | POA: Diagnosis not present

## 2015-06-16 DIAGNOSIS — M6281 Muscle weakness (generalized): Secondary | ICD-10-CM | POA: Diagnosis not present

## 2015-06-16 DIAGNOSIS — Z741 Need for assistance with personal care: Secondary | ICD-10-CM | POA: Diagnosis not present

## 2015-06-16 DIAGNOSIS — R627 Adult failure to thrive: Secondary | ICD-10-CM | POA: Diagnosis not present

## 2015-06-16 DIAGNOSIS — F0390 Unspecified dementia without behavioral disturbance: Secondary | ICD-10-CM | POA: Diagnosis not present

## 2015-06-16 DIAGNOSIS — R1312 Dysphagia, oropharyngeal phase: Secondary | ICD-10-CM | POA: Diagnosis not present

## 2015-06-17 DIAGNOSIS — R627 Adult failure to thrive: Secondary | ICD-10-CM | POA: Diagnosis not present

## 2015-06-17 DIAGNOSIS — Z741 Need for assistance with personal care: Secondary | ICD-10-CM | POA: Diagnosis not present

## 2015-06-17 DIAGNOSIS — M6281 Muscle weakness (generalized): Secondary | ICD-10-CM | POA: Diagnosis not present

## 2015-06-17 DIAGNOSIS — R1312 Dysphagia, oropharyngeal phase: Secondary | ICD-10-CM | POA: Diagnosis not present

## 2015-06-17 DIAGNOSIS — F0390 Unspecified dementia without behavioral disturbance: Secondary | ICD-10-CM | POA: Diagnosis not present

## 2015-06-17 DIAGNOSIS — F339 Major depressive disorder, recurrent, unspecified: Secondary | ICD-10-CM | POA: Diagnosis not present

## 2015-06-18 DIAGNOSIS — F339 Major depressive disorder, recurrent, unspecified: Secondary | ICD-10-CM | POA: Diagnosis not present

## 2015-06-18 DIAGNOSIS — F0390 Unspecified dementia without behavioral disturbance: Secondary | ICD-10-CM | POA: Diagnosis not present

## 2015-06-18 DIAGNOSIS — Z741 Need for assistance with personal care: Secondary | ICD-10-CM | POA: Diagnosis not present

## 2015-06-18 DIAGNOSIS — R1312 Dysphagia, oropharyngeal phase: Secondary | ICD-10-CM | POA: Diagnosis not present

## 2015-06-18 DIAGNOSIS — M6281 Muscle weakness (generalized): Secondary | ICD-10-CM | POA: Diagnosis not present

## 2015-06-18 DIAGNOSIS — R627 Adult failure to thrive: Secondary | ICD-10-CM | POA: Diagnosis not present

## 2015-06-19 DIAGNOSIS — R627 Adult failure to thrive: Secondary | ICD-10-CM | POA: Diagnosis not present

## 2015-06-19 DIAGNOSIS — M6281 Muscle weakness (generalized): Secondary | ICD-10-CM | POA: Diagnosis not present

## 2015-06-19 DIAGNOSIS — R1312 Dysphagia, oropharyngeal phase: Secondary | ICD-10-CM | POA: Diagnosis not present

## 2015-06-19 DIAGNOSIS — F0390 Unspecified dementia without behavioral disturbance: Secondary | ICD-10-CM | POA: Diagnosis not present

## 2015-06-19 DIAGNOSIS — Z741 Need for assistance with personal care: Secondary | ICD-10-CM | POA: Diagnosis not present

## 2015-06-19 DIAGNOSIS — F339 Major depressive disorder, recurrent, unspecified: Secondary | ICD-10-CM | POA: Diagnosis not present

## 2015-06-22 DIAGNOSIS — F339 Major depressive disorder, recurrent, unspecified: Secondary | ICD-10-CM | POA: Diagnosis not present

## 2015-06-22 DIAGNOSIS — R1312 Dysphagia, oropharyngeal phase: Secondary | ICD-10-CM | POA: Diagnosis not present

## 2015-06-22 DIAGNOSIS — M6281 Muscle weakness (generalized): Secondary | ICD-10-CM | POA: Diagnosis not present

## 2015-06-22 DIAGNOSIS — Z741 Need for assistance with personal care: Secondary | ICD-10-CM | POA: Diagnosis not present

## 2015-06-22 DIAGNOSIS — R627 Adult failure to thrive: Secondary | ICD-10-CM | POA: Diagnosis not present

## 2015-06-22 DIAGNOSIS — F0390 Unspecified dementia without behavioral disturbance: Secondary | ICD-10-CM | POA: Diagnosis not present

## 2015-06-24 DIAGNOSIS — R627 Adult failure to thrive: Secondary | ICD-10-CM | POA: Diagnosis not present

## 2015-06-24 DIAGNOSIS — F0390 Unspecified dementia without behavioral disturbance: Secondary | ICD-10-CM | POA: Diagnosis not present

## 2015-06-24 DIAGNOSIS — R1312 Dysphagia, oropharyngeal phase: Secondary | ICD-10-CM | POA: Diagnosis not present

## 2015-06-24 DIAGNOSIS — Z741 Need for assistance with personal care: Secondary | ICD-10-CM | POA: Diagnosis not present

## 2015-06-24 DIAGNOSIS — F339 Major depressive disorder, recurrent, unspecified: Secondary | ICD-10-CM | POA: Diagnosis not present

## 2015-06-24 DIAGNOSIS — M6281 Muscle weakness (generalized): Secondary | ICD-10-CM | POA: Diagnosis not present

## 2015-06-25 DIAGNOSIS — F0390 Unspecified dementia without behavioral disturbance: Secondary | ICD-10-CM | POA: Diagnosis not present

## 2015-06-25 DIAGNOSIS — R627 Adult failure to thrive: Secondary | ICD-10-CM | POA: Diagnosis not present

## 2015-06-25 DIAGNOSIS — Z741 Need for assistance with personal care: Secondary | ICD-10-CM | POA: Diagnosis not present

## 2015-06-25 DIAGNOSIS — F339 Major depressive disorder, recurrent, unspecified: Secondary | ICD-10-CM | POA: Diagnosis not present

## 2015-06-25 DIAGNOSIS — M6281 Muscle weakness (generalized): Secondary | ICD-10-CM | POA: Diagnosis not present

## 2015-06-25 DIAGNOSIS — R1312 Dysphagia, oropharyngeal phase: Secondary | ICD-10-CM | POA: Diagnosis not present

## 2015-06-26 DIAGNOSIS — Z741 Need for assistance with personal care: Secondary | ICD-10-CM | POA: Diagnosis not present

## 2015-06-26 DIAGNOSIS — M6281 Muscle weakness (generalized): Secondary | ICD-10-CM | POA: Diagnosis not present

## 2015-06-26 DIAGNOSIS — R627 Adult failure to thrive: Secondary | ICD-10-CM | POA: Diagnosis not present

## 2015-06-26 DIAGNOSIS — R1312 Dysphagia, oropharyngeal phase: Secondary | ICD-10-CM | POA: Diagnosis not present

## 2015-06-26 DIAGNOSIS — F0390 Unspecified dementia without behavioral disturbance: Secondary | ICD-10-CM | POA: Diagnosis not present

## 2015-06-26 DIAGNOSIS — F339 Major depressive disorder, recurrent, unspecified: Secondary | ICD-10-CM | POA: Diagnosis not present

## 2015-06-29 DIAGNOSIS — F0390 Unspecified dementia without behavioral disturbance: Secondary | ICD-10-CM | POA: Diagnosis not present

## 2015-06-29 DIAGNOSIS — R627 Adult failure to thrive: Secondary | ICD-10-CM | POA: Diagnosis not present

## 2015-06-29 DIAGNOSIS — R1312 Dysphagia, oropharyngeal phase: Secondary | ICD-10-CM | POA: Diagnosis not present

## 2015-06-29 DIAGNOSIS — F339 Major depressive disorder, recurrent, unspecified: Secondary | ICD-10-CM | POA: Diagnosis not present

## 2015-06-29 DIAGNOSIS — M6281 Muscle weakness (generalized): Secondary | ICD-10-CM | POA: Diagnosis not present

## 2015-06-29 DIAGNOSIS — Z741 Need for assistance with personal care: Secondary | ICD-10-CM | POA: Diagnosis not present

## 2015-08-01 DIAGNOSIS — G301 Alzheimer's disease with late onset: Secondary | ICD-10-CM | POA: Diagnosis not present

## 2015-10-22 DIAGNOSIS — D649 Anemia, unspecified: Secondary | ICD-10-CM | POA: Diagnosis not present

## 2015-10-22 DIAGNOSIS — N189 Chronic kidney disease, unspecified: Secondary | ICD-10-CM | POA: Diagnosis not present

## 2015-10-27 DIAGNOSIS — Z66 Do not resuscitate: Secondary | ICD-10-CM | POA: Diagnosis not present

## 2015-10-27 DIAGNOSIS — G309 Alzheimer's disease, unspecified: Secondary | ICD-10-CM | POA: Diagnosis not present

## 2015-10-27 DIAGNOSIS — G8929 Other chronic pain: Secondary | ICD-10-CM | POA: Diagnosis not present

## 2015-10-27 DIAGNOSIS — Z515 Encounter for palliative care: Secondary | ICD-10-CM | POA: Diagnosis not present

## 2015-10-27 DIAGNOSIS — F0280 Dementia in other diseases classified elsewhere without behavioral disturbance: Secondary | ICD-10-CM | POA: Diagnosis not present

## 2015-11-05 DIAGNOSIS — M79674 Pain in right toe(s): Secondary | ICD-10-CM | POA: Diagnosis not present

## 2015-11-05 DIAGNOSIS — I70203 Unspecified atherosclerosis of native arteries of extremities, bilateral legs: Secondary | ICD-10-CM | POA: Diagnosis not present

## 2015-11-05 DIAGNOSIS — M79675 Pain in left toe(s): Secondary | ICD-10-CM | POA: Diagnosis not present

## 2015-11-05 DIAGNOSIS — B351 Tinea unguium: Secondary | ICD-10-CM | POA: Diagnosis not present

## 2015-11-11 DIAGNOSIS — Z23 Encounter for immunization: Secondary | ICD-10-CM | POA: Diagnosis not present

## 2015-11-15 DIAGNOSIS — G301 Alzheimer's disease with late onset: Secondary | ICD-10-CM | POA: Diagnosis not present

## 2016-02-05 DIAGNOSIS — G301 Alzheimer's disease with late onset: Secondary | ICD-10-CM | POA: Diagnosis not present

## 2016-03-22 DIAGNOSIS — M79675 Pain in left toe(s): Secondary | ICD-10-CM | POA: Diagnosis not present

## 2016-03-22 DIAGNOSIS — I70203 Unspecified atherosclerosis of native arteries of extremities, bilateral legs: Secondary | ICD-10-CM | POA: Diagnosis not present

## 2016-03-22 DIAGNOSIS — M79674 Pain in right toe(s): Secondary | ICD-10-CM | POA: Diagnosis not present

## 2016-03-22 DIAGNOSIS — L603 Nail dystrophy: Secondary | ICD-10-CM | POA: Diagnosis not present

## 2016-04-05 DIAGNOSIS — M6281 Muscle weakness (generalized): Secondary | ICD-10-CM | POA: Diagnosis not present

## 2016-04-05 DIAGNOSIS — Z741 Need for assistance with personal care: Secondary | ICD-10-CM | POA: Diagnosis not present

## 2016-04-07 DIAGNOSIS — M6281 Muscle weakness (generalized): Secondary | ICD-10-CM | POA: Diagnosis not present

## 2016-04-07 DIAGNOSIS — Z741 Need for assistance with personal care: Secondary | ICD-10-CM | POA: Diagnosis not present

## 2016-04-08 DIAGNOSIS — Z741 Need for assistance with personal care: Secondary | ICD-10-CM | POA: Diagnosis not present

## 2016-04-08 DIAGNOSIS — M6281 Muscle weakness (generalized): Secondary | ICD-10-CM | POA: Diagnosis not present

## 2016-04-11 DIAGNOSIS — M6281 Muscle weakness (generalized): Secondary | ICD-10-CM | POA: Diagnosis not present

## 2016-04-11 DIAGNOSIS — Z741 Need for assistance with personal care: Secondary | ICD-10-CM | POA: Diagnosis not present

## 2016-04-12 DIAGNOSIS — Z741 Need for assistance with personal care: Secondary | ICD-10-CM | POA: Diagnosis not present

## 2016-04-12 DIAGNOSIS — M6281 Muscle weakness (generalized): Secondary | ICD-10-CM | POA: Diagnosis not present

## 2016-04-13 DIAGNOSIS — M6281 Muscle weakness (generalized): Secondary | ICD-10-CM | POA: Diagnosis not present

## 2016-04-13 DIAGNOSIS — Z741 Need for assistance with personal care: Secondary | ICD-10-CM | POA: Diagnosis not present

## 2016-04-19 DIAGNOSIS — M6281 Muscle weakness (generalized): Secondary | ICD-10-CM | POA: Diagnosis not present

## 2016-04-19 DIAGNOSIS — Z741 Need for assistance with personal care: Secondary | ICD-10-CM | POA: Diagnosis not present

## 2016-04-20 DIAGNOSIS — Z741 Need for assistance with personal care: Secondary | ICD-10-CM | POA: Diagnosis not present

## 2016-04-20 DIAGNOSIS — M6281 Muscle weakness (generalized): Secondary | ICD-10-CM | POA: Diagnosis not present

## 2016-04-22 DIAGNOSIS — M6281 Muscle weakness (generalized): Secondary | ICD-10-CM | POA: Diagnosis not present

## 2016-04-22 DIAGNOSIS — Z741 Need for assistance with personal care: Secondary | ICD-10-CM | POA: Diagnosis not present

## 2016-04-23 DIAGNOSIS — G301 Alzheimer's disease with late onset: Secondary | ICD-10-CM | POA: Diagnosis not present

## 2016-07-23 DIAGNOSIS — G301 Alzheimer's disease with late onset: Secondary | ICD-10-CM | POA: Diagnosis not present

## 2016-08-26 DIAGNOSIS — B351 Tinea unguium: Secondary | ICD-10-CM | POA: Diagnosis not present

## 2016-08-26 DIAGNOSIS — R262 Difficulty in walking, not elsewhere classified: Secondary | ICD-10-CM | POA: Diagnosis not present

## 2016-10-17 DIAGNOSIS — G308 Other Alzheimer's disease: Secondary | ICD-10-CM | POA: Diagnosis not present

## 2016-10-31 DIAGNOSIS — G301 Alzheimer's disease with late onset: Secondary | ICD-10-CM | POA: Diagnosis not present

## 2016-10-31 DIAGNOSIS — Z23 Encounter for immunization: Secondary | ICD-10-CM | POA: Diagnosis not present

## 2016-11-25 DIAGNOSIS — I10 Essential (primary) hypertension: Secondary | ICD-10-CM | POA: Diagnosis not present

## 2016-11-25 DIAGNOSIS — R1310 Dysphagia, unspecified: Secondary | ICD-10-CM | POA: Diagnosis not present

## 2016-11-25 DIAGNOSIS — E785 Hyperlipidemia, unspecified: Secondary | ICD-10-CM | POA: Diagnosis not present

## 2016-11-25 DIAGNOSIS — K219 Gastro-esophageal reflux disease without esophagitis: Secondary | ICD-10-CM | POA: Diagnosis not present

## 2016-11-25 DIAGNOSIS — E119 Type 2 diabetes mellitus without complications: Secondary | ICD-10-CM | POA: Diagnosis not present

## 2017-01-06 DIAGNOSIS — E119 Type 2 diabetes mellitus without complications: Secondary | ICD-10-CM | POA: Diagnosis not present

## 2017-01-06 DIAGNOSIS — R627 Adult failure to thrive: Secondary | ICD-10-CM | POA: Diagnosis not present

## 2017-01-06 DIAGNOSIS — I10 Essential (primary) hypertension: Secondary | ICD-10-CM | POA: Diagnosis not present

## 2017-01-06 DIAGNOSIS — E785 Hyperlipidemia, unspecified: Secondary | ICD-10-CM | POA: Diagnosis not present

## 2017-01-06 DIAGNOSIS — D649 Anemia, unspecified: Secondary | ICD-10-CM | POA: Diagnosis not present

## 2017-01-13 DIAGNOSIS — I739 Peripheral vascular disease, unspecified: Secondary | ICD-10-CM | POA: Diagnosis not present

## 2017-01-13 DIAGNOSIS — B351 Tinea unguium: Secondary | ICD-10-CM | POA: Diagnosis not present

## 2017-01-13 DIAGNOSIS — R262 Difficulty in walking, not elsewhere classified: Secondary | ICD-10-CM | POA: Diagnosis not present

## 2017-02-08 DIAGNOSIS — E119 Type 2 diabetes mellitus without complications: Secondary | ICD-10-CM | POA: Diagnosis not present

## 2017-02-08 DIAGNOSIS — M1990 Unspecified osteoarthritis, unspecified site: Secondary | ICD-10-CM | POA: Diagnosis not present

## 2017-02-08 DIAGNOSIS — I10 Essential (primary) hypertension: Secondary | ICD-10-CM | POA: Diagnosis not present

## 2017-02-08 DIAGNOSIS — D649 Anemia, unspecified: Secondary | ICD-10-CM | POA: Diagnosis not present

## 2017-02-08 DIAGNOSIS — E785 Hyperlipidemia, unspecified: Secondary | ICD-10-CM | POA: Diagnosis not present

## 2017-03-09 DIAGNOSIS — E119 Type 2 diabetes mellitus without complications: Secondary | ICD-10-CM | POA: Diagnosis not present

## 2017-03-09 DIAGNOSIS — K219 Gastro-esophageal reflux disease without esophagitis: Secondary | ICD-10-CM | POA: Diagnosis not present

## 2017-03-09 DIAGNOSIS — E785 Hyperlipidemia, unspecified: Secondary | ICD-10-CM | POA: Diagnosis not present

## 2017-03-09 DIAGNOSIS — D649 Anemia, unspecified: Secondary | ICD-10-CM | POA: Diagnosis not present

## 2017-03-09 DIAGNOSIS — I10 Essential (primary) hypertension: Secondary | ICD-10-CM | POA: Diagnosis not present

## 2017-03-18 ENCOUNTER — Emergency Department: Payer: Medicare Other

## 2017-03-18 ENCOUNTER — Other Ambulatory Visit: Payer: Self-pay

## 2017-03-18 ENCOUNTER — Inpatient Hospital Stay
Admission: EM | Admit: 2017-03-18 | Discharge: 2017-03-20 | DRG: 193 | Disposition: A | Discharge status: Skilled Nursing Facility | Payer: Medicare Other | Attending: Internal Medicine | Admitting: Internal Medicine

## 2017-03-18 DIAGNOSIS — F419 Anxiety disorder, unspecified: Secondary | ICD-10-CM | POA: Diagnosis present

## 2017-03-18 DIAGNOSIS — R0902 Hypoxemia: Secondary | ICD-10-CM | POA: Diagnosis not present

## 2017-03-18 DIAGNOSIS — J1000 Influenza due to other identified influenza virus with unspecified type of pneumonia: Principal | ICD-10-CM | POA: Diagnosis present

## 2017-03-18 DIAGNOSIS — J9601 Acute respiratory failure with hypoxia: Secondary | ICD-10-CM | POA: Diagnosis not present

## 2017-03-18 DIAGNOSIS — Z853 Personal history of malignant neoplasm of breast: Secondary | ICD-10-CM | POA: Diagnosis not present

## 2017-03-18 DIAGNOSIS — Z66 Do not resuscitate: Secondary | ICD-10-CM | POA: Diagnosis present

## 2017-03-18 DIAGNOSIS — E860 Dehydration: Secondary | ICD-10-CM | POA: Diagnosis present

## 2017-03-18 DIAGNOSIS — I700 Atherosclerosis of aorta: Secondary | ICD-10-CM | POA: Diagnosis not present

## 2017-03-18 DIAGNOSIS — G301 Alzheimer's disease with late onset: Secondary | ICD-10-CM | POA: Diagnosis not present

## 2017-03-18 DIAGNOSIS — Y95 Nosocomial condition: Secondary | ICD-10-CM | POA: Diagnosis present

## 2017-03-18 DIAGNOSIS — K219 Gastro-esophageal reflux disease without esophagitis: Secondary | ICD-10-CM | POA: Diagnosis present

## 2017-03-18 DIAGNOSIS — J101 Influenza due to other identified influenza virus with other respiratory manifestations: Secondary | ICD-10-CM

## 2017-03-18 DIAGNOSIS — R7989 Other specified abnormal findings of blood chemistry: Secondary | ICD-10-CM

## 2017-03-18 DIAGNOSIS — E119 Type 2 diabetes mellitus without complications: Secondary | ICD-10-CM | POA: Diagnosis present

## 2017-03-18 DIAGNOSIS — R778 Other specified abnormalities of plasma proteins: Secondary | ICD-10-CM

## 2017-03-18 DIAGNOSIS — I10 Essential (primary) hypertension: Secondary | ICD-10-CM | POA: Diagnosis present

## 2017-03-18 DIAGNOSIS — R1312 Dysphagia, oropharyngeal phase: Secondary | ICD-10-CM | POA: Diagnosis not present

## 2017-03-18 DIAGNOSIS — J982 Interstitial emphysema: Secondary | ICD-10-CM

## 2017-03-18 DIAGNOSIS — J189 Pneumonia, unspecified organism: Secondary | ICD-10-CM

## 2017-03-18 DIAGNOSIS — Z741 Need for assistance with personal care: Secondary | ICD-10-CM | POA: Diagnosis not present

## 2017-03-18 DIAGNOSIS — R748 Abnormal levels of other serum enzymes: Secondary | ICD-10-CM | POA: Diagnosis present

## 2017-03-18 DIAGNOSIS — R000 Tachycardia, unspecified: Secondary | ICD-10-CM | POA: Diagnosis not present

## 2017-03-18 DIAGNOSIS — Z888 Allergy status to other drugs, medicaments and biological substances status: Secondary | ICD-10-CM

## 2017-03-18 DIAGNOSIS — M6281 Muscle weakness (generalized): Secondary | ICD-10-CM | POA: Diagnosis not present

## 2017-03-18 DIAGNOSIS — F0390 Unspecified dementia without behavioral disturbance: Secondary | ICD-10-CM

## 2017-03-18 DIAGNOSIS — E7849 Other hyperlipidemia: Secondary | ICD-10-CM | POA: Diagnosis not present

## 2017-03-18 DIAGNOSIS — Z7982 Long term (current) use of aspirin: Secondary | ICD-10-CM

## 2017-03-18 DIAGNOSIS — R0603 Acute respiratory distress: Secondary | ICD-10-CM

## 2017-03-18 DIAGNOSIS — J111 Influenza due to unidentified influenza virus with other respiratory manifestations: Secondary | ICD-10-CM | POA: Diagnosis present

## 2017-03-18 DIAGNOSIS — Z7401 Bed confinement status: Secondary | ICD-10-CM | POA: Diagnosis not present

## 2017-03-18 DIAGNOSIS — N3281 Overactive bladder: Secondary | ICD-10-CM | POA: Diagnosis not present

## 2017-03-18 DIAGNOSIS — R627 Adult failure to thrive: Secondary | ICD-10-CM | POA: Diagnosis not present

## 2017-03-18 DIAGNOSIS — Z79899 Other long term (current) drug therapy: Secondary | ICD-10-CM | POA: Diagnosis not present

## 2017-03-18 DIAGNOSIS — E876 Hypokalemia: Secondary | ICD-10-CM | POA: Diagnosis not present

## 2017-03-18 DIAGNOSIS — E46 Unspecified protein-calorie malnutrition: Secondary | ICD-10-CM | POA: Diagnosis not present

## 2017-03-18 DIAGNOSIS — R0602 Shortness of breath: Secondary | ICD-10-CM | POA: Diagnosis not present

## 2017-03-18 DIAGNOSIS — E785 Hyperlipidemia, unspecified: Secondary | ICD-10-CM | POA: Diagnosis present

## 2017-03-18 DIAGNOSIS — E870 Hyperosmolality and hypernatremia: Secondary | ICD-10-CM | POA: Diagnosis present

## 2017-03-18 DIAGNOSIS — J302 Other seasonal allergic rhinitis: Secondary | ICD-10-CM | POA: Diagnosis not present

## 2017-03-18 DIAGNOSIS — R262 Difficulty in walking, not elsewhere classified: Secondary | ICD-10-CM | POA: Diagnosis not present

## 2017-03-18 LAB — COMPREHENSIVE METABOLIC PANEL
ALBUMIN: 2.9 g/dL — AB (ref 3.5–5.0)
ALT: 17 U/L (ref 14–54)
AST: 34 U/L (ref 15–41)
Alkaline Phosphatase: 63 U/L (ref 38–126)
Anion gap: 15 (ref 5–15)
BUN: 68 mg/dL — AB (ref 6–20)
CHLORIDE: 108 mmol/L (ref 101–111)
CO2: 23 mmol/L (ref 22–32)
CREATININE: 1.47 mg/dL — AB (ref 0.44–1.00)
Calcium: 9.4 mg/dL (ref 8.9–10.3)
GFR calc Af Amer: 36 mL/min — ABNORMAL LOW (ref >60)
GFR, EST NON AFRICAN AMERICAN: 31 mL/min — AB (ref >60)
GLUCOSE: 156 mg/dL — AB (ref 65–99)
POTASSIUM: 3.6 mmol/L (ref 3.5–5.1)
Sodium: 146 mmol/L — ABNORMAL HIGH (ref 135–145)
Total Bilirubin: 0.8 mg/dL (ref 0.3–1.2)
Total Protein: 8.5 g/dL — ABNORMAL HIGH (ref 6.5–8.1)

## 2017-03-18 LAB — CBC WITH DIFFERENTIAL/PLATELET
Basophils Absolute: 0 10*3/uL (ref 0–0.1)
Basophils Relative: 0 %
Eosinophils Absolute: 0 10*3/uL (ref 0–0.7)
Eosinophils Relative: 0 %
HEMATOCRIT: 43.4 % (ref 35.0–47.0)
HEMOGLOBIN: 14.1 g/dL (ref 12.0–16.0)
LYMPHS ABS: 1.7 10*3/uL (ref 1.0–3.6)
LYMPHS PCT: 10 %
MCH: 30.4 pg (ref 26.0–34.0)
MCHC: 32.5 g/dL (ref 32.0–36.0)
MCV: 93.8 fL (ref 80.0–100.0)
MONO ABS: 1.6 10*3/uL — AB (ref 0.2–0.9)
MONOS PCT: 10 %
NEUTROS ABS: 13.8 10*3/uL — AB (ref 1.4–6.5)
NEUTROS PCT: 80 %
Platelets: 302 10*3/uL (ref 150–440)
RBC: 4.63 MIL/uL (ref 3.80–5.20)
RDW: 15 % — AB (ref 11.5–14.5)
WBC: 17.1 10*3/uL — ABNORMAL HIGH (ref 3.6–11.0)

## 2017-03-18 LAB — PROCALCITONIN: Procalcitonin: 0.26 ng/mL

## 2017-03-18 LAB — MRSA PCR SCREENING: MRSA by PCR: NEGATIVE

## 2017-03-18 LAB — INFLUENZA PANEL BY PCR (TYPE A & B)
INFLBPCR: NEGATIVE
Influenza A By PCR: POSITIVE — AB

## 2017-03-18 LAB — LACTIC ACID, PLASMA: Lactic Acid, Venous: 1.6 mmol/L (ref 0.5–1.9)

## 2017-03-18 LAB — TROPONIN I
Troponin I: 0.09 ng/mL (ref <0.03)
Troponin I: 0.08 ng/mL (ref <0.03)
Troponin I: 0.06 ng/mL (ref <0.03)
Troponin I: 0.05 ng/mL (ref <0.03)

## 2017-03-18 LAB — BRAIN NATRIURETIC PEPTIDE: B Natriuretic Peptide: 845 pg/mL — ABNORMAL HIGH (ref 0.0–100.0)

## 2017-03-18 LAB — GLUCOSE, CAPILLARY: Glucose-Capillary: 110 mg/dL — ABNORMAL HIGH (ref 65–99)

## 2017-03-18 MED ORDER — TRAVOPROST (BAK FREE) 0.004 % OP SOLN
1.0000 [drp] | Freq: Two times a day (BID) | OPHTHALMIC | Status: DC
  Administered 2017-03-18 – 2017-03-20 (×5): 1 [drp] via OPHTHALMIC
  Filled 2017-03-18: qty 2.5

## 2017-03-18 MED ORDER — SODIUM CHLORIDE 0.9 % IV BOLUS (SEPSIS)
500.0000 mL | Freq: Once | INTRAVENOUS | Status: AC
  Administered 2017-03-18: 500 mL via INTRAVENOUS

## 2017-03-18 MED ORDER — ENSURE SURGERY PO LIQD
237.0000 mL | Freq: Three times a day (TID) | ORAL | Status: DC
  Administered 2017-03-18 – 2017-03-20 (×2): 237 mL via ORAL

## 2017-03-18 MED ORDER — ASPIRIN 81 MG PO CHEW
81.0000 mg | CHEWABLE_TABLET | Freq: Every day | ORAL | Status: DC
  Administered 2017-03-18 – 2017-03-20 (×3): 81 mg via ORAL
  Filled 2017-03-18 (×3): qty 1

## 2017-03-18 MED ORDER — DONEPEZIL HCL 5 MG PO TABS
10.0000 mg | ORAL_TABLET | Freq: Every day | ORAL | Status: DC
  Administered 2017-03-18 – 2017-03-19 (×2): 10 mg via ORAL
  Filled 2017-03-18 (×3): qty 2

## 2017-03-18 MED ORDER — ENOXAPARIN SODIUM 40 MG/0.4ML ~~LOC~~ SOLN
40.0000 mg | SUBCUTANEOUS | Status: DC
  Administered 2017-03-18 – 2017-03-19 (×2): 40 mg via SUBCUTANEOUS
  Filled 2017-03-18 (×2): qty 0.4

## 2017-03-18 MED ORDER — SODIUM CHLORIDE 0.9 % IV SOLN
INTRAVENOUS | Status: DC
  Administered 2017-03-18 – 2017-03-19 (×2): via INTRAVENOUS

## 2017-03-18 MED ORDER — ACETAMINOPHEN 650 MG RE SUPP: 650.0000 mg | Freq: Four times a day (QID) | RECTAL | Status: DC | PRN

## 2017-03-18 MED ORDER — ADULT MULTIVITAMIN W/MINERALS CH
1.0000 | ORAL_TABLET | Freq: Every day | ORAL | Status: DC
  Administered 2017-03-18 – 2017-03-20 (×3): 1 via ORAL
  Filled 2017-03-18 (×3): qty 1

## 2017-03-18 MED ORDER — ONDANSETRON HCL 4 MG/2ML IJ SOLN
4.0000 mg | Freq: Four times a day (QID) | INTRAMUSCULAR | Status: DC | PRN
  Administered 2017-03-18: 12:00:00 4 mg via INTRAVENOUS
  Filled 2017-03-18: qty 2

## 2017-03-18 MED ORDER — SODIUM CHLORIDE 0.9 % IV SOLN
2.0000 g | INTRAVENOUS | Status: DC
  Administered 2017-03-19 – 2017-03-20 (×2): 2 g via INTRAVENOUS
  Filled 2017-03-18 (×3): qty 2

## 2017-03-18 MED ORDER — LOSARTAN POTASSIUM 50 MG PO TABS
50.0000 mg | ORAL_TABLET | Freq: Every day | ORAL | Status: DC
Start: 2017-03-18 — End: 2017-03-20
  Administered 2017-03-19 – 2017-03-20 (×2): 50 mg via ORAL
  Filled 2017-03-18 (×2): qty 1

## 2017-03-18 MED ORDER — ONDANSETRON HCL 4 MG PO TABS: 4.0000 mg | ORAL_TABLET | Freq: Four times a day (QID) | ORAL | Status: DC | PRN

## 2017-03-18 MED ORDER — ATORVASTATIN CALCIUM 20 MG PO TABS
10.0000 mg | ORAL_TABLET | Freq: Every day | ORAL | Status: DC
  Administered 2017-03-19: 10 mg via ORAL
  Filled 2017-03-18 (×2): qty 1

## 2017-03-18 MED ORDER — FUROSEMIDE 10 MG/ML IJ SOLN
40.0000 mg | Freq: Once | INTRAMUSCULAR | Status: AC
  Administered 2017-03-18: 40 mg via INTRAVENOUS
  Filled 2017-03-18: qty 4

## 2017-03-18 MED ORDER — OSELTAMIVIR PHOSPHATE 30 MG PO CAPS
30.0000 mg | ORAL_CAPSULE | Freq: Two times a day (BID) | ORAL | Status: DC
  Administered 2017-03-18 – 2017-03-20 (×4): 30 mg via ORAL
  Filled 2017-03-18 (×6): qty 1

## 2017-03-18 MED ORDER — OXYBUTYNIN CHLORIDE ER 5 MG PO TB24
5.0000 mg | ORAL_TABLET | Freq: Every day | ORAL | Status: DC
  Administered 2017-03-18 – 2017-03-20 (×3): 5 mg via ORAL
  Filled 2017-03-18 (×3): qty 1

## 2017-03-18 MED ORDER — MORPHINE SULFATE (PF) 2 MG/ML IV SOLN
2.0000 mg | Freq: Once | INTRAVENOUS | Status: AC
  Administered 2017-03-18: 2 mg via INTRAVENOUS
  Filled 2017-03-18: qty 1

## 2017-03-18 MED ORDER — OSELTAMIVIR PHOSPHATE 30 MG PO CAPS
30.0000 mg | ORAL_CAPSULE | Freq: Once | ORAL | Status: AC
  Administered 2017-03-18: 30 mg via ORAL
  Filled 2017-03-18: qty 1

## 2017-03-18 MED ORDER — IOPAMIDOL (ISOVUE-370) INJECTION 76%
60.0000 mL | Freq: Once | INTRAVENOUS | Status: AC | PRN
  Administered 2017-03-18: 60 mL via INTRAVENOUS

## 2017-03-18 MED ORDER — SERTRALINE HCL 50 MG PO TABS
50.0000 mg | ORAL_TABLET | Freq: Every day | ORAL | Status: DC
  Administered 2017-03-18 – 2017-03-20 (×3): 50 mg via ORAL
  Filled 2017-03-18 (×3): qty 1

## 2017-03-18 MED ORDER — SODIUM CHLORIDE 0.9 % IV SOLN
2.0000 g | Freq: Once | INTRAVENOUS | Status: AC
  Administered 2017-03-18: 2 g via INTRAVENOUS
  Filled 2017-03-18: qty 2

## 2017-03-18 MED ORDER — SENNOSIDES-DOCUSATE SODIUM 8.6-50 MG PO TABS: 1.0000 | ORAL_TABLET | Freq: Every evening | ORAL | Status: DC | PRN

## 2017-03-18 MED ORDER — ACETAMINOPHEN 325 MG PO TABS: 650.0000 mg | ORAL_TABLET | Freq: Four times a day (QID) | ORAL | Status: DC | PRN

## 2017-03-18 MED ORDER — MEMANTINE HCL ER 7 MG PO CP24
21.0000 mg | ORAL_CAPSULE | Freq: Every day | ORAL | Status: DC
  Administered 2017-03-18 – 2017-03-20 (×3): 21 mg via ORAL
  Filled 2017-03-18 (×3): qty 3

## 2017-03-18 MED ORDER — VANCOMYCIN HCL IN DEXTROSE 1-5 GM/200ML-% IV SOLN
1000.0000 mg | Freq: Once | INTRAVENOUS | Status: AC
  Administered 2017-03-18: 1000 mg via INTRAVENOUS
  Filled 2017-03-18: qty 200

## 2017-03-18 MED ORDER — VANCOMYCIN HCL 500 MG IV SOLR
500.0000 mg | INTRAVENOUS | Status: DC
  Filled 2017-03-18: qty 500

## 2017-03-18 NOTE — ED Notes (Signed)
Patient transported to CT 

## 2017-03-18 NOTE — Progress Notes (Signed)
Pharmacy Antibiotic Note  Courtney Castillo is a 84 y.o. female admitted on 03/18/2017 with pneumonia.  Pharmacy has been consulted for Cefepime/Vancomycin dosing.  Plan:  Vancomycin 1 g IV x 1 in ED 3/2 ~ 08:00. Vancomycin 500 mg IV every 24 hours.  Goal trough 15-20 mcg/mL. Cefepime 2 g IV every 24 hours.  Weight: 101 lb (45.8 kg)   Temp (24hrs), Avg:97.7 F (36.5 C), Min:97.7 F (36.5 C), Max:97.7 F (36.5 C)  Recent Labs  Lab 03/18/17 0512 03/18/17 0636  WBC 17.1*  --   CREATININE 1.47*  --   LATICACIDVEN  --  1.6    CrCl cannot be calculated (Unknown ideal weight.).    Allergies  Allergen Reactions  . Sulfamethoxazole-Trimethoprim     REACTION: Unknown reaction it has been years since patient took it and she only knows it was a Sulfa drug she took.    Antimicrobials this admission: Cefepime 3/2 >>  Vancomycin 3/2 >>   Dose adjustments this admission: N/A  Microbiology results: 3/2 BCx: pending 3/2 MRSA PCR: pending 3/2 PCT: 0.26 3/2 Influenza A by PCR: Positive  Thank you for allowing pharmacy to be a part of this patient's care.  Olivia Canter, [facility] 03/18/2017 1:53 PM

## 2017-03-18 NOTE — ED Notes (Signed)
Attempted to call report. RN not available.

## 2017-03-18 NOTE — ED Triage Notes (Signed)
Pt from [facility] with shob. Per ems pt with 96% on 2lpm via [place]. Pt appears anxious. md at bedside.

## 2017-03-18 NOTE — Progress Notes (Signed)
CODE SEPSIS - PHARMACY COMMUNICATION  **Broad Spectrum Antibiotics should be administered within 1 hour of Sepsis diagnosis**  Time Code Sepsis Called/Page Received: 1840  Antibiotics Ordered: vanc/cefepime  Time of 1st antibiotic administration: 0713  Additional action taken by pharmacy:   If necessary, Name of Provider/Nurse Contacted:     Tobie Lords ,PharmD Clinical Pharmacist  03/18/2017  7:22 AM

## 2017-03-18 NOTE — NC FL2 (Signed)
[place] LEVEL OF CARE SCREENING TOOL     IDENTIFICATION  Patient Name: Courtney Castillo Birthdate: 01-Jun-1927 Sex: female Admission Date (Current Location): 03/18/2017  [place] and [place] Number:  Engineering geologist and Address:  [facility], [address] [place], [place] [address]      Provider Number: [id number]  Attending Physician Name and Address:  Bettey Costa, MD  Relative Name and Phone Number:  Annamary [name] [facility]) [phone]    Current Level of Care: Hospital Recommended Level of Care: [place] Prior Approval Number:    Date Approved/Denied:   PASRR Number: [id number]  Discharge Plan: SNF    Current Diagnoses: Patient Active Problem List   Diagnosis Date Noted  . Flu syndrome 03/18/2017  . Aortic valve stenosis 02/05/2014  . Dyspnea 01/08/2014  . Chronic back pain 09/27/2013  . History of fall 08/15/2012  . Laceration of face 08/15/2012  . Skin tear of upper arm without complication 09/98/3382  . Gum laceration 08/17/2011  . Aphthous ulcer of mouth 08/17/2011  . Overactive bladder 04/06/2011  . Muscle weakness (generalized) 02/21/2011  . Depression (emotion) 02/21/2011  . Pain in both knees 12/21/2010  . Knee pain 07/26/2010  . Chest wall pain 05/12/2010  . Breast cancer ([place]) 05/12/2010  . Back pain 05/12/2010  . ANEMIA 09/26/2007  . Dementia 09/17/2007  . Dizzy 02/20/2007  . POLYMYALGIA RHEUMATICA 08/22/2006  . ARTHROPATHY NOS, MULTIPLE SITES 07/19/2006  . Diabetes type 2, controlled ([place]) 07/11/2006  . Hyperlipidemia LDL goal <100 07/11/2006  . CATARACTS 07/11/2006  . Essential hypertension 07/11/2006  . ALLERGIC RHINITIS 07/11/2006  . REACTIVE AIRWAY DISEASE 07/11/2006  . GERD 07/11/2006  . DIVERTICULOSIS, COLON 07/11/2006  . OSTEOARTHRITIS 07/11/2006  . HX, PERSONAL, ARTHRITIS 07/10/2006  . Hx Breast cancer ([facility]) Right, Stage one 12/18/1998    Class: Stage 1     Orientation RESPIRATION BLADDER Height & Weight     Self, Place  Normal Incontinent Weight: 98 lb 7 oz (44.7 kg) Height:     BEHAVIORAL SYMPTOMS/MOOD NEUROLOGICAL BOWEL NUTRITION STATUS      Incontinent Diet(Dysphagia 3, Magic Cup TID)  AMBULATORY STATUS COMMUNICATION OF NEEDS Skin   Extensive Assist Verbally Normal                       Personal Care Assistance Level of Assistance  Bathing, Feeding, Dressing Bathing Assistance: Limited assistance Feeding assistance: Limited assistance Dressing Assistance: Limited assistance     Functional Limitations Info             SPECIAL CARE FACTORS FREQUENCY                       Contractures Contractures Info: Not present    Additional Factors Info  Code Status, Allergies, Psychotropic Code Status Info: DNR Allergies Info: Sulfamethoxazole-trimethoprim Psychotropic Info: Zoloft         Current Medications (03/18/2017):  This is the current hospital active medication list Current Facility-Administered Medications  Medication Dose Route Frequency Provider Last Rate Last Dose  . 0.9 %  sodium chloride infusion   Intravenous Continuous Pyreddy, Reatha Harps, MD 75 mL/hr at 03/18/17 1211    . acetaminophen (TYLENOL) tablet 650 mg  650 mg Oral Q6H PRN Saundra Shelling, MD       Or  . acetaminophen (TYLENOL) suppository 650 mg  650 mg Rectal Q6H PRN Saundra Shelling, MD      . aspirin chewable  tablet 81 mg  81 mg Oral Q breakfast Pyreddy, Reatha Harps, MD   81 mg at 03/18/17 1210  . atorvastatin (LIPITOR) tablet 10 mg  10 mg Oral q1800 Saundra Shelling, MD      . Derrill Memo ON 03/19/2017] ceFEPIme (MAXIPIME) 2 g in sodium chloride 0.9 % 100 mL IVPB  2 g Intravenous Q24H Pyreddy, Pavan, MD      . donepezil (ARICEPT) tablet 10 mg  10 mg Oral QHS Pyreddy, Pavan, MD      . enoxaparin (LOVENOX) injection 40 mg  40 mg Subcutaneous Q24H Pyreddy, Pavan, MD      . feeding supplement (ENSURE SURGERY) liquid 237 mL  237 mL Oral TID WC Pyreddy, Pavan,  MD      . losartan (COZAAR) tablet 50 mg  50 mg Oral Daily Pyreddy, Pavan, MD      . memantine (NAMENDA XR) 24 hr capsule 21 mg  21 mg Oral Daily Pyreddy, Pavan, MD   21 mg at 03/18/17 1211  . multivitamin with minerals tablet 1 tablet  1 tablet Oral Daily Saundra Shelling, MD   1 tablet at 03/18/17 1211  . ondansetron (ZOFRAN) tablet 4 mg  4 mg Oral Q6H PRN Saundra Shelling, MD       Or  . ondansetron (ZOFRAN) injection 4 mg  4 mg Intravenous Q6H PRN Saundra Shelling, MD   4 mg at 03/18/17 1217  . oseltamivir (TAMIFLU) capsule 30 mg  30 mg Oral BID Pyreddy, Reatha Harps, MD      . oxybutynin (DITROPAN-XL) 24 hr tablet 5 mg  5 mg Oral Daily Pyreddy, Pavan, MD   5 mg at 03/18/17 1211  . senna-docusate (Senokot-S) tablet 1 tablet  1 tablet Oral QHS PRN Saundra Shelling, MD      . sertraline (ZOLOFT) tablet 50 mg  50 mg Oral Daily Pyreddy, Reatha Harps, MD   50 mg at 03/18/17 1210  . Travoprost (BAK Free) (TRAVATAN) 0.004 % ophthalmic solution SOLN 1 drop  1 drop Both Eyes BID Saundra Shelling, MD   1 drop at 03/18/17 1211  . [START ON 03/19/2017] vancomycin (VANCOCIN) 500 mg in sodium chloride 0.9 % 100 mL IVPB  500 mg Intravenous Q24H Saundra Shelling, MD         Discharge Medications: Please see discharge summary for a list of discharge medications.  Relevant Imaging Results:  Relevant Lab Results:   Additional Information SS# [id number]  Zettie Pho, LCSW

## 2017-03-18 NOTE — Progress Notes (Signed)
Patient admitted this a.m. by M.D. physician. Agree with current plan by admitting physician.

## 2017-03-18 NOTE — ED Notes (Signed)
Date and time results received: 03/18/17 0554  Test: Troponin Critical Value: 0.09  Name of Provider Notified: R[place]ark  Orders Received? Or Actions Taken?: None at this time

## 2017-03-18 NOTE — Clinical Social Work Note (Signed)
CSW aware via chart review that this patient admitted from [facility]. The CSW will assess when able.  Santiago Bumpers, MSW, Latanya Presser [phone]

## 2017-03-18 NOTE — ED Provider Notes (Signed)
[facility] Emergency Department Provider Note  ____________________________________________   First MD Initiated Contact with Patient 03/18/17 [phone]     (approximate)  I have reviewed the triage vital signs and the nursing notes.   HISTORY  Chief Complaint Shortness of Breath  Level 5 exemption history limited by the patient's dementia  HPI Courtney Castillo is a 84 y.o. female who comes to the emergency department via EMS with shortness of breath.  Apparently when EMS arrived the patient had an oxygen saturation in the mid 80s.  She may have been febrile as well.  The patient herself has no complaints although she has severe dementia.  Past Medical History:  Diagnosis Date  . Allergy    allergic rhinitis/ vasometer  . Anemia   . Anxiety   . Arthritis    osteoarthritis knees  . Cancer [facility])    Hx of breast CA & recurrent breast CA 11/11  . Dementia 2012  . Diabetes mellitus    type II  . Diverticulosis of colon   . 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 GERD (gastroesophageal reflux disease)    hiatal hernia  . Hyperlipidemia   . Hypertension   . Lumbar spinal stenosis 05/2007    Patient Active Problem List   Diagnosis Date Noted  . Aortic valve stenosis 02/05/2014  . Dyspnea 01/08/2014  . Chronic back pain 09/27/2013  . History of fall 08/15/2012  . Laceration of face 08/15/2012  . Skin tear of upper arm without complication 93/81/8299  . Gum laceration 08/17/2011  . Aphthous ulcer of mouth 08/17/2011  . Overactive bladder 04/06/2011  . Muscle weakness (generalized) 02/21/2011  . Depression (emotion) 02/21/2011  . Pain in both knees 12/21/2010  . Knee pain 07/26/2010  . Chest wall pain 05/12/2010  . Breast cancer ([place]) 05/12/2010  . Back pain 05/12/2010  . ANEMIA 09/26/2007  . Dementia 09/17/2007  . Dizzy 02/20/2007  . POLYMYALGIA RHEUMATICA 08/22/2006  . ARTHROPATHY NOS, MULTIPLE SITES  07/19/2006  . Diabetes type 2, controlled ([place]) 07/11/2006  . Hyperlipidemia LDL goal <100 07/11/2006  . CATARACTS 07/11/2006  . Essential hypertension 07/11/2006  . ALLERGIC RHINITIS 07/11/2006  . REACTIVE AIRWAY DISEASE 07/11/2006  . GERD 07/11/2006  . DIVERTICULOSIS, COLON 07/11/2006  . OSTEOARTHRITIS 07/11/2006  . HX, PERSONAL, ARTHRITIS 07/10/2006  . Hx Breast cancer ([facility]) Right, Stage one 12/18/1998    Class: Stage 1    Past Surgical History:  Procedure Laterality Date  . ABDOMINAL HYSTERECTOMY  03/1997   BSO stage 1B adenocarcinoma of endometruim  . BACK SURGERY  2009  . CARPAL TUNNEL RELEASE    . EYE SURGERY     cataract extraction  . INCISIONAL BREAST BIOPSY  10/16/2009  . MASTECTOMY PARTIAL / LUMPECTOMY  12/28/1998   NL lumpectomy  . MASTECTOMY PARTIAL / LUMPECTOMY  01/07/2010   Re-excision for + margin  . MASTECTOMY W/ NODES PARTIAL  12/14/2009   [facility] Right, on  . SHOULDER SURGERY    . TYMPANOPLASTY  1996    Prior to Admission medications   Medication Sig Start Date End Date Taking? Authorizing Provider  aspirin 81 MG tablet Take 1 tablet (81 mg total) by mouth daily. with meal 05/10/11  Yes Tower, Wynelle Fanny, MD  atorvastatin (LIPITOR) 10 MG tablet Take 1 tablet (10 mg total) by mouth daily. 10/04/13  Yes Tower, Wynelle Fanny, MD  Blood Glucose Calibration (CARESENS CONTROL A) SOLN  Used as directed 12/18/12  Yes Tower, Wynelle Fanny, MD  Blood Glucose Monitoring Suppl ([place]) DEVI Use to check blood sugar once daily 12/18/12  Yes Tower, Wynelle Fanny, MD  Calcium-Vitamin A-Vitamin D (LIQUID CALCIUM PO) Take 120 mLs by mouth 2 (two) times daily.   Yes [provider]  [place] test strip TEST ONCE DAILY 08/08/13  Yes Tower, Wynelle Fanny, MD  donepezil (ARICEPT) 10 MG tablet Take 1 tablet (10 mg total) by mouth at bedtime. 06/04/12 03/18/17 Yes Tower, Wynelle Fanny, MD  feeding supplement (ENSURE IMMUNE HEALTH) LIQD Drink entire contents of 237 MLs [1-can] by  mouth twice daily. 12/26/11  Yes Tower, Wynelle Fanny, MD  LITE TOUCH LANCETS MISC Test blood sugar once daily for DM 250.0 12/18/12  Yes Tower, Roque Lias A, MD  losartan (COZAAR) 50 MG tablet Take 50 mg by mouth daily.   Yes [provider]  Memantine HCl ER 21 MG CP24 Take 21 mg by mouth daily. 04/04/14  Yes Tower, Wynelle Fanny, MD  Multiple Vitamin (MULTIVITAMIN) tablet Take 1 tablet by mouth daily. 05/10/11  Yes Tower, Wynelle Fanny, MD  oxybutynin (DITROPAN-XL) 5 MG 24 hr tablet Take 5 mg by mouth daily.   Yes [provider]  sertraline (ZOLOFT) 50 MG tablet Take 1 tablet (50 mg total) by mouth daily. 01/26/12 03/18/17 Yes Tower, Marne A, MD  travoprost, benzalkonium, (TRAVATAN) 0.004 % ophthalmic solution Place 1 drop into both eyes 2 (two) times daily. 02/21/12  Yes Tower, Wynelle Fanny, MD    Allergies Sulfamethoxazole-trimethoprim  Family History  Problem Relation Age of Onset  . Diabetes Mother   . Heart disease Mother        MI  . Kidney disease Father   . Hypertension Father     Social History Social History   Tobacco Use  . Smoking status: Never Smoker  . Smokeless tobacco: Never Used  Substance Use Topics  . Alcohol use: No  . Drug use: No    Review of Systems Level 5 exemption history limited by the patient's dementia ____________________________________________   PHYSICAL EXAM:  VITAL SIGNS: ED Triage Vitals [03/18/17 0511]  Enc Vitals Group     BP 117/76     Pulse Rate (!) 130     Resp (!) 26     Temp      Temp Source Oral     SpO2 (!) 89 %     Weight      Height      Head Circumference      Peak Flow      Pain Score      Pain Loc      Pain Edu?      Excl. in [place]?     Constitutional: Elevated respiratory rate and appears unwell Eyes: PERRL EOMI. Head: Atraumatic. Nose: No congestion/rhinnorhea. Mouth/Throat: No trismus Neck: No stridor.   Cardiovascular: Tachycardic rate, regular rhythm. Grossly normal heart sounds.  Good peripheral  circulation. Respiratory: Fibrotic sounding breath sounds with increased respiratory effort and accessory muscle use lungs sounds equal bilaterally Gastrointestinal: Soft nontender Musculoskeletal: No lower extremity edema   Neurologic:  . No gross focal neurologic deficits are appreciated. Skin:  Skin is warm, dry and intact. No rash noted. Psychiatric: Severe dementia  ____________________________________________   DIFFERENTIAL includes but not limited to  Pulmonary embolism, acute coronary syndrome, pneumonia, pneumothorax, influenza ____________________________________________   LABS (all labs ordered are listed, but only abnormal results are displayed)  Labs Reviewed  COMPREHENSIVE METABOLIC PANEL - Abnormal; Notable for the following components:      Result Value   Sodium 146 (*)    Glucose, Bld 156 (*)    BUN 68 (*)    Creatinine, Ser 1.47 (*)    Total Protein 8.5 (*)    Albumin 2.9 (*)    GFR calc non Af Amer 31 (*)    GFR calc Af Amer 36 (*)    All other components within normal limits  BRAIN NATRIURETIC PEPTIDE - Abnormal; Notable for the following components:   B Natriuretic Peptide 845.0 (*)    All other components within normal limits  TROPONIN I - Abnormal; Notable for the following components:   Troponin I 0.09 (*)    All other components within normal limits  CBC WITH DIFFERENTIAL/PLATELET - Abnormal; Notable for the following components:   WBC 17.1 (*)    RDW 15.0 (*)    Neutro Abs 13.8 (*)    Monocytes Absolute 1.6 (*)    All other components within normal limits  INFLUENZA PANEL BY PCR (TYPE A & B) - Abnormal; Notable for the following components:   Influenza A By PCR POSITIVE (*)    All other components within normal limits  CULTURE, BLOOD (ROUTINE X 2)  CULTURE, BLOOD (ROUTINE X 2)  URINALYSIS, COMPLETE (UACMP) WITH MICROSCOPIC  PROCALCITONIN  LACTIC ACID, PLASMA  LACTIC ACID, PLASMA    Lab work reviewed by me with a number of abnormalities.   Her troponin is positive.  Elevated beta natruretic peptide consistent with slight fluid overload.  She has acute kidney injury.  She is influenza A positive. __________________________________________  EKG   ____________________________________________  RADIOLOGY  Chest x-ray reviewed by me consistent with interstitial lung disease ____________________________________________   PROCEDURES  Procedure(s) performed: no  .Critical Care Performed by: Darel Hong, MD Authorized by: Darel Hong, MD   Critical care provider statement:    Critical care time (minutes):  35   Critical care time was exclusive of:  Separately billable procedures and treating other patients   Critical care was necessary to treat or prevent imminent or life-threatening deterioration of the following conditions:  Respiratory failure   Critical care was time spent personally by me on the following activities:  Development of treatment plan with patient or surrogate, discussions with consultants, evaluation of patient's response to treatment, examination of patient, obtaining history from patient or surrogate, ordering and performing treatments and interventions, ordering and review of laboratory studies, ordering and review of radiographic studies, pulse oximetry, re-evaluation of patient's condition and review of old charts    Critical Care performed: yes  Observation: no ____________________________________________   INITIAL IMPRESSION / ASSESSMENT AND PLAN / ED COURSE  Pertinent labs & imaging results that were available during my care of the patient were reviewed by me and considered in my medical decision making (see chart for details).  The patient arrives with elevated respiratory rate, tachycardic, and hypoxic.  She does come up with nasal cannula.  Lung sounds sound fibrotic bilaterally.  Differentials extremely broad but includes pneumonia versus influenza versus pulmonary  embolism.  Patient is influenza A positive and her chest x-ray is concerning for infiltrates.  She resides in a nursing home so in addition to Tamiflu I will cover her with cefepime and vancomycin.  I appreciate that she very well could be septic, however there is some suggestion of fluid overload with an elevated beta natruretic peptide and the patient is DO  NOT RESUSCITATE and DO NOT INTUBATE.  I initially gave her 500 cc of normal saline and I will give her another 500 cc now.  I appreciate that 30 cc per cake would be 1400 cc, however given the concern for pulmonary edema and that we cannot intubate the patient I believe that that degree of fluids are contraindicated.  She requires inpatient admission and I discussed with the hospitalist who has graciously agreed to admit the patient to his service.     ____________________________________________   FINAL CLINICAL IMPRESSION(S) / ED DIAGNOSES  Final diagnoses:  Influenza A  Acute respiratory distress  Elevated troponin      NEW MEDICATIONS STARTED DURING THIS VISIT:  New Prescriptions   No medications on file     Note:  This document was prepared using Dragon voice recognition software and may include unintentional dictation errors.     Darel Hong, MD 03/18/17 [phone]

## 2017-03-18 NOTE — ED Notes (Signed)
XR at bedside

## 2017-03-18 NOTE — H&P (Signed)
[place] [facility] [place] NAME: Courtney Castillo    MR#:  [id number]  DATE OF BIRTH:  1928/01/09  DATE OF ADMISSION:  03/18/2017  PRIMARY CARE PHYSICIAN: Patient, No Pcp Per   REQUESTING/REFERRING PHYSICIAN:   CHIEF COMPLAINT:   Chief Complaint  Patient presents with  . Shortness of Breath    HISTORY OF PRESENT ILLNESS: Courtney Castillo  is a 84 y.o. female with a known history of osteoarthritis, breast cancer, dementia, diabetes mellitus type 2, diverticular disease, hyperlipidemia, GERD, hypertension, lumbar spinal stenosis presented to the emergency room with difficulty breathing and cough and congestion.  Patient oxygen saturation was low she was put on oxygen via nasal cannula in the emergency room.  Patient came from [place] care facility.  Flu test was positive and patient was put on droplet isolation.  Patient received Tamiflu in the emergency room and was started on IV antibiotics.  Lactic acid is pending.  Cough is productive of yellowish phlegm.  No complaints of any chest pain.  PAST MEDICAL HISTORY:   Past Medical History:  Diagnosis Date  . Allergy    allergic rhinitis/ vasometer  . Anemia   . Anxiety   . Arthritis    osteoarthritis knees  . Cancer [facility])    Hx of breast CA & recurrent breast CA 11/11  . Dementia 2012  . Diabetes mellitus    type II  . Diverticulosis of colon   . 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 GERD (gastroesophageal reflux disease)    hiatal hernia  . Hyperlipidemia   . Hypertension   . Lumbar spinal stenosis 05/2007    PAST SURGICAL HISTORY:  Past Surgical History:  Procedure Laterality Date  . ABDOMINAL HYSTERECTOMY  03/1997   BSO stage 1B adenocarcinoma of endometruim  . BACK SURGERY  2009  . CARPAL TUNNEL RELEASE    . EYE SURGERY     cataract extraction  . INCISIONAL BREAST BIOPSY  10/16/2009  . MASTECTOMY PARTIAL / LUMPECTOMY   12/28/1998   NL lumpectomy  . MASTECTOMY PARTIAL / LUMPECTOMY  01/07/2010   Re-excision for + margin  . MASTECTOMY W/ NODES PARTIAL  12/14/2009   [facility] Right, on  . SHOULDER SURGERY    . TYMPANOPLASTY  1996    SOCIAL HISTORY:  Social History   Tobacco Use  . Smoking status: Never Smoker  . Smokeless tobacco: Never Used  Substance Use Topics  . Alcohol use: No    FAMILY HISTORY:  Family History  Problem Relation Age of Onset  . Diabetes Mother   . Heart disease Mother        MI  . Kidney disease Father   . Hypertension Father     DRUG ALLERGIES:  Allergies  Allergen Reactions  . Sulfamethoxazole-Trimethoprim     REACTION: Unknown reaction it has been years since patient took it and she only knows it was a Sulfa drug she took.    REVIEW OF SYSTEMS:   CONSTITUTIONAL: No fever, has fatigue and weakness.  EYES: No blurred or double vision.  EARS, NOSE, AND THROAT: No tinnitus or ear pain.  RESPIRATORY: Has cough, shortness of breath,  No wheezing or hemoptysis.  CARDIOVASCULAR: No chest pain, orthopnea, edema.  GASTROINTESTINAL: No nausea, vomiting, diarrhea or abdominal pain.  GENITOURINARY: No dysuria, hematuria.  ENDOCRINE: No polyuria, nocturia,  HEMATOLOGY: No anemia, easy bruising or bleeding SKIN: No  rash or lesion. MUSCULOSKELETAL: No joint pain or arthritis.   NEUROLOGIC: No tingling, numbness, weakness.  PSYCHIATRY: No anxiety or depression.   MEDICATIONS AT HOME:  Prior to Admission medications   Medication Sig Start Date End Date Taking? Authorizing Provider  aspirin 81 MG tablet Take 1 tablet (81 mg total) by mouth daily. with meal 05/10/11  Yes Tower, Wynelle Fanny, MD  atorvastatin (LIPITOR) 10 MG tablet Take 1 tablet (10 mg total) by mouth daily. 10/04/13  Yes Tower, Wynelle Fanny, MD  Blood Glucose Calibration (CARESENS CONTROL A) SOLN Used as directed 12/18/12  Yes Tower, Wynelle Fanny, MD  Blood Glucose Monitoring Suppl ([place]) DEVI Use to check  blood sugar once daily 12/18/12  Yes Tower, Wynelle Fanny, MD  Calcium-Vitamin A-Vitamin D (LIQUID CALCIUM PO) Take 120 mLs by mouth 2 (two) times daily.   Yes [provider]  [place] test strip TEST ONCE DAILY 08/08/13  Yes Tower, Wynelle Fanny, MD  donepezil (ARICEPT) 10 MG tablet Take 1 tablet (10 mg total) by mouth at bedtime. 06/04/12 03/18/17 Yes Tower, Wynelle Fanny, MD  feeding supplement (ENSURE IMMUNE HEALTH) LIQD Drink entire contents of 237 MLs [1-can] by mouth twice daily. 12/26/11  Yes Tower, Wynelle Fanny, MD  LITE TOUCH LANCETS MISC Test blood sugar once daily for DM 250.0 12/18/12  Yes Tower, Roque Lias A, MD  losartan (COZAAR) 50 MG tablet Take 50 mg by mouth daily.   Yes [provider]  Memantine HCl ER 21 MG CP24 Take 21 mg by mouth daily. 04/04/14  Yes Tower, Wynelle Fanny, MD  Multiple Vitamin (MULTIVITAMIN) tablet Take 1 tablet by mouth daily. 05/10/11  Yes Tower, Wynelle Fanny, MD  oxybutynin (DITROPAN-XL) 5 MG 24 hr tablet Take 5 mg by mouth daily.   Yes [provider]  sertraline (ZOLOFT) 50 MG tablet Take 1 tablet (50 mg total) by mouth daily. 01/26/12 03/18/17 Yes Tower, Marne A, MD  travoprost, benzalkonium, (TRAVATAN) 0.004 % ophthalmic solution Place 1 drop into both eyes 2 (two) times daily. 02/21/12  Yes Tower, Wynelle Fanny, MD      PHYSICAL EXAMINATION:   VITAL SIGNS: Blood pressure (!) 115/57, pulse 71, resp. rate (!) 29, weight 45.8 kg (101 lb), last menstrual period 01/18/2000, SpO2 95 %.  GENERAL:  84 y.o.-year-old patient lying in the bed in mild respiratory distress.  EYES: Pupils equal, round, reactive to light and accommodation. No scleral icterus. Extraocular muscles intact.  HEENT: Head atraumatic, normocephalic. Oropharynx dr and nasopharynx clear.  NECK:  Supple, no jugular venous distention. No thyroid enlargement, no tenderness.  LUNGS: Decreased breath sounds bilaterally, rales heard in right lung. No use of accessory muscles of respiration.   CARDIOVASCULAR: S1, S2 normal. No murmurs, rubs, or gallops.  ABDOMEN: Soft, nontender, nondistended. Bowel sounds present. No organomegaly or mass.  EXTREMITIES: No pedal edema, cyanosis, or clubbing.  NEUROLOGIC: Cranial nerves II through XII are intact. Muscle strength 5/5 in all extremities. Sensation intact. Gait not checked.  PSYCHIATRIC: The patient is alert and oriented x 2  SKIN: No obvious rash, lesion, or ulcer.   LABORATORY PANEL:   CBC Recent Labs  Lab 03/18/17 0512  WBC 17.1*  HGB 14.1  HCT 43.4  PLT 302  MCV 93.8  MCH 30.4  MCHC 32.5  RDW 15.0*  LYMPHSABS 1.7  MONOABS 1.6*  EOSABS 0.0  BASOSABS 0.0   ------------------------------------------------------------------------------------------------------------------  Chemistries  Recent Labs  Lab 03/18/17 0512  NA 146*  K  3.6  CL 108  CO2 23  GLUCOSE 156*  BUN 68*  CREATININE 1.47*  CALCIUM 9.4  AST 34  ALT 17  ALKPHOS 63  BILITOT 0.8   ------------------------------------------------------------------------------------------------------------------ CrCl cannot be calculated (Unknown ideal weight.). ------------------------------------------------------------------------------------------------------------------ No results for input(s): TSH, T4TOTAL, T3FREE, THYROIDAB in the last 72 hours.  Invalid input(s): FREET3   Coagulation profile No results for input(s): INR, PROTIME in the last 168 hours. ------------------------------------------------------------------------------------------------------------------- No results for input(s): DDIMER in the last 72 hours. -------------------------------------------------------------------------------------------------------------------  Cardiac Enzymes Recent Labs  Lab 03/18/17 0512  TROPONINI 0.09*   ------------------------------------------------------------------------------------------------------------------ Invalid input(s):  POCBNP  ---------------------------------------------------------------------------------------------------------------  Urinalysis    Component Value Date/Time   COLORURINE Yellow 12/04/2013 0508   COLORURINE AMBER (A) 12/21/2010 0120   APPEARANCEUR Clear 12/04/2013 0508   LABSPEC 1.011 12/04/2013 0508   PHURINE 5.0 12/04/2013 0508   PHURINE 5.5 12/21/2010 0120   GLUCOSEU Negative 12/04/2013 0508   HGBUR Negative 12/04/2013 0508   HGBUR MODERATE (A) 12/21/2010 0120   BILIRUBINUR Negative 12/04/2013 0508   KETONESUR Negative 12/04/2013 0508   KETONESUR TRACE (A) 12/21/2010 0120   PROTEINUR Negative 12/04/2013 0508   PROTEINUR 100 (A) 12/21/2010 0120   UROBILINOGEN 1.0 12/21/2010 0120   NITRITE Negative 12/04/2013 0508   NITRITE NEGATIVE 12/21/2010 0120   LEUKOCYTESUR Negative 12/04/2013 0508     RADIOLOGY: Dg Chest Port 1 View  Result Date: 03/18/2017 CLINICAL DATA:  Shortness of breath. EXAM: PORTABLE CHEST 1 VIEW COMPARISON:  Frontal and lateral views 01/05/2014. Lung bases from abdominal CT 11/13/2012 FINDINGS: Low lung volumes. Mild elevation of right hemidiaphragm. Unchanged heart size and mediastinal contours, partially obscured by low lung volumes. Peripheral and basilar reticular opacities suggesting pulmonary fibrosis. No confluent airspace disease. No large pleural effusion. No pneumothorax. No acute osseous abnormalities are seen. Surgical clips in the right axilla. IMPRESSION: Peripheral and basilar reticular opacities suggesting interstitial lung disease, slightly progressed radiographs 3 years prior. Degree of pulmonary edema could occur concurrently. Electronically Signed   By: Jeb Levering M.D.   On: 03/18/2017 05:54    EKG: Orders placed or performed during the hospital encounter of 03/18/17  . ED EKG  . ED EKG    IMPRESSION AND PLAN: 84 year old elderly female patient who is a resident of facility with history of osteoarthritis, diverticular disease,  hypertension, hyperlipidemia, breast cancer presented to the emergency room with shortness of breath, cough and congestion.  Admitting diagnosis 1.  Flu syndrome 2.  Healthcare associated pneumonia  3.hypoxia 4.  Dehydration 5.  Hypernatremia 6.  Elevated troponin could be secondary to demand ischemia  Treatment plan Admit patient to medical floor IV fluid hydration Oral Tamiflu Start patient on IV vancomycin and IV cefepime antibiotics  cycle troponin Follow-up electrolytes   All the records are reviewed and case discussed with ED provider. Management plans discussed with the patient, family and they are in agreement.  CODE STATUS:DNR    Code Status Orders  (From admission, onward)        Start     Ordered   03/18/17 0654  Do not attempt resuscitation (DNR)  Continuous    Question Answer Comment  In the event of cardiac or respiratory ARREST Do not call a "code blue"   In the event of cardiac or respiratory ARREST Do not perform Intubation, CPR, defibrillation or ACLS   In the event of cardiac or respiratory ARREST Use medication by any route, position, wound care, and other measures to relive pain and suffering. May  use oxygen, suction and manual treatment of airway obstruction as needed for comfort.      03/18/17 1443    Code Status History    Date Active Date Inactive Code Status Order ID Comments User Context   This patient has a current code status but no historical code status.    Advance Directive Documentation     Most Recent Value  Type of Advance Directive  Out of facility DNR (pink MOST or yellow form)  Pre-existing out of facility DNR order (yellow form or pink MOST form)  Yellow form placed in chart (order not valid for inpatient use)  "MOST" Form in Place?  No data       TOTAL TIME TAKING CARE OF THIS PATIENT: 57 minutes.    Saundra Shelling M.D on 03/18/2017 at 6:54 AM  Between 7am to 6pm - Pager - [phone]  After 6pm go to [email or web address] -  password [email or web address] [facility]  Office  [phone]  CC: Primary care physician; Patient, No Pcp Per

## 2017-03-18 NOTE — Clinical Social Work Note (Signed)
Clinical Social Work Assessment  Patient Details  Name: Courtney Castillo MRN: [id number] Date of Birth: 12/18/1927  Date of referral:  03/18/17               Reason for consult:  Facility Placement                Permission sought to share information with:  Facility Art therapist granted to share information::  Yes, Verbal Permission Granted  Name::        Agency::  [facility]  Relationship::     Contact Information:     Housing/Transportation Living arrangements for the past 2 months:  [place] of Information:  Medical Team, Power of [place] Patient Interpreter Needed:  None Criminal Activity/Legal Involvement Pertinent to Current Situation/Hospitalization:  No - Comment as needed Significant Relationships:  Warehouse manager, Friend, Other Family Members Lives with:  Facility Resident Do you feel safe going back to the place where you live?  Yes Need for family participation in patient care:  Yes (Comment)(Patient has dementia and is baseline A&O X2)  Care giving concerns:  Patient admitted from a facility   Social Worker assessment / plan:  CSW attempted to meet with the patient and family at bedside. No family was present, and the patient was sleeping soundly. According to the attending RN, the patient has been oriented x2 at best and intermittently alert since admission. The CSW contacted the patient's nephew, Annamary [name] by phone to discuss discharge planning. [name] indicated that he is currently out of town, and he asked about the patient's status. The CSW advised [name] that the patient is currently afebrile and was sleeping soundly. [name] confirmed that the patient is a LTC resident at [facility], and the plan is for her to return there when stable.  The CSW received confirmation from the facility that the patient can return when stable and has been a resident for 4 years. The CSW  will continue to follow for discharge facilitation.  Employment status:  Retired Nurse, adult PT Recommendations:  Not assessed at this time Information / Referral to community resources:     Patient/Family's Response to care:  The patient was sleeping. The patient's nephew was pleasant and thanked the CSW for contact.  Patient/Family's Understanding of and Emotional Response to Diagnosis, Current Treatment, and Prognosis:  The patient's nephew understands that the patient is not yet stable to discharge; however, he is in agreement with her returning to the facility of origin when she is stable.  Emotional Assessment Appearance:  Appears stated age Attitude/Demeanor/Rapport:  Lethargic Affect (typically observed):  (Patient was sleeping soundly.) Orientation:  (Patient was sleeping soundly. Baseline A&O x2) Alcohol / Substance use:  Never Used Psych involvement (Current and /or in the community):  No (Comment)  Discharge Needs  Concerns to be addressed:  Care Coordination, Discharge Planning Concerns Readmission within the last 30 days:  No Current discharge risk:  Chronically ill Barriers to Discharge:  Continued Medical Work up   [employer], LCSW 03/18/2017, 3:45 PM

## 2017-03-19 LAB — CBC
HEMATOCRIT: 36.5 % (ref 35.0–47.0)
HEMOGLOBIN: 11.9 g/dL — AB (ref 12.0–16.0)
MCH: 30.6 pg (ref 26.0–34.0)
MCHC: 32.7 g/dL (ref 32.0–36.0)
MCV: 93.8 fL (ref 80.0–100.0)
Platelets: 266 10*3/uL (ref 150–440)
RBC: 3.89 MIL/uL (ref 3.80–5.20)
RDW: 15.2 % — ABNORMAL HIGH (ref 11.5–14.5)
WBC: 9.1 10*3/uL (ref 3.6–11.0)

## 2017-03-19 LAB — BASIC METABOLIC PANEL
ANION GAP: 11 (ref 5–15)
BUN: 48 mg/dL — ABNORMAL HIGH (ref 6–20)
CHLORIDE: 117 mmol/L — AB (ref 101–111)
CO2: 23 mmol/L (ref 22–32)
Calcium: 8.6 mg/dL — ABNORMAL LOW (ref 8.9–10.3)
Creatinine, Ser: 0.84 mg/dL (ref 0.44–1.00)
GFR calc Af Amer: >60 mL/min (ref >60)
GLUCOSE: 98 mg/dL (ref 65–99)
POTASSIUM: 3.6 mmol/L (ref 3.5–5.1)
Sodium: 151 mmol/L — ABNORMAL HIGH (ref 135–145)

## 2017-03-19 LAB — PROCALCITONIN: Procalcitonin: 0.21 ng/mL

## 2017-03-19 MED ORDER — SIMETHICONE 80 MG PO CHEW
80.0000 mg | CHEWABLE_TABLET | Freq: Four times a day (QID) | ORAL | Status: DC | PRN
  Filled 2017-03-19: qty 1

## 2017-03-19 MED ORDER — SALINE SPRAY 0.65 % NA SOLN
2.0000 | NASAL | Status: DC | PRN
  Filled 2017-03-19: qty 44

## 2017-03-19 MED ORDER — DEXTROSE 5 % IV SOLN
INTRAVENOUS | Status: DC
  Administered 2017-03-19 (×2): via INTRAVENOUS

## 2017-03-19 NOTE — Evaluation (Signed)
Physical Therapy Evaluation Patient Details Name: Courtney Castillo MRN: [id number] DOB: Feb 02, 1927 Today's Date: 03/19/2017   History of Present Illness  Courtney Castillo 84yo white female who comes to [facility] on 3/2 from [facility] with hypoxia and DOE, pt found to be Flu positive and with PNA. Pt is a 4Y resident from SNF with advanced dementia. PMH: OA, BrCA, DMA, diverticulitis, HLD, GERD, HTN, Lumbar SPinal Stenosis. Per RN who spoke to facility 1DA, Pt sustained a fall infacility ~2 months ago and has larlge been bed bound since.   Clinical Impression  Pt admitted with above diagnosis. Pt currently with functional limitations due to the deficits listed below (see "PT Problem List"). Upon entry, the patient is received semirecumbent in bed, no family/caregiver present. The pt is awake and agreeable to participate. No acute distress noted at this time. The pt is alert and oriented to self, which is baseline, pleasant, conversational, and following simple commands consistently. Attempted to obtain history, but patient is with strong memory deficits and a difficult historian. Manual muscle testing screening reveals 4+/5 strength grossly in BUE. PLOF is unclear, however this date, the patient requires modA for bed mobility and HHA to supervision with standing-pivot transfer to chair. Patient is a long-term SNF resident and does not require a PT evaluation for DC planning. Plan per CSW is for patient to return to SNF once medically cleared. Pt will benefit from skilled PT intervention to increase independence and safety with basic mobility in preparation for discharge to the venue listed below.       Follow Up Recommendations SNF;Home health PT(return to [facility] with [facility] )    Equipment Recommendations  None recommended by PT    Recommendations for Other Services       Precautions / Restrictions Precautions Precautions: Fall Restrictions Weight Bearing Restrictions: No       Mobility  Bed Mobility Overal bed mobility: Needs Assistance Bed Mobility: Supine to Sit     Supine to sit: Mod assist        Transfers Overall transfer level: Needs assistance Equipment used: 1 person hand held assist Transfers: Sit to/from Stand;Stand Pivot Transfers Sit to Stand: Min guard Stand pivot transfers: Min assist;Min guard       General transfer comment: heavy verbal cues for safety  Ambulation/Gait Ambulation/Gait assistance: (not attempted at this time. )              Stairs            Wheelchair Mobility    Modified Rankin (Stroke Patients Only)       Balance Overall balance assessment: History of Falls;Mild deficits observed, not formally tested;Needs assistance Sitting-balance support: Bilateral upper extremity supported;Feet supported Sitting balance-Leahy Scale: Poor                                       Pertinent Vitals/Pain Pain Assessment: No/denies pain    Home Living Family/patient expects to be discharged to:: Skilled nursing facility                 Additional Comments: 4y resident from SNF with dementia.     Prior Function Level of Independence: Needs assistance   Gait / Transfers Assistance Needed: largely bed bound over the past few months   ADL's / Homemaking Assistance Needed: requires assistance d/t advanced dementia.        Hand Dominance  Extremity/Trunk Assessment   Upper Extremity Assessment Upper Extremity Assessment: (4+/5 grossly )         Cervical / Trunk Assessment Cervical / Trunk Assessment: (limited ability to sit uprigth for > 60 seconds. )  Communication      Cognition Arousal/Alertness: Awake/alert;Lethargic Behavior During Therapy: WFL for tasks assessed/performed Overall Cognitive Status: History of cognitive impairments - at baseline(Ox1 at baseline. )                                        General Comments      Exercises      Assessment/Plan    PT Assessment Patient needs continued PT services  PT Problem List Decreased strength;Decreased activity tolerance;Decreased mobility;Decreased balance;Decreased cognition       PT Treatment Interventions Functional mobility training;Therapeutic activities;Therapeutic exercise;Patient/family education    PT Goals (Current goals can be found in the Care Plan section)  Acute Rehab PT Goals PT Goal Formulation: Patient unable to participate in goal setting Time For Goal Achievement: 04/02/17    Frequency Min 2X/week   Barriers to discharge        Co-evaluation               AM-PAC PT "6 Clicks" Daily Activity  Outcome Measure Difficulty turning over in bed (including adjusting bedclothes, sheets and blankets)?: Unable Difficulty moving from lying on back to sitting on the side of the bed? : Unable Difficulty sitting down on and standing up from a chair with arms (e.g., wheelchair, bedside commode, etc,.)?: A Lot Help needed moving to and from a bed to chair (including a wheelchair)?: A Lot Help needed walking in hospital room?: Total Help needed climbing 3-5 steps with a railing? : Total 6 Click Score: 8    End of Session Equipment Utilized During Treatment: Oxygen Activity Tolerance: Patient tolerated treatment well;Patient limited by fatigue;Patient limited by lethargy Patient left: in chair;with chair alarm set;with call bell/phone within reach Nurse Communication: Mobility status(AF c RVR) PT Visit Diagnosis: Unsteadiness on feet (R26.81);Difficulty in walking, not elsewhere classified (R26.2);Muscle weakness (generalized) (M62.81)    Time: 4166-0630 PT Time Calculation (min) (ACUTE ONLY): 20 min   Charges:   PT Evaluation $PT Eval Moderate Complexity: 1 Mod     PT G Codes:        3:36 PM, 04-06-17 Etta Grandchild, PT, DPT Physical Therapist - [facility]  [phone] ([place])    [place]  C 2017-04-06, 3:31 PM

## 2017-03-19 NOTE — Progress Notes (Signed)
[facility] [place] NAME: Courtney Castillo    MR#:  [id number]  DATE OF BIRTH:  [birth year]  SUBJECTIVE:   patient with dementia  REVIEW OF SYSTEMS:    Unable to obtain dementia  Tolerating Diet: yes      DRUG ALLERGIES:   Allergies  Allergen Reactions  . Sulfamethoxazole-Trimethoprim     REACTION: Unknown reaction it has been years since patient took it and she only knows it was a Sulfa drug she took.    VITALS:  Blood pressure (!) 115/45, pulse 65, temperature 97.8 F (36.6 C), temperature source Oral, resp. rate 16, height 5\' 3"  (1.6 m), weight 44.7 kg (98 lb 7 oz), last menstrual period 01/18/2000, SpO2 100 %.  PHYSICAL EXAMINATION:  Constitutional: Appears well-developed and well-nourished. No distress. HENT: Normocephalic. Marland Kitchen Oropharynx is clear and moist.  Eyes: Conjunctivae and EOM are normal. PERRLA, no scleral icterus.  Neck: Normal ROM. Neck supple. No JVD. No tracheal deviation. CVS: RRR, S1/S2 +, no murmurs, no gallops, no carotid bruit.  Pulmonary: b/l rhonchii  Abdominal: Soft. BS +,  no distension, tenderness, rebound or guarding.  Musculoskeletal: Normal range of motion. No edema and no tenderness.  Neuro: Alert. CN 2-12 grossly intact. No focal deficits. Skin: Skin is warm and dry. No rash noted. Psychiatric: dementia.      LABORATORY PANEL:   CBC Recent Labs  Lab 03/19/17 0422  WBC 9.1  HGB 11.9*  HCT 36.5  PLT 266   ------------------------------------------------------------------------------------------------------------------  Chemistries  Recent Labs  Lab 03/18/17 0512 03/19/17 0422  NA 146* 151*  K 3.6 3.6  CL 108 117*  CO2 23 23  GLUCOSE 156* 98  BUN 68* 48*  CREATININE 1.47* 0.84  CALCIUM 9.4 8.6*  AST 34  --   ALT 17  --   ALKPHOS 63  --   BILITOT 0.8  --     ------------------------------------------------------------------------------------------------------------------  Cardiac Enzymes Recent Labs  Lab 03/18/17 0932 03/18/17 1533 03/18/17 2135  TROPONINI 0.08* 0.06* 0.05*   ------------------------------------------------------------------------------------------------------------------  RADIOLOGY:  Ct Angio Chest Pe W/cm &/or Wo Cm  Result Date: 03/18/2017 CLINICAL DATA:  84 year old female with shortness of breath. Anxious. EXAM: CT ANGIOGRAPHY CHEST WITH CONTRAST TECHNIQUE: Multidetector CT imaging of the chest was performed using the standard protocol during bolus administration of intravenous contrast. Multiplanar CT image reconstructions and MIPs were obtained to evaluate the vascular anatomy. CONTRAST:  41mL ISOVUE-370 IOPAMIDOL (ISOVUE-370) INJECTION 76% COMPARISON:  Portable chest radiographs 0523 hours today. Earlier chest radiographs. PET-CT 10/28/2009 FINDINGS: Cardiovascular: Excellent contrast bolus timing in the pulmonary arterial tree. Mild respiratory motion. No focal filling defect identified in the pulmonary arteries to suggest acute pulmonary embolism. Mild to moderate cardiomegaly. No pericardial effusion. Calcified aortic atherosclerosis. Calcified coronary artery atherosclerosis. Mediastinum/Nodes: Positive for pneumomediastinum most apparent along the anterior and right mediastinal contour. Trace associated deep soft tissue gas at the right thoracic inlet. Mildly increased subcarinal soft tissue may be reactive lymphadenopathy. Lungs/Pleura: No pneumothorax.  The major airways are patent. Based on the 2011 PET-CT there is chronic lung disease with possible pulmonary fibrosis. Superimposed confluent bilateral lower lobe opacity with air bronchograms resembling a mix of atelectasis and consolidation. Similar opacity in the lingula. Confluent posterior left upper lobe opacity. Peribronchial and subpleural right upper lung and  middle lobe opacity more resembles chronic pulmonary fibrosis. No pleural effusion. Upper Abdomen: No free air in the visible upper abdomen. Negative visible liver, spleen, and partially visible bowel  in the upper abdomen. Musculoskeletal: Osteopenia. Mild T10 compression fracture which appears stable since chest radiographs 01/05/2014. No acute osseous abnormality identified. Review of the MIP images confirms the above findings. IMPRESSION: 1.  Negative for acute pulmonary embolus. 2. Bilateral pneumonia with bilateral lower lobe, left lingula, and developing left upper lobe consolidation. Suspect underlying chronic pulmonary fibrosis. No pleural effusion. 3. Pneumomediastinum, without associated pneumothorax or upper abdomen pneumoperitoneum. This pneumomediastinum is likely benign and self-limited, such as due to recent forceful cough or Valsalva. 4. Cardiomegaly.  Aortic Atherosclerosis (ICD10-I70.0). 5. Chronic T10 compression fracture. Electronically Signed   By: Genevie Ann M.D.   On: 03/18/2017 07:00   Dg Chest Port 1 View  Result Date: 03/18/2017 CLINICAL DATA:  Shortness of breath. EXAM: PORTABLE CHEST 1 VIEW COMPARISON:  Frontal and lateral views 01/05/2014. Lung bases from abdominal CT 11/13/2012 FINDINGS: Low lung volumes. Mild elevation of right hemidiaphragm. Unchanged heart size and mediastinal contours, partially obscured by low lung volumes. Peripheral and basilar reticular opacities suggesting pulmonary fibrosis. No confluent airspace disease. No large pleural effusion. No pneumothorax. No acute osseous abnormalities are seen. Surgical clips in the right axilla. IMPRESSION: Peripheral and basilar reticular opacities suggesting interstitial lung disease, slightly progressed radiographs 3 years prior. Degree of pulmonary edema could occur concurrently. Electronically Signed   By: Jeb Levering M.D.   On: 03/18/2017 05:54     ASSESSMENT AND PLAN:   84 year old female with a history of severe  dementia, diabetes who presented to the emergency room with shortness of breath.  1. Acute hypoxic respiratory failure in the setting of influenza A and HCAP Wean oxygen as tolerated  2. Influenza A: Continue Tamiflu today is day 2 of 5  3. HCAP: DC vancomycin continue cefepime  4. Dementia: Continue donepezil and Namenda  5. Hypernatremia from poor by mouth intake: Change fluids to D5 and repeat sodium in a.m. 6. Essential hypertension: Continue Cozaar    PT evaluation for discharge planning         Management plans discussed with the nursing CODE STATUS: DNR  TOTAL TIME TAKING CARE OF THIS PATIENT: 30 minutes.     POSSIBLE D/C 1-2 days, DEPENDING ON CLINICAL CONDITION.   Marjoria Mancillas M.D on 03/19/2017 at 8:18 AM  Between 7am to 6pm - Pager - [phone] After 6pm go to [email or web address] - password [email or web address] [place] [facility]  Office  [phone]  CC: Primary care physician; Patient, No Pcp Per  Note: This dictation was prepared with Dragon dictation along with smaller phrase technology. Any transcriptional errors that result from this process are unintentional.

## 2017-03-20 DIAGNOSIS — J302 Other seasonal allergic rhinitis: Secondary | ICD-10-CM | POA: Diagnosis not present

## 2017-03-20 DIAGNOSIS — J982 Interstitial emphysema: Secondary | ICD-10-CM | POA: Diagnosis not present

## 2017-03-20 DIAGNOSIS — E46 Unspecified protein-calorie malnutrition: Secondary | ICD-10-CM | POA: Diagnosis not present

## 2017-03-20 DIAGNOSIS — G301 Alzheimer's disease with late onset: Secondary | ICD-10-CM | POA: Diagnosis not present

## 2017-03-20 DIAGNOSIS — J189 Pneumonia, unspecified organism: Secondary | ICD-10-CM | POA: Diagnosis not present

## 2017-03-20 DIAGNOSIS — I10 Essential (primary) hypertension: Secondary | ICD-10-CM | POA: Diagnosis not present

## 2017-03-20 DIAGNOSIS — M069 Rheumatoid arthritis, unspecified: Secondary | ICD-10-CM | POA: Diagnosis not present

## 2017-03-20 DIAGNOSIS — R05 Cough: Secondary | ICD-10-CM | POA: Diagnosis not present

## 2017-03-20 DIAGNOSIS — R0603 Acute respiratory distress: Secondary | ICD-10-CM | POA: Diagnosis not present

## 2017-03-20 DIAGNOSIS — B372 Candidiasis of skin and nail: Secondary | ICD-10-CM | POA: Diagnosis not present

## 2017-03-20 DIAGNOSIS — R1312 Dysphagia, oropharyngeal phase: Secondary | ICD-10-CM | POA: Diagnosis not present

## 2017-03-20 DIAGNOSIS — E7849 Other hyperlipidemia: Secondary | ICD-10-CM | POA: Diagnosis not present

## 2017-03-20 DIAGNOSIS — R627 Adult failure to thrive: Secondary | ICD-10-CM | POA: Diagnosis not present

## 2017-03-20 DIAGNOSIS — K219 Gastro-esophageal reflux disease without esophagitis: Secondary | ICD-10-CM | POA: Diagnosis not present

## 2017-03-20 DIAGNOSIS — E785 Hyperlipidemia, unspecified: Secondary | ICD-10-CM | POA: Diagnosis not present

## 2017-03-20 DIAGNOSIS — E119 Type 2 diabetes mellitus without complications: Secondary | ICD-10-CM | POA: Diagnosis not present

## 2017-03-20 DIAGNOSIS — R262 Difficulty in walking, not elsewhere classified: Secondary | ICD-10-CM | POA: Diagnosis not present

## 2017-03-20 DIAGNOSIS — N3281 Overactive bladder: Secondary | ICD-10-CM | POA: Diagnosis not present

## 2017-03-20 DIAGNOSIS — J9601 Acute respiratory failure with hypoxia: Secondary | ICD-10-CM | POA: Diagnosis not present

## 2017-03-20 DIAGNOSIS — M6281 Muscle weakness (generalized): Secondary | ICD-10-CM | POA: Diagnosis not present

## 2017-03-20 DIAGNOSIS — Z741 Need for assistance with personal care: Secondary | ICD-10-CM | POA: Diagnosis not present

## 2017-03-20 DIAGNOSIS — Z7401 Bed confinement status: Secondary | ICD-10-CM | POA: Diagnosis not present

## 2017-03-20 LAB — BASIC METABOLIC PANEL
Anion gap: 9 (ref 5–15)
BUN: 29 mg/dL — AB (ref 6–20)
CALCIUM: 8.1 mg/dL — AB (ref 8.9–10.3)
CO2: 24 mmol/L (ref 22–32)
Chloride: 108 mmol/L (ref 101–111)
Creatinine, Ser: 0.68 mg/dL (ref 0.44–1.00)
GFR calc Af Amer: >60 mL/min (ref >60)
GLUCOSE: 95 mg/dL (ref 65–99)
Potassium: 2.7 mmol/L — CL (ref 3.5–5.1)
SODIUM: 141 mmol/L (ref 135–145)

## 2017-03-20 LAB — URINALYSIS, COMPLETE (UACMP) WITH MICROSCOPIC
Bilirubin Urine: NEGATIVE
GLUCOSE, UA: NEGATIVE mg/dL
HGB URINE DIPSTICK: NEGATIVE
Ketones, ur: NEGATIVE mg/dL
NITRITE: NEGATIVE
Protein, ur: NEGATIVE mg/dL
SPECIFIC GRAVITY, URINE: 1.019 (ref 1.005–1.030)
pH: 6 (ref 5.0–8.0)

## 2017-03-20 LAB — POTASSIUM: Potassium: 4.3 mmol/L (ref 3.5–5.1)

## 2017-03-20 LAB — PROCALCITONIN: PROCALCITONIN: 0.38 ng/mL

## 2017-03-20 LAB — MAGNESIUM: MAGNESIUM: 1.6 mg/dL — AB (ref 1.7–2.4)

## 2017-03-20 MED ORDER — DONEPEZIL HCL 10 MG PO TABS: 10.0000 mg | ORAL_TABLET | Freq: Every day | ORAL | 58 refills | Status: AC

## 2017-03-20 MED ORDER — CEFUROXIME AXETIL 500 MG PO TABS: 500.0000 mg | ORAL_TABLET | Freq: Two times a day (BID) | ORAL | 0 refills | Status: AC

## 2017-03-20 MED ORDER — POTASSIUM CHLORIDE CRYS ER 20 MEQ PO TBCR
40.0000 meq | EXTENDED_RELEASE_TABLET | Freq: Once | ORAL | Status: AC
Start: 2017-03-20 — End: 2017-03-20
  Administered 2017-03-20: 40 meq via ORAL
  Filled 2017-03-20: qty 2

## 2017-03-20 MED ORDER — MAGNESIUM SULFATE 2 GM/50ML IV SOLN
2.0000 g | Freq: Once | INTRAVENOUS | Status: AC
  Administered 2017-03-20: 2 g via INTRAVENOUS
  Filled 2017-03-20: qty 50

## 2017-03-20 MED ORDER — AZITHROMYCIN 500 MG PO TABS: 500.0000 mg | ORAL_TABLET | Freq: Every day | ORAL | 0 refills | Status: AC

## 2017-03-20 MED ORDER — SERTRALINE HCL 50 MG PO TABS: 50.0000 mg | ORAL_TABLET | Freq: Every day | ORAL | 62 refills | Status: AC

## 2017-03-20 MED ORDER — OSELTAMIVIR PHOSPHATE 30 MG PO CAPS: 30.0000 mg | ORAL_CAPSULE | Freq: Two times a day (BID) | ORAL | 0 refills | Status: AC

## 2017-03-20 MED ORDER — NYSTATIN 100000 UNIT/ML MT SUSP: 5.0000 mL | Freq: Four times a day (QID) | OROMUCOSAL | Status: DC

## 2017-03-20 MED ORDER — POTASSIUM CHLORIDE 10 MEQ/100ML IV SOLN
10.0000 meq | INTRAVENOUS | Status: AC
  Administered 2017-03-20 (×4): 10 meq via INTRAVENOUS
  Filled 2017-03-20 (×3): qty 100

## 2017-03-20 NOTE — Consult Note (Signed)
MEDICATION RELATED CONSULT NOTE - INITIAL   Pharmacy Consult for electrolytes Indication: hypokalemia  Allergies  Allergen Reactions  . Sulfamethoxazole-Trimethoprim     REACTION: Unknown reaction it has been years since patient took it and she only knows it was a Sulfa drug she took.    Patient Measurements: Height: 5\' 3"  (160 cm) Weight: 98 lb 7 oz (44.7 kg) IBW/kg (Calculated) : 52.4 Adjusted Body Weight:   Vital Signs: Temp: 97.9 F (36.6 C) (03/04 0357) Temp Source: Oral (03/04 0357) BP: 118/51 (03/04 0357) Pulse Rate: 100 (03/04 0357) Intake/Output from previous day: 03/03 0701 - 03/04 0700 In: 2191.3 [P.O.:450; I.V.:1541.3; IV Piggyback:200] Out: 250 [Urine:250] Intake/Output from this shift: No intake/output data recorded.  Labs: Recent Labs    03/18/17 0512 03/19/17 0422 03/20/17 0416  WBC 17.1* 9.1  --   HGB 14.1 11.9*  --   HCT 43.4 36.5  --   PLT 302 266  --   CREATININE 1.47* 0.84 0.68  MG  --   --  1.6*  ALBUMIN 2.9*  --   --   PROT 8.5*  --   --   AST 34  --   --   ALT 17  --   --   ALKPHOS 63  --   --   BILITOT 0.8  --   --    Estimated Creatinine Clearance: 35 mL/min (by C-G formula based on SCr of 0.68 mg/dL).   Microbiology: Recent Results (from the past 720 hour(s))  Blood Culture (routine x 2)     Status: None (Preliminary result)   Collection Time: 03/18/17  6:36 AM  Result Value Ref Range Status   Specimen Description BLOOD BLOOD LEFT HAND  Final   Special Requests   Final    BOTTLES DRAWN AEROBIC AND ANAEROBIC Blood Culture adequate volume   Culture   Final    NO GROWTH 2 DAYS Performed at [facility], [address], [place], [place] [address]    Report Status PENDING  Incomplete  Blood Culture (routine x 2)     Status: None (Preliminary result)   Collection Time: 03/18/17  6:36 AM  Result Value Ref Range Status   Specimen Description BLOOD RIGHT ANTECUBITAL  Final   Special Requests   Final    BOTTLES DRAWN  AEROBIC AND ANAEROBIC Blood Culture adequate volume   Culture   Final    NO GROWTH 2 DAYS Performed at [facility], [address] [place], [place] [address]    Report Status PENDING  Incomplete  MRSA PCR Screening     Status: None   Collection Time: 03/18/17  1:58 PM  Result Value Ref Range Status   MRSA by PCR NEGATIVE NEGATIVE Final    Comment:        The GeneXpert MRSA Assay (FDA approved for NASAL specimens only), is one component of a comprehensive MRSA colonization surveillance program. It is not intended to diagnose MRSA infection nor to guide or monitor treatment for MRSA infections. Performed at [facility], [address] [place], [place] [address]     Medical History: Past Medical History:  Diagnosis Date  . Allergy    allergic rhinitis/ vasometer  . Anemia   . Anxiety   . Arthritis    osteoarthritis knees  . Cancer [facility])    Hx of breast CA & recurrent breast CA 11/11  . Dementia 2012  . Diabetes mellitus    type II  . Diverticulosis of  colon   . 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 GERD (gastroesophageal reflux disease)    hiatal hernia  . Hyperlipidemia   . Hypertension   . Lumbar spinal stenosis 05/2007    Medications:  Scheduled:  . aspirin  81 mg Oral Q breakfast  . atorvastatin  10 mg Oral q1800  . donepezil  10 mg Oral QHS  . enoxaparin (LOVENOX) injection  40 mg Subcutaneous Q24H  . feeding supplement  237 mL Oral TID WC  . losartan  50 mg Oral Daily  . memantine  21 mg Oral Daily  . multivitamin with minerals  1 tablet Oral Daily  . nystatin  5 mL Mouth/Throat QID  . oseltamivir  30 mg Oral BID  . oxybutynin  5 mg Oral Daily  . sertraline  50 mg Oral Daily  . Travoprost (BAK Free)  1 drop Both Eyes BID    Assessment: Patient is a 84 year old female admitted for the flu. K this AM=2.7, Mg =1.6  Goal of Therapy:  Normalization of electrolytes  Plan:  K=4.2 pt  stable to d/c today. No further supplementation needed  Ramond Dial, Pharm.D, BCPS Clinical Pharmacist  03/20/2017,3:11 PM

## 2017-03-20 NOTE — Care Management Important Message (Signed)
Important Message  Patient Details  Name: Courtney Castillo MRN: [id number] Date of Birth: Apr 29, 1927   Medicare Important Message Given:  Yes    Shelbie Ammons, RN 03/20/2017, 6:35 AM

## 2017-03-20 NOTE — Progress Notes (Signed)
Pt left for [facility]  Via [facility]. Alert no distress. Report called earlier to shenique harris at [facility]. Sl d/cd  k 4.3 mag dose given

## 2017-03-20 NOTE — Plan of Care (Signed)
  Progressing Education: Knowledge of General Education information will improve 03/20/2017 2000 - Progressing by Rowe Robert, RN Health Behavior/Discharge Planning: Ability to manage health-related needs will improve 03/20/2017 2000 - Progressing by Rowe Robert, RN Clinical Measurements: Ability to maintain clinical measurements within normal limits will improve 03/20/2017 2000 - Progressing by Rowe Robert, RN Will remain free from infection 03/20/2017 2000 - Progressing by Rowe Robert, RN Diagnostic test results will improve 03/20/2017 2000 - Progressing by Rowe Robert, RN Respiratory complications will improve 03/20/2017 2000 - Progressing by Rowe Robert, RN Cardiovascular complication will be avoided 03/20/2017 2000 - Progressing by Rowe Robert, RN Activity: Risk for activity intolerance will decrease 03/20/2017 2000 - Progressing by Rowe Robert, RN Nutrition: Adequate nutrition will be maintained 03/20/2017 2000 - Progressing by Rowe Robert, RN Coping: Level of anxiety will decrease 03/20/2017 2000 - Progressing by Rowe Robert, RN Elimination: Will not experience complications related to bowel motility 03/20/2017 2000 - Progressing by Rowe Robert, RN Will not experience complications related to urinary retention 03/20/2017 2000 - Progressing by Rowe Robert, RN Pain Managment: General experience of comfort will improve 03/20/2017 2000 - Progressing by Rowe Robert, RN Safety: Ability to remain free from injury will improve 03/20/2017 2000 - Progressing by Rowe Robert, RN Skin Integrity: Risk for impaired skin integrity will decrease 03/20/2017 2000 - Progressing by Rowe Robert, RN

## 2017-03-20 NOTE — Discharge Summary (Signed)
[facility] [place] NAME: Courtney Castillo    MR#:  [id number]  DATE OF BIRTH:  [birth year]  DATE OF ADMISSION:  03/18/2017 ADMITTING PHYSICIAN: Saundra Shelling, MD  DATE OF DISCHARGE: 03/20/2017  PRIMARY CARE PHYSICIAN: MD at facility    ADMISSION DIAGNOSIS:  Acute respiratory distress [R06.03] Pneumomediastinum (HCC) [J98.2] Influenza A [J10.1] Healthcare-associated pneumonia [J18.9] Elevated troponin [R74.8]  DISCHARGE DIAGNOSIS:  Active Problems:   Flu syndrome   SECONDARY DIAGNOSIS:   Past Medical History:  Diagnosis Date  . Allergy    allergic rhinitis/ vasometer  . Anemia   . Anxiety   . Arthritis    osteoarthritis knees  . Cancer [facility])    Hx of breast CA & recurrent breast CA 11/11  . Dementia 2012  . Diabetes mellitus    type II  . Diverticulosis of colon   . 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 GERD (gastroesophageal reflux disease)    hiatal hernia  . Hyperlipidemia   . Hypertension   . Lumbar spinal stenosis 05/2007    HOSPITAL COURSE:    84 year old female with a history of severe dementia, diabetes who presented to the emergency room with shortness of breath.  1. Acute hypoxic respiratory failure in the setting of influenza A and HCAP Patient will require oxygen upon discharge  2. Influenza A: Continue Tamiflu today is day 3 of 5  3. HCAP:  patient will be transitioned to oral Ceftin and azithromycin upon discharge. She will need to complete treatment through 03/26/2017.   4. Dementia: Continue donepezil and Namenda  5. Hypernatremia from poor by mouth intake: Sodium level has improved.  6. Essential hypertension: Continue Cozaar  7. Hypokalemia: This was repleted prior to discharge   She would benefit from palliative care and speech consultation at facility.   DISCHARGE CONDITIONS AND DIET:   Patient is in stable condition for discharge on dysphagia 3  diet with thin liquids. Aspiration precautions are recommended.  CONSULTS OBTAINED:    DRUG ALLERGIES:   Allergies  Allergen Reactions  . Sulfamethoxazole-Trimethoprim     REACTION: Unknown reaction it has been years since patient took it and she only knows it was a Sulfa drug she took.    DISCHARGE MEDICATIONS:   Allergies as of 03/20/2017      Reactions   Sulfamethoxazole-trimethoprim    REACTION: Unknown reaction it has been years since patient took it and she only knows it was a Sulfa drug she took.      Medication List    TAKE these medications   aspirin 81 MG tablet Take 1 tablet (81 mg total) by mouth daily. with meal   atorvastatin 10 MG tablet Commonly known as:  LIPITOR Take 1 tablet (10 mg total) by mouth daily.   azithromycin 500 MG tablet Commonly known as:  ZITHROMAX Take 1 tablet (500 mg total) by mouth daily for 6 days. Take 1 tablet daily for 3 days.   CARESENS CONTROL A Soln Used as directed   [place] Use to check blood sugar once daily   cefUROXime 500 MG tablet Commonly known as:  CEFTIN Take 1 tablet (500 mg total) by mouth 2 (two) times daily with a meal.   CLEVER CHEK AUTO-CODE VOICE test strip Generic drug:  glucose blood TEST ONCE DAILY   donepezil 10 MG tablet Commonly known as:  ARICEPT Take 1 tablet (10  mg total) by mouth at bedtime.   feeding supplement Liqd Drink entire contents of 237 MLs [1-can] by mouth twice daily.   LIQUID CALCIUM PO Take 120 mLs by mouth 2 (two) times daily.   LITE TOUCH LANCETS Misc Test blood sugar once daily for DM 250.0   losartan 50 MG tablet Commonly known as:  COZAAR Take 50 mg by mouth daily.   Memantine HCl ER 21 MG Cp24 Take 21 mg by mouth daily.   multivitamin tablet Take 1 tablet by mouth daily.   oseltamivir 30 MG capsule Commonly known as:  TAMIFLU Take 1 capsule (30 mg total) by mouth 2 (two) times daily for 2 days.   oxybutynin 5 MG 24 hr tablet Commonly  known as:  DITROPAN-XL Take 5 mg by mouth daily.   sertraline 50 MG tablet Commonly known as:  ZOLOFT Take 1 tablet (50 mg total) by mouth daily.   travoprost (benzalkonium) 0.004 % ophthalmic solution Commonly known as:  TRAVATAN Place 1 drop into both eyes 2 (two) times daily.         Today   CHIEF COMPLAINT:  No acute events overnight   VITAL SIGNS:  Blood pressure (!) 118/51, pulse 100, temperature 97.9 F (36.6 C), temperature source Oral, resp. rate 16, height 5\' 3"  (1.6 m), weight 44.7 kg (98 lb 7 oz), last menstrual period 01/18/2000, SpO2 96 %.   REVIEW OF SYSTEMS:  Review of Systems  Unable to perform ROS: Dementia     PHYSICAL EXAMINATION:  GENERAL:  84 y.o.-year-old patient lying in the bed with no acute distress.  NECK:  Supple, no jugular venous distention. No thyroid enlargement, no tenderness.  LUNGS: Lower lobe rhonchi no wheezing or rales  CARDIOVASCULAR: S1, S2 normal. No murmurs, rubs, or gallops.  ABDOMEN: Soft, non-tender, non-distended. Bowel sounds present. No organomegaly or mass.  EXTREMITIES: No pedal edema, cyanosis, or clubbing.  PSYCHIATRIC: The patient is alert and oriented x name only not place or time due to dementia  SKIN: She has heel protectors on heels   DATA REVIEW:   CBC Recent Labs  Lab 03/19/17 0422  WBC 9.1  HGB 11.9*  HCT 36.5  PLT 266    Chemistries  Recent Labs  Lab 03/18/17 0512  03/20/17 0416  NA 146*   < > 141  K 3.6   < > 2.7*  CL 108   < > 108  CO2 23   < > 24  GLUCOSE 156*   < > 95  BUN 68*   < > 29*  CREATININE 1.47*   < > 0.68  CALCIUM 9.4   < > 8.1*  MG  --   --  1.6*  AST 34  --   --   ALT 17  --   --   ALKPHOS 63  --   --   BILITOT 0.8  --   --    < > = values in this interval not displayed.    Cardiac Enzymes Recent Labs  Lab 03/18/17 0932 03/18/17 1533 03/18/17 2135  TROPONINI 0.08* 0.06* 0.05*    Microbiology Results  @MICRORSLT48 @  RADIOLOGY:  No results  found.    Allergies as of 03/20/2017      Reactions   Sulfamethoxazole-trimethoprim    REACTION: Unknown reaction it has been years since patient took it and she only knows it was a Sulfa drug she took.      Medication List    TAKE these medications  aspirin 81 MG tablet Take 1 tablet (81 mg total) by mouth daily. with meal   atorvastatin 10 MG tablet Commonly known as:  LIPITOR Take 1 tablet (10 mg total) by mouth daily.   azithromycin 500 MG tablet Commonly known as:  ZITHROMAX Take 1 tablet (500 mg total) by mouth daily for 6 days. Take 1 tablet daily for 3 days.   CARESENS CONTROL A Soln Used as directed   [place] Use to check blood sugar once daily   cefUROXime 500 MG tablet Commonly known as:  CEFTIN Take 1 tablet (500 mg total) by mouth 2 (two) times daily with a meal.   CLEVER CHEK AUTO-CODE VOICE test strip Generic drug:  glucose blood TEST ONCE DAILY   donepezil 10 MG tablet Commonly known as:  ARICEPT Take 1 tablet (10 mg total) by mouth at bedtime.   feeding supplement Liqd Drink entire contents of 237 MLs [1-can] by mouth twice daily.   LIQUID CALCIUM PO Take 120 mLs by mouth 2 (two) times daily.   LITE TOUCH LANCETS Misc Test blood sugar once daily for DM 250.0   losartan 50 MG tablet Commonly known as:  COZAAR Take 50 mg by mouth daily.   Memantine HCl ER 21 MG Cp24 Take 21 mg by mouth daily.   multivitamin tablet Take 1 tablet by mouth daily.   oseltamivir 30 MG capsule Commonly known as:  TAMIFLU Take 1 capsule (30 mg total) by mouth 2 (two) times daily for 2 days.   oxybutynin 5 MG 24 hr tablet Commonly known as:  DITROPAN-XL Take 5 mg by mouth daily.   sertraline 50 MG tablet Commonly known as:  ZOLOFT Take 1 tablet (50 mg total) by mouth daily.   travoprost (benzalkonium) 0.004 % ophthalmic solution Commonly known as:  TRAVATAN Place 1 drop into both eyes 2 (two) times daily.          Management  plans discussed with the patient'a POA and he is in agreement. Stable for discharge   Patient should follow up with pcp at facility  CODE STATUS:     Code Status Orders  (From admission, onward)        Start     Ordered   03/18/17 0654  Do not attempt resuscitation (DNR)  Continuous    Question Answer Comment  In the event of cardiac or respiratory ARREST Do not call a "code blue"   In the event of cardiac or respiratory ARREST Do not perform Intubation, CPR, defibrillation or ACLS   In the event of cardiac or respiratory ARREST Use medication by any route, position, wound care, and other measures to relive pain and suffering. May use oxygen, suction and manual treatment of airway obstruction as needed for comfort.      03/18/17 4854    Code Status History    Date Active Date Inactive Code Status Order ID Comments User Context   This patient has a current code status but no historical code status.    Advance Directive Documentation     Most Recent Value  Type of Advance Directive  Out of facility DNR (pink MOST or yellow form)  Pre-existing out of facility DNR order (yellow form or pink MOST form)  Yellow form placed in chart (order not valid for inpatient use)  "MOST" Form in Place?  No data      TOTAL TIME TAKING CARE OF THIS PATIENT: 38 minutes.    Note: This dictation was  prepared with Dragon dictation along with smaller phrase technology. Any transcriptional errors that result from this process are unintentional.  Alexis Reber M.D on 03/20/2017 at 8:22 AM  Between 7am to 6pm - Pager - [phone] After 6pm go to [email or web address] - password [email or web address] [facility]  Office  [phone]  CC: Primary care physician; MD at [facility] health care

## 2017-03-20 NOTE — Progress Notes (Signed)
Patient is medically stable for D/C back to [facility] today. Per [facility] admissions coordinator at [facility] patient can come back today and is aware patient is positive for the flu. RN will call report and arrange EMS for transport. Clinical Education officer, museum (CSW) sent D/C orders to [facility] via [facility]. CSW contacted patient's nephew [name] and made him aware of above. Please reconsult if future social work needs arise. CSW signing off.   [employer], LCSW [phone]

## 2017-03-20 NOTE — Progress Notes (Addendum)
Please note patient is currently followed by out patient PALLIATIVE at [facility].  CSW [employer] made aware.  Plan is for discharge today. Patient information/updated notes  faxed to referral. Flo Shanks RN, BSN, [facility] [place], hospital liaison [phone]

## 2017-03-20 NOTE — Consult Note (Signed)
MEDICATION RELATED CONSULT NOTE - INITIAL   Pharmacy Consult for electrolytes Indication: hypokalemia  Allergies  Allergen Reactions  . Sulfamethoxazole-Trimethoprim     REACTION: Unknown reaction it has been years since patient took it and she only knows it was a Sulfa drug she took.    Patient Measurements: Height: 5\' 3"  (160 cm) Weight: 98 lb 7 oz (44.7 kg) IBW/kg (Calculated) : 52.4 Adjusted Body Weight:   Vital Signs: Temp: 97.9 F (36.6 C) (03/04 0357) Temp Source: Oral (03/04 0357) BP: 118/51 (03/04 0357) Pulse Rate: 100 (03/04 0357) Intake/Output from previous day: 03/03 0701 - 03/04 0700 In: 2191.3 [P.O.:450; I.V.:1541.3; IV Piggyback:200] Out: 250 [Urine:250] Intake/Output from this shift: No intake/output data recorded.  Labs: Recent Labs    03/18/17 0512 03/19/17 0422 03/20/17 0416  WBC 17.1* 9.1  --   HGB 14.1 11.9*  --   HCT 43.4 36.5  --   PLT 302 266  --   CREATININE 1.47* 0.84 0.68  MG  --   --  1.6*  ALBUMIN 2.9*  --   --   PROT 8.5*  --   --   AST 34  --   --   ALT 17  --   --   ALKPHOS 63  --   --   BILITOT 0.8  --   --    Estimated Creatinine Clearance: 35 mL/min (by C-G formula based on SCr of 0.68 mg/dL).   Microbiology: Recent Results (from the past 720 hour(s))  Blood Culture (routine x 2)     Status: None (Preliminary result)   Collection Time: 03/18/17  6:36 AM  Result Value Ref Range Status   Specimen Description BLOOD BLOOD LEFT HAND  Final   Special Requests   Final    BOTTLES DRAWN AEROBIC AND ANAEROBIC Blood Culture adequate volume   Culture   Final    NO GROWTH 2 DAYS Performed at [facility], [address] [place], [place] [address]    Report Status PENDING  Incomplete  Blood Culture (routine x 2)     Status: None (Preliminary result)   Collection Time: 03/18/17  6:36 AM  Result Value Ref Range Status   Specimen Description BLOOD RIGHT ANTECUBITAL  Final   Special Requests   Final    BOTTLES DRAWN  AEROBIC AND ANAEROBIC Blood Culture adequate volume   Culture   Final    NO GROWTH 2 DAYS Performed at [facility], [address] [place], [place] [address]    Report Status PENDING  Incomplete  MRSA PCR Screening     Status: None   Collection Time: 03/18/17  1:58 PM  Result Value Ref Range Status   MRSA by PCR NEGATIVE NEGATIVE Final    Comment:        The GeneXpert MRSA Assay (FDA approved for NASAL specimens only), is one component of a comprehensive MRSA colonization surveillance program. It is not intended to diagnose MRSA infection nor to guide or monitor treatment for MRSA infections. Performed at [facility], [address], [place], [place] [address]     Medical History: Past Medical History:  Diagnosis Date  . Allergy    allergic rhinitis/ vasometer  . Anemia   . Anxiety   . Arthritis    osteoarthritis knees  . Cancer [facility])    Hx of breast CA & recurrent breast CA 11/11  . Dementia 2012  . Diabetes mellitus    type II  . Diverticulosis of  colon   . 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 GERD (gastroesophageal reflux disease)    hiatal hernia  . Hyperlipidemia   . Hypertension   . Lumbar spinal stenosis 05/2007    Medications:  Scheduled:  . aspirin  81 mg Oral Q breakfast  . atorvastatin  10 mg Oral q1800  . donepezil  10 mg Oral QHS  . enoxaparin (LOVENOX) injection  40 mg Subcutaneous Q24H  . feeding supplement  237 mL Oral TID WC  . losartan  50 mg Oral Daily  . memantine  21 mg Oral Daily  . multivitamin with minerals  1 tablet Oral Daily  . nystatin  5 mL Mouth/Throat QID  . oseltamivir  30 mg Oral BID  . oxybutynin  5 mg Oral Daily  . potassium chloride  40 mEq Oral Once  . sertraline  50 mg Oral Daily  . Travoprost (BAK Free)  1 drop Both Eyes BID    Assessment: Patient is a 84 year old female admitted for the flu. K this AM=2.7, Mg =1.6  Goal of Therapy:   Normalization of electrolytes  Plan:  Pt already has ordered KCL 10 MEQ IV x 4 and KCL po 40 MEQ once. Will order Mg 2g IV once. Recheck at 1400.  Ramond Dial, Pharm.D, BCPS Clinical Pharmacist  03/20/2017,9:36 AM

## 2017-03-21 DIAGNOSIS — M069 Rheumatoid arthritis, unspecified: Secondary | ICD-10-CM | POA: Diagnosis not present

## 2017-03-21 DIAGNOSIS — R05 Cough: Secondary | ICD-10-CM | POA: Diagnosis not present

## 2017-03-21 DIAGNOSIS — B372 Candidiasis of skin and nail: Secondary | ICD-10-CM | POA: Diagnosis not present

## 2017-03-23 LAB — CULTURE, BLOOD (ROUTINE X 2)
CULTURE: NO GROWTH
Culture: NO GROWTH
Special Requests: ADEQUATE
Special Requests: ADEQUATE

## 2017-03-28 DIAGNOSIS — B372 Candidiasis of skin and nail: Secondary | ICD-10-CM | POA: Diagnosis not present

## 2017-03-28 DIAGNOSIS — R05 Cough: Secondary | ICD-10-CM | POA: Diagnosis not present

## 2017-04-11 ENCOUNTER — Telehealth: Payer: Self-pay | Admitting: Family Medicine

## 2017-04-11 NOTE — Telephone Encounter (Signed)
Chart updated

## 2017-04-11 NOTE — Telephone Encounter (Signed)
Thank you for letting me know - please mark her as deceased in the chart

## 2017-04-11 NOTE — Telephone Encounter (Signed)
Copied from [place]. Topic: Quick Communication - See Telephone Encounter >> Apr 11, 2017  3:06 PM Neva Seat wrote: Annamary [name] called to let the office know Ms.Schreck-pt passed away Thurs. 21, 2019 He asked if all calls and correspondence from the office can be stopped.

## 2017-04-17 DEATH — deceased
